# Patient Record
Sex: Female | Born: 1940 | Race: White | Hispanic: No | Smoking: Former smoker
Health system: Southern US, Community
[De-identification: ages and names within clinical notes are randomized; demographics above are authoritative.]

## PROBLEM LIST (undated history)

## (undated) DIAGNOSIS — R42 Dizziness and giddiness: Secondary | ICD-10-CM

## (undated) DIAGNOSIS — C44311 Basal cell carcinoma of skin of nose: Secondary | ICD-10-CM

## (undated) DIAGNOSIS — G43909 Migraine, unspecified, not intractable, without status migrainosus: Secondary | ICD-10-CM

## (undated) DIAGNOSIS — G629 Polyneuropathy, unspecified: Secondary | ICD-10-CM

## (undated) DIAGNOSIS — M797 Fibromyalgia: Secondary | ICD-10-CM

## (undated) DIAGNOSIS — S0590XA Unspecified injury of unspecified eye and orbit, initial encounter: Secondary | ICD-10-CM

## (undated) DIAGNOSIS — I10 Essential (primary) hypertension: Secondary | ICD-10-CM

## (undated) DIAGNOSIS — G8929 Other chronic pain: Secondary | ICD-10-CM

## (undated) DIAGNOSIS — M542 Cervicalgia: Secondary | ICD-10-CM

## (undated) DIAGNOSIS — R001 Bradycardia, unspecified: Secondary | ICD-10-CM

## (undated) DIAGNOSIS — M549 Dorsalgia, unspecified: Secondary | ICD-10-CM

## (undated) DIAGNOSIS — E785 Hyperlipidemia, unspecified: Secondary | ICD-10-CM

## (undated) DIAGNOSIS — I639 Cerebral infarction, unspecified: Secondary | ICD-10-CM

## (undated) DIAGNOSIS — F329 Major depressive disorder, single episode, unspecified: Secondary | ICD-10-CM

## (undated) DIAGNOSIS — R0789 Other chest pain: Secondary | ICD-10-CM

## (undated) DIAGNOSIS — R079 Chest pain, unspecified: Secondary | ICD-10-CM

## (undated) DIAGNOSIS — F32A Depression, unspecified: Secondary | ICD-10-CM

## (undated) DIAGNOSIS — E041 Nontoxic single thyroid nodule: Secondary | ICD-10-CM

## (undated) DIAGNOSIS — F419 Anxiety disorder, unspecified: Secondary | ICD-10-CM

## (undated) DIAGNOSIS — E049 Nontoxic goiter, unspecified: Secondary | ICD-10-CM

## (undated) HISTORY — DX: Migraine, unspecified, not intractable, without status migrainosus: G43.909

## (undated) HISTORY — DX: Anxiety disorder, unspecified: F41.9

## (undated) HISTORY — DX: Chest pain, unspecified: R07.9

## (undated) HISTORY — DX: Dizziness and giddiness: R42

## (undated) HISTORY — DX: Fibromyalgia: M79.7

## (undated) HISTORY — DX: Bradycardia, unspecified: R00.1

## (undated) HISTORY — DX: Other chronic pain: G89.29

## (undated) HISTORY — PX: CYSTECTOMY: SUR359

## (undated) HISTORY — PX: ABDOMINAL HYSTERECTOMY: SHX81

## (undated) HISTORY — PX: JOINT REPLACEMENT: SHX530

## (undated) HISTORY — PX: ROTATOR CUFF REPAIR: SHX139

## (undated) HISTORY — PX: TOTAL KNEE ARTHROPLASTY: SHX125

## (undated) HISTORY — DX: Other chest pain: R07.89

## (undated) HISTORY — PX: CHOLECYSTECTOMY: SHX55

## (undated) HISTORY — DX: Cerebral infarction, unspecified: I63.9

## (undated) HISTORY — PX: BICEPS TENDON REPAIR: SHX566

## (undated) HISTORY — PX: VESICOVAGINAL FISTULA CLOSURE W/ TAH: SUR271

## (undated) HISTORY — PX: CARDIAC CATHETERIZATION: SHX172

## (undated) HISTORY — PX: APPENDECTOMY: SHX54

## (undated) HISTORY — PX: BREAST LUMPECTOMY: SHX2

## (undated) HISTORY — PX: LASIK: SHX215

## (undated) HISTORY — DX: Polyneuropathy, unspecified: G62.9

## (undated) HISTORY — PX: REFRACTIVE SURGERY: SHX103

---

## 1998-08-20 DIAGNOSIS — I639 Cerebral infarction, unspecified: Secondary | ICD-10-CM

## 1998-08-20 HISTORY — DX: Cerebral infarction, unspecified: I63.9

## 2005-11-22 ENCOUNTER — Emergency Department (HOSPITAL_COMMUNITY): Admission: EM | Admit: 2005-11-22 | Discharge: 2005-11-22 | Payer: Self-pay | Admitting: Family Medicine

## 2005-11-23 ENCOUNTER — Emergency Department (HOSPITAL_COMMUNITY): Admission: EM | Admit: 2005-11-23 | Discharge: 2005-11-23 | Payer: Self-pay | Admitting: Family Medicine

## 2005-11-23 ENCOUNTER — Ambulatory Visit (HOSPITAL_COMMUNITY): Admission: RE | Admit: 2005-11-23 | Discharge: 2005-11-23 | Payer: Self-pay | Admitting: Family Medicine

## 2006-09-18 ENCOUNTER — Ambulatory Visit (HOSPITAL_COMMUNITY): Admission: RE | Admit: 2006-09-18 | Discharge: 2006-09-18 | Payer: Self-pay | Admitting: General Practice

## 2007-09-10 ENCOUNTER — Emergency Department (HOSPITAL_COMMUNITY): Admission: EM | Admit: 2007-09-10 | Discharge: 2007-09-10 | Payer: Self-pay | Admitting: Family Medicine

## 2007-10-07 ENCOUNTER — Ambulatory Visit (HOSPITAL_COMMUNITY): Admission: RE | Admit: 2007-10-07 | Discharge: 2007-10-07 | Payer: Self-pay | Admitting: Orthopedic Surgery

## 2007-12-31 ENCOUNTER — Ambulatory Visit (HOSPITAL_COMMUNITY): Payer: Self-pay | Admitting: Psychiatry

## 2008-01-23 ENCOUNTER — Ambulatory Visit (HOSPITAL_COMMUNITY): Payer: Self-pay | Admitting: Psychiatry

## 2008-02-12 ENCOUNTER — Ambulatory Visit (HOSPITAL_COMMUNITY): Payer: Self-pay | Admitting: Licensed Clinical Social Worker

## 2008-02-18 ENCOUNTER — Ambulatory Visit (HOSPITAL_COMMUNITY): Payer: Self-pay | Admitting: Psychiatry

## 2008-03-17 ENCOUNTER — Ambulatory Visit (HOSPITAL_COMMUNITY): Payer: Self-pay | Admitting: Licensed Clinical Social Worker

## 2008-03-17 ENCOUNTER — Ambulatory Visit (HOSPITAL_COMMUNITY): Payer: Self-pay | Admitting: Psychiatry

## 2008-04-08 ENCOUNTER — Ambulatory Visit (HOSPITAL_COMMUNITY): Payer: Self-pay | Admitting: Licensed Clinical Social Worker

## 2008-04-28 ENCOUNTER — Ambulatory Visit (HOSPITAL_COMMUNITY): Payer: Self-pay | Admitting: Psychiatry

## 2008-06-28 ENCOUNTER — Ambulatory Visit (HOSPITAL_COMMUNITY): Payer: Self-pay | Admitting: Psychiatry

## 2009-02-09 ENCOUNTER — Encounter: Admission: RE | Admit: 2009-02-09 | Discharge: 2009-05-10 | Payer: Self-pay | Admitting: Orthopedic Surgery

## 2009-03-03 ENCOUNTER — Encounter: Admission: RE | Admit: 2009-03-03 | Discharge: 2009-03-03 | Payer: Self-pay | Admitting: Orthopedic Surgery

## 2009-05-30 ENCOUNTER — Ambulatory Visit: Payer: Self-pay | Admitting: Internal Medicine

## 2009-07-03 ENCOUNTER — Emergency Department (HOSPITAL_COMMUNITY): Admission: EM | Admit: 2009-07-03 | Discharge: 2009-07-03 | Payer: Self-pay | Admitting: Emergency Medicine

## 2009-09-17 ENCOUNTER — Encounter: Admission: RE | Admit: 2009-09-17 | Discharge: 2009-09-17 | Payer: Self-pay | Admitting: Neurology

## 2009-12-16 ENCOUNTER — Encounter: Admission: RE | Admit: 2009-12-16 | Discharge: 2009-12-16 | Payer: Self-pay | Admitting: Neurology

## 2010-09-27 ENCOUNTER — Emergency Department (HOSPITAL_COMMUNITY): Payer: Medicare Other

## 2010-09-27 ENCOUNTER — Emergency Department (HOSPITAL_COMMUNITY)
Admission: EM | Admit: 2010-09-27 | Discharge: 2010-09-27 | Disposition: A | Discharge status: Home / Self Care | Payer: Medicare Other | Attending: Emergency Medicine | Admitting: Emergency Medicine

## 2010-09-27 DIAGNOSIS — W06XXXA Fall from bed, initial encounter: Secondary | ICD-10-CM | POA: Insufficient documentation

## 2010-09-27 DIAGNOSIS — M25579 Pain in unspecified ankle and joints of unspecified foot: Secondary | ICD-10-CM | POA: Insufficient documentation

## 2010-09-27 DIAGNOSIS — Z794 Long term (current) use of insulin: Secondary | ICD-10-CM | POA: Insufficient documentation

## 2010-09-27 DIAGNOSIS — M25559 Pain in unspecified hip: Secondary | ICD-10-CM | POA: Insufficient documentation

## 2010-09-27 DIAGNOSIS — R110 Nausea: Secondary | ICD-10-CM | POA: Insufficient documentation

## 2010-09-27 DIAGNOSIS — I10 Essential (primary) hypertension: Secondary | ICD-10-CM | POA: Insufficient documentation

## 2010-09-27 DIAGNOSIS — M25569 Pain in unspecified knee: Secondary | ICD-10-CM | POA: Insufficient documentation

## 2010-09-27 DIAGNOSIS — Z8679 Personal history of other diseases of the circulatory system: Secondary | ICD-10-CM | POA: Insufficient documentation

## 2010-09-27 DIAGNOSIS — R079 Chest pain, unspecified: Secondary | ICD-10-CM | POA: Insufficient documentation

## 2010-09-27 DIAGNOSIS — M5030 Other cervical disc degeneration, unspecified cervical region: Secondary | ICD-10-CM | POA: Insufficient documentation

## 2010-09-27 DIAGNOSIS — G319 Degenerative disease of nervous system, unspecified: Secondary | ICD-10-CM | POA: Insufficient documentation

## 2010-09-27 DIAGNOSIS — S9000XA Contusion of unspecified ankle, initial encounter: Secondary | ICD-10-CM | POA: Insufficient documentation

## 2010-09-27 DIAGNOSIS — S8000XA Contusion of unspecified knee, initial encounter: Secondary | ICD-10-CM | POA: Insufficient documentation

## 2010-09-27 DIAGNOSIS — S20219A Contusion of unspecified front wall of thorax, initial encounter: Secondary | ICD-10-CM | POA: Insufficient documentation

## 2010-09-27 DIAGNOSIS — M25519 Pain in unspecified shoulder: Secondary | ICD-10-CM | POA: Insufficient documentation

## 2010-09-27 DIAGNOSIS — E119 Type 2 diabetes mellitus without complications: Secondary | ICD-10-CM | POA: Insufficient documentation

## 2010-10-03 ENCOUNTER — Emergency Department (HOSPITAL_COMMUNITY): Payer: Medicare Other

## 2010-10-03 ENCOUNTER — Observation Stay (HOSPITAL_COMMUNITY)
Admission: EM | Admit: 2010-10-03 | Discharge: 2010-10-06 | Disposition: A | Discharge status: Home Health | Payer: Medicare Other | Attending: Internal Medicine | Admitting: Internal Medicine

## 2010-10-03 DIAGNOSIS — F29 Unspecified psychosis not due to a substance or known physiological condition: Secondary | ICD-10-CM | POA: Insufficient documentation

## 2010-10-03 DIAGNOSIS — R0789 Other chest pain: Principal | ICD-10-CM | POA: Insufficient documentation

## 2010-10-03 DIAGNOSIS — R109 Unspecified abdominal pain: Secondary | ICD-10-CM | POA: Insufficient documentation

## 2010-10-03 DIAGNOSIS — T40605A Adverse effect of unspecified narcotics, initial encounter: Secondary | ICD-10-CM | POA: Insufficient documentation

## 2010-10-03 DIAGNOSIS — Z9181 History of falling: Secondary | ICD-10-CM | POA: Insufficient documentation

## 2010-10-03 DIAGNOSIS — E785 Hyperlipidemia, unspecified: Secondary | ICD-10-CM | POA: Insufficient documentation

## 2010-10-03 DIAGNOSIS — IMO0001 Reserved for inherently not codable concepts without codable children: Secondary | ICD-10-CM | POA: Insufficient documentation

## 2010-10-03 DIAGNOSIS — Y92009 Unspecified place in unspecified non-institutional (private) residence as the place of occurrence of the external cause: Secondary | ICD-10-CM | POA: Insufficient documentation

## 2010-10-03 DIAGNOSIS — E119 Type 2 diabetes mellitus without complications: Secondary | ICD-10-CM | POA: Insufficient documentation

## 2010-10-03 DIAGNOSIS — Z8673 Personal history of transient ischemic attack (TIA), and cerebral infarction without residual deficits: Secondary | ICD-10-CM | POA: Insufficient documentation

## 2010-10-03 DIAGNOSIS — Z4789 Encounter for other orthopedic aftercare: Secondary | ICD-10-CM | POA: Insufficient documentation

## 2010-10-03 DIAGNOSIS — Z794 Long term (current) use of insulin: Secondary | ICD-10-CM | POA: Insufficient documentation

## 2010-10-03 DIAGNOSIS — R4182 Altered mental status, unspecified: Secondary | ICD-10-CM | POA: Insufficient documentation

## 2010-10-03 DIAGNOSIS — R112 Nausea with vomiting, unspecified: Secondary | ICD-10-CM | POA: Insufficient documentation

## 2010-10-03 DIAGNOSIS — I10 Essential (primary) hypertension: Secondary | ICD-10-CM | POA: Insufficient documentation

## 2010-10-03 LAB — DIFFERENTIAL
Basophils Absolute: 0 10*3/uL (ref 0.0–0.1)
Basophils Absolute: 0 10*3/uL (ref 0.0–0.1)
Basophils Relative: 0 % (ref 0–1)
Basophils Relative: 0 % (ref 0–1)
Lymphocytes Relative: 11 % — ABNORMAL LOW (ref 12–46)
Monocytes Absolute: 0.4 10*3/uL (ref 0.1–1.0)
Neutro Abs: 9.6 10*3/uL — ABNORMAL HIGH (ref 1.7–7.7)
Neutro Abs: 7.1 10*3/uL (ref 1.7–7.7)
Neutrophils Relative %: 90 % — ABNORMAL HIGH (ref 43–77)
Neutrophils Relative %: 82 % — ABNORMAL HIGH (ref 43–77)

## 2010-10-03 LAB — TSH: TSH: 0.785 u[IU]/mL (ref 0.350–4.500)

## 2010-10-03 LAB — CBC
HCT: 40.8 % (ref 36.0–46.0)
Hemoglobin: 13.8 g/dL (ref 12.0–15.0)
MCH: 31.8 pg (ref 26.0–34.0)
Platelets: 256 10*3/uL (ref 150–400)
RBC: 4.66 MIL/uL (ref 3.87–5.11)
WBC: 10.6 10*3/uL — ABNORMAL HIGH (ref 4.0–10.5)
WBC: 8.6 10*3/uL (ref 4.0–10.5)

## 2010-10-03 LAB — COMPREHENSIVE METABOLIC PANEL
ALT: 11 U/L (ref 0–35)
Alkaline Phosphatase: 86 U/L (ref 39–117)
BUN: 12 mg/dL (ref 6–23)
CO2: 30 mEq/L (ref 19–32)
Calcium: 8.5 mg/dL (ref 8.4–10.5)
GFR calc non Af Amer: >60 mL/min (ref >60)
Glucose, Bld: 178 mg/dL — ABNORMAL HIGH (ref 70–99)
Potassium: 4.4 mEq/L (ref 3.5–5.1)
Sodium: 144 mEq/L (ref 135–145)
Total Protein: 5.7 g/dL — ABNORMAL LOW (ref 6.0–8.3)

## 2010-10-03 LAB — POCT I-STAT, CHEM 8
BUN: 19 mg/dL (ref 6–23)
Calcium, Ion: 1.13 mmol/L (ref 1.12–1.32)
Creatinine, Ser: 0.7 mg/dL (ref 0.4–1.2)
Sodium: 142 mEq/L (ref 135–145)

## 2010-10-03 LAB — RAPID URINE DRUG SCREEN, HOSP PERFORMED
Barbiturates: NOT DETECTED
Benzodiazepines: POSITIVE — AB
Cocaine: NOT DETECTED
Opiates: NOT DETECTED

## 2010-10-03 LAB — POCT CARDIAC MARKERS
CKMB, poc: <1 ng/mL — ABNORMAL LOW (ref 1.0–8.0)
Myoglobin, poc: 57.7 ng/mL (ref 12–200)
Myoglobin, poc: 64.5 ng/mL (ref 12–200)
Troponin i, poc: <0.05 ng/mL (ref 0.00–0.09)

## 2010-10-03 LAB — CK TOTAL AND CKMB (NOT AT ARMC)
CK, MB: 0.9 ng/mL (ref 0.3–4.0)
Relative Index: INVALID (ref 0.0–2.5)
Total CK: 52 U/L (ref 7–177)

## 2010-10-03 LAB — URINALYSIS, ROUTINE W REFLEX MICROSCOPIC
Bilirubin Urine: NEGATIVE
Ketones, ur: NEGATIVE mg/dL
Nitrite: NEGATIVE
Protein, ur: NEGATIVE mg/dL
Specific Gravity, Urine: 1.01 (ref 1.005–1.030)
Urobilinogen, UA: 0.2 mg/dL (ref 0.0–1.0)

## 2010-10-03 LAB — GLUCOSE, CAPILLARY: Glucose-Capillary: 223 mg/dL — ABNORMAL HIGH (ref 70–99)

## 2010-10-03 LAB — PROTIME-INR: INR: 0.94 (ref 0.00–1.49)

## 2010-10-03 LAB — CARDIAC PANEL(CRET KIN+CKTOT+MB+TROPI)
Relative Index: INVALID (ref 0.0–2.5)
Troponin I: <0.01 ng/mL (ref 0.00–0.06)

## 2010-10-03 LAB — LIPID PANEL
HDL: 48 mg/dL (ref >39)
Triglycerides: 52 mg/dL (ref <150)
VLDL: 10 mg/dL (ref 0–40)

## 2010-10-03 LAB — HEMOGLOBIN A1C: Hgb A1c MFr Bld: 7.9 % — ABNORMAL HIGH (ref <5.7)

## 2010-10-04 ENCOUNTER — Observation Stay (HOSPITAL_COMMUNITY): Payer: Medicare Other

## 2010-10-04 LAB — GLUCOSE, CAPILLARY
Glucose-Capillary: 134 mg/dL — ABNORMAL HIGH (ref 70–99)
Glucose-Capillary: 135 mg/dL — ABNORMAL HIGH (ref 70–99)
Glucose-Capillary: 220 mg/dL — ABNORMAL HIGH (ref 70–99)
Glucose-Capillary: 146 mg/dL — ABNORMAL HIGH (ref 70–99)
Glucose-Capillary: 176 mg/dL — ABNORMAL HIGH (ref 70–99)

## 2010-10-04 LAB — CARDIAC PANEL(CRET KIN+CKTOT+MB+TROPI)
CK, MB: 1 ng/mL (ref 0.3–4.0)
Relative Index: INVALID (ref 0.0–2.5)
Total CK: 51 U/L (ref 7–177)

## 2010-10-05 LAB — CBC
HCT: 42 % (ref 36.0–46.0)
MCHC: 33.1 g/dL (ref 30.0–36.0)
MCV: 95 fL (ref 78.0–100.0)
RDW: 12.9 % (ref 11.5–15.5)

## 2010-10-05 LAB — GLUCOSE, CAPILLARY: Glucose-Capillary: 109 mg/dL — ABNORMAL HIGH (ref 70–99)

## 2010-10-05 LAB — BASIC METABOLIC PANEL
BUN: 8 mg/dL (ref 6–23)
CO2: 31 mEq/L (ref 19–32)
Calcium: 8.5 mg/dL (ref 8.4–10.5)
GFR calc non Af Amer: >60 mL/min (ref >60)
Glucose, Bld: 168 mg/dL — ABNORMAL HIGH (ref 70–99)
Potassium: 4 mEq/L (ref 3.5–5.1)

## 2010-10-05 MED ORDER — IOHEXOL 300 MG/ML  SOLN
100.0000 mL | Freq: Once | INTRAMUSCULAR | Status: AC | PRN
  Administered 2010-10-05: 100 mL via INTRAVENOUS

## 2010-10-09 LAB — GLUCOSE, CAPILLARY: Glucose-Capillary: 298 mg/dL — ABNORMAL HIGH (ref 70–99)

## 2010-10-14 NOTE — Discharge Summary (Signed)
NAME:  Margaret Fischer, Margaret Fischer                 ACCOUNT NO.:  [id number]  MEDICAL RECORD NO.:  [id number]           PATIENT TYPE:  I  LOCATION:  3005                         FACILITY:  [facility]  PHYSICIAN:  Isidor Holts, M.D.  DATE OF BIRTH:  1940/09/16  DATE OF ADMISSION:  10/03/2010 DATE OF DISCHARGE:  10/06/2010                              DISCHARGE SUMMARY   PRIMARY PHYSICIAN:  Gerlene Burdock D. Ethelene Hal, M.D.  PRIMARY NEUROLOGIST:  Genene Churn. Love, M.D.  DISCHARGE DIAGNOSES: 1. Transient altered mental status/disorientation, likely secondary to     medication side effect. 2. History of recent fall/left lower rib cage pain, likely secondary     to rib contusion. 3. Insulin-requiring type 2 diabetes mellitus. 4. Dyslipidemia. 5. Hypertension. 6. History of transient ischemic attack. 7. Fibromyalgia/chronic pain syndrome. 8. Morbid obesity.  DISCHARGE MEDICATIONS: 1. Fluoxetine 60 mg p.o. daily. 2. Amlodipine 5 mg p.o. daily. 3. Aspirin 325 mg p.o. b.i.d. 4. Clonazepam 2 mg p.o. p.r.n. t.i.d. for anxiety. 5. Vitamin B12 OTC 1 tablet p.o. daily. 6. Furosemide 40 mg p.o. daily. 7. Gabapentin 600 mg p.o. t.i.d. 8. Glucosamine 2 g p.o. daily. 9. Lantus insulin 14 subcutaneously at bedtime. 10.Metformin 1000 mg p.o. b.i.d. 11.Percocet (5/325) 1 p.o. p.r.n. q.4 h. for pain. 12.Plavix 75 mg p.o. daily. 13.Potassium chloride 20 mEq p.o. daily. 14.Pravachol 40 mg p.o. daily. 15.Vitamin D OTC 2 tablets p.o. daily. 16.Vitamin D3 3000 units p.o. daily.  PROCEDURES: 1. Chest x-ray October 03, 2010.  This showed cardiomegaly with mild     bibasilar atelectasis/infiltrative changes. 2. Head CT scan October 03, 2010.  This showed atrophy, no acute     intracranial pathology. 3. Brain MRI October 04, 2010.  This showed left intracranial     abnormality there was stable scattered white matter disease.  The     finding is nonspecific. 4. Chest CT angiogram October 05, 2010.  This was negative for     pulmonary embolism or acute finding.  No fracture identified.  Mild     cardiomegaly, acute left adrenal adenoma. 5. A 2-D echocardiogram October 05, 2010.  This showed normal left     ventricular cavity size, moderate concentric hypertrophy, ejection     fraction 55% to 60%, regional wall motion abnormalities.  Left     atrium was mildly dilated, small to moderate pericardial effusion     was identified circumferential to the heart.  CONSULTATIONS:  None.  ADMISSION HISTORY:  As per H and P notes of October 03, 2010, dictated by Dr. Ladell Pier.  In brief, this is a 70 year old female, with known history of hypertension, insulin-requiring type 2 diabetes mellitus, dyslipidemia, fibromyalgia syndrome, TIA, chronic pain syndrome, morbid obesity, dyslipidemia, presenting with altered mental status described as disorientation and also chest pain, i.e., left lower rib cage area.  She was subsequently admitted for further evaluation, investigation, and management.  CLINICAL COURSE: 1. Altered mental status.  The patient was described as being     disoriented.  She is currently on a combination of opiate     analgesics as well as benzodiazepines.  These were temporarily  held,     as it was felt that the combination may be contributory to her     altered mental status.  Mental status over the next day or two,     reverted to baseline.  However, because of continuing left-sided     chest pain, we gingerly reintroduced the patient on opioid medications     without any side effect.  2. Left lower rib cage pain.  This is likely secondary to the rib     contusion.  The patient reportedly had a recent fall and since then,     has had this pain, which is musculoskeletal as evidenced by     localized tenderness in the area on palpation.  Imaging studies, however,     including chest x-ray as well as chest CT scan revealed no evidence     of fracture.  3. Type 2 diabetes mellitus.  This  was managed with sliding scale     insulin coverage and scheduled Lantus, as well as carbohydrate-modified diet,     with reasonable control.  4. Dyslipidemia.  The patient's lipid profile is as follows, total     cholesterol 108, triglyceride 58, HDL 42, LDL 50, i.e. excellent     lipid profile.  She has been reassured accordingly.  5. Hypertension.  The patient remained normotensive during the course     of hospitalization.  6. Fibromyalgia syndrome.  This did not prove problematic.  DISPOSITION:  The patient's 12-lead EKG showed no acute ischemic changes.  Cardiac enzymes remained unelevated as described above.  Chest pain is noncardiac.  The patient as of October 06, 2010, had significantly clinically improved, so she was considered clinically stable for discharge, and was discharged accordingly.  She was evaluated by PT, OT who recommended continued home health PT as well as vestibular rehab. This has been arranged.  DIET:  Heart-healthy/carbohydrate modified.  ACTIVITY:  As tolerated.  Recommended to increase activity slowly, otherwise per H and P.  FOLLOWUP INSTRUCTIONS:  The patient is to follow up routinely with her primary cardiologist, Dr. Elijah Birk, with her primary MD, Dr. Ethelene Hal, and also with her neurologist, Dr. Avie Echevaria.     Isidor Holts, M.D.     CO/MEDQ  D:  10/06/2010  T:  10/07/2010  Job:  [id number]  cc:   Johnnye Lana. Ethelene Hal, M.D. Genene Churn. Love, M.D.  Electronically Signed by Isidor Holts M.D. on 10/14/2010 01:25:27 PM

## 2010-10-27 NOTE — H&P (Signed)
NAME:  Margaret Fischer, Margaret Fischer                 ACCOUNT NO.:  [id number]  MEDICAL RECORD NO.:  [id number]           PATIENT TYPE:  E  LOCATION:  [facility]                         FACILITY:  [facility]  PHYSICIAN:  Ladell Pier, M.D.   DATE OF BIRTH:  05-Dec-1940  DATE OF ADMISSION:  10/03/2010 DATE OF DISCHARGE:                             HISTORY & PHYSICAL   CHIEF COMPLAINT:  Altered mental status and chest pain.  HISTORY OF PRESENT ILLNESS:  The patient is a 70 year old white female with past medical history significant for diabetes, hypertension, dyslipidemia, fibromyalgia, TIA.  Per the patient's son who gave lot of the history in addition to the patient, the patient woke up this morning at about 5:30, took half or quarter Percocet and then he noticed that the patient was very confused, not really slurred speech, she was just a little bit, did not know where she was, the year, the date.  She also was complaining of chest pain.  She stated that she had some pressure on her chest but she recently fell a few days ago, she was taken to the emergency room, she had x-rays done and she was told that she had a bruised rib.  It hurts whenever she holds on to her rib area.  She also has fibromyalgia and chronic pain for which she sees Dr. Ethelene Hal.  Her son was thinking that it could be secondary to the narcotics why she was confused.  She did not have any shortness of breath.  The pain kind of radiated to her back and she complained of some palpitations, otherwise she felt fine.  She has had chest pain in the past.  She had a cardiac cath done per her cardiologist, Dr. Elijah Birk and it was negative about a year and half ago and she was given nitroglycerin sublingual to use p.r.n. if she does have chest pain.  The patient stated that when this chest pain came on, she used nitroglycerin sublingual without any improvement.  FAMILY HISTORY:  Both parents are deceased.  Mother had lymphoma and father died in a  plane crash.  SOCIAL HISTORY:  She had a remote tobacco history, quit back 26 years, now she does not drink alcohol.  She is divorced.  She has 2 children. She was on disability and now she is on [employer].  MEDICATIONS: 1. Vitamin C daily. 2. Prozac 60 mg daily. 3. Percocet 5/325 every 4 hours as needed. 4. Plavix 75 mg daily. 5. Potassium chloride 20 mEq daily. 6. Glucosamine 2000 mg daily. 7. Vitamin D3 3000 units daily. 8. Vitamin B 2 tablets daily. 9. Vitamin B12 daily. 10.Aspirin 325 mg twice daily. 11.Gabapentin 600 mg 3 times daily. 12.Clonazepam 2 mg 1 tablet 3 times daily as needed. 13.Amlodipine 5 mg daily. 14.Lasix 40 mg daily. 15.Lantus 40 units at bedtime. 16.Metformin 500 mg twice daily.  ALLERGIES:  PROMETHAZINE, MORPHINE, HYDROMORPHONE, EPINEPHRINE, SUCCINYLCHOLINE.  REVIEW OF SYSTEMS:  Negative otherwise stated in the HPI.  PHYSICAL EXAMINATION:  VITAL SIGNS:  Temperature 97.5, pulse 76, respirations 16, blood pressure 131/62, pulse ox 95% on room air. GENERAL:  The patient  is lying on stretcher, well-nourished white female. HEENT:  Normocephalic, atraumatic.  Pupils are reactive to light. Throat is without erythema. CARDIOVASCULAR:  Regular rate and rhythm. LUNGS:  Clear bilaterally. ABDOMEN:  Positive bowel sounds. EXTREMITIES:  Without edema. NEUROLOGIC:  Nonfocal.  Cranial nerves II-XII intact.  LABORATORY DATA:  Myoglobin 64.5, troponin less than 0.01, ammonia level 29, alcohol less than 5.  UDS positive for benzos.  Urinalysis negative. CT scan of the head, atrophy, no acute abnormality.  Chest x-ray, cardiomegaly with mild basilar atelectasis.  EKG results pending.  ASSESSMENT/PLAN: 1. Altered mental status. 2. Chest pain. 3. Hypertension, diabetes, dyslipidemia.  We will admit the patient to     the hospital.  I suspect her altered mental status could be related     to her narcotics and benzos based on discussion with her son but      will rule out other etiologies of altered mental status. 4. With her chest pain, she just had a rib fracture.  She stated when     she took a deep breath, she has to hold it and then it does not     hurt, most likely is pain from the rib fracture. 5. With her diabetes, her blood sugar now is 173, so we will decrease     her Lantus and then can be increased later when her blood sugars     improve with diet.  Hypertension and blood pressures is stable.  We     will cycle cardiac markers and get MRI of her head, get 2-D echo.     We will get PT to ambulate her.  We will check hemoglobin A1c.     Time spent with the patient doing this admission is approximately     45 minutes.     Ladell Pier, M.D.     [place]/MEDQ  D:  10/03/2010  T:  10/03/2010  Job:  [id number]  cc:   Rosamaria Lints D. Ethelene Hal, M.D. Genene Churn. Love, M.D.  Electronically Signed by Ladell Pier M.D. on 10/26/2010 07:57:23 AM

## 2010-12-12 ENCOUNTER — Emergency Department (HOSPITAL_COMMUNITY): Payer: Medicare Other

## 2010-12-12 ENCOUNTER — Observation Stay (HOSPITAL_COMMUNITY)
Admission: EM | Admit: 2010-12-12 | Discharge: 2010-12-15 | Disposition: A | Discharge status: Home / Self Care | Payer: Medicare Other | Attending: Family Medicine | Admitting: Family Medicine

## 2010-12-12 DIAGNOSIS — R0789 Other chest pain: Secondary | ICD-10-CM

## 2010-12-12 DIAGNOSIS — G894 Chronic pain syndrome: Secondary | ICD-10-CM | POA: Insufficient documentation

## 2010-12-12 DIAGNOSIS — R55 Syncope and collapse: Secondary | ICD-10-CM | POA: Insufficient documentation

## 2010-12-12 DIAGNOSIS — I498 Other specified cardiac arrhythmias: Secondary | ICD-10-CM

## 2010-12-12 DIAGNOSIS — Z79899 Other long term (current) drug therapy: Secondary | ICD-10-CM | POA: Insufficient documentation

## 2010-12-12 DIAGNOSIS — R42 Dizziness and giddiness: Secondary | ICD-10-CM | POA: Insufficient documentation

## 2010-12-12 DIAGNOSIS — I10 Essential (primary) hypertension: Secondary | ICD-10-CM | POA: Insufficient documentation

## 2010-12-12 DIAGNOSIS — E785 Hyperlipidemia, unspecified: Secondary | ICD-10-CM | POA: Insufficient documentation

## 2010-12-12 DIAGNOSIS — Z8673 Personal history of transient ischemic attack (TIA), and cerebral infarction without residual deficits: Secondary | ICD-10-CM | POA: Insufficient documentation

## 2010-12-12 DIAGNOSIS — IMO0001 Reserved for inherently not codable concepts without codable children: Secondary | ICD-10-CM

## 2010-12-12 DIAGNOSIS — E119 Type 2 diabetes mellitus without complications: Secondary | ICD-10-CM | POA: Insufficient documentation

## 2010-12-12 DIAGNOSIS — Z7902 Long term (current) use of antithrombotics/antiplatelets: Secondary | ICD-10-CM | POA: Insufficient documentation

## 2010-12-12 DIAGNOSIS — Z794 Long term (current) use of insulin: Secondary | ICD-10-CM | POA: Insufficient documentation

## 2010-12-12 DIAGNOSIS — R079 Chest pain, unspecified: Principal | ICD-10-CM | POA: Insufficient documentation

## 2010-12-12 DIAGNOSIS — Z96659 Presence of unspecified artificial knee joint: Secondary | ICD-10-CM | POA: Insufficient documentation

## 2010-12-12 HISTORY — DX: Essential (primary) hypertension: I10

## 2010-12-12 LAB — COMPREHENSIVE METABOLIC PANEL
Albumin: 3.4 g/dL — ABNORMAL LOW (ref 3.5–5.2)
BUN: 9 mg/dL (ref 6–23)
Calcium: 8.8 mg/dL (ref 8.4–10.5)
Creatinine, Ser: 0.53 mg/dL (ref 0.4–1.2)
Glucose, Bld: 130 mg/dL — ABNORMAL HIGH (ref 70–99)
Potassium: 4.3 mEq/L (ref 3.5–5.1)
Total Protein: 5.7 g/dL — ABNORMAL LOW (ref 6.0–8.3)

## 2010-12-12 LAB — CBC
Hemoglobin: 14.1 g/dL (ref 12.0–15.0)
MCH: 31.8 pg (ref 26.0–34.0)
Platelets: 240 10*3/uL (ref 150–400)
RBC: 4.43 MIL/uL (ref 3.87–5.11)
WBC: 6.6 10*3/uL (ref 4.0–10.5)

## 2010-12-12 LAB — GLUCOSE, CAPILLARY
Glucose-Capillary: 119 mg/dL — ABNORMAL HIGH (ref 70–99)
Glucose-Capillary: 74 mg/dL (ref 70–99)

## 2010-12-12 LAB — POCT CARDIAC MARKERS
CKMB, poc: <1 ng/mL — ABNORMAL LOW (ref 1.0–8.0)
Myoglobin, poc: 69.9 ng/mL (ref 12–200)
Troponin i, poc: <0.05 ng/mL (ref 0.00–0.09)

## 2010-12-12 LAB — CK TOTAL AND CKMB (NOT AT ARMC)
Relative Index: INVALID (ref 0.0–2.5)
Total CK: 55 U/L (ref 7–177)

## 2010-12-12 LAB — DIFFERENTIAL
Basophils Relative: 0 % (ref 0–1)
Monocytes Relative: 7 % (ref 3–12)
Neutro Abs: 4.3 10*3/uL (ref 1.7–7.7)
Neutrophils Relative %: 65 % (ref 43–77)

## 2010-12-12 LAB — TROPONIN I: Troponin I: 0.01 ng/mL (ref 0.00–0.06)

## 2010-12-13 DIAGNOSIS — I319 Disease of pericardium, unspecified: Secondary | ICD-10-CM

## 2010-12-13 DIAGNOSIS — I498 Other specified cardiac arrhythmias: Secondary | ICD-10-CM

## 2010-12-13 DIAGNOSIS — R079 Chest pain, unspecified: Secondary | ICD-10-CM

## 2010-12-13 LAB — BASIC METABOLIC PANEL
BUN: 11 mg/dL (ref 6–23)
CO2: 32 mEq/L (ref 19–32)
Calcium: 8.7 mg/dL (ref 8.4–10.5)
Chloride: 103 mEq/L (ref 96–112)
GFR calc Af Amer: >60 mL/min (ref >60)
GFR calc non Af Amer: >60 mL/min (ref >60)
Glucose, Bld: 285 mg/dL — ABNORMAL HIGH (ref 70–99)
Glucose, Bld: 155 mg/dL — ABNORMAL HIGH (ref 70–99)
Potassium: 4.3 mEq/L (ref 3.5–5.1)
Sodium: 138 mEq/L (ref 135–145)
Sodium: 141 mEq/L (ref 135–145)

## 2010-12-13 LAB — CARDIAC PANEL(CRET KIN+CKTOT+MB+TROPI)
CK, MB: 1.3 ng/mL (ref 0.3–4.0)
CK, MB: 1.3 ng/mL (ref 0.3–4.0)
Total CK: 52 U/L (ref 7–177)
Total CK: 52 U/L (ref 7–177)
Troponin I: 0.03 ng/mL (ref 0.00–0.06)

## 2010-12-13 LAB — CBC
HCT: 40.4 % (ref 36.0–46.0)
HCT: 40.3 % (ref 36.0–46.0)
Hemoglobin: 13.4 g/dL (ref 12.0–15.0)
MCHC: 33.2 g/dL (ref 30.0–36.0)
Platelets: 244 10*3/uL (ref 150–400)
RBC: 4.23 MIL/uL (ref 3.87–5.11)
RDW: 12.6 % (ref 11.5–15.5)
WBC: 6.2 10*3/uL (ref 4.0–10.5)

## 2010-12-13 LAB — GLUCOSE, CAPILLARY: Glucose-Capillary: 185 mg/dL — ABNORMAL HIGH (ref 70–99)

## 2010-12-13 LAB — TSH: TSH: 2.372 u[IU]/mL (ref 0.350–4.500)

## 2010-12-14 ENCOUNTER — Observation Stay (HOSPITAL_COMMUNITY): Payer: Medicare Other

## 2010-12-14 ENCOUNTER — Encounter (HOSPITAL_COMMUNITY): Payer: Self-pay | Admitting: Radiology

## 2010-12-14 DIAGNOSIS — R079 Chest pain, unspecified: Secondary | ICD-10-CM

## 2010-12-14 LAB — BASIC METABOLIC PANEL
BUN: 9 mg/dL (ref 6–23)
Chloride: 101 mEq/L (ref 96–112)
Creatinine, Ser: 0.58 mg/dL (ref 0.4–1.2)
GFR calc non Af Amer: >60 mL/min (ref >60)
Glucose, Bld: 213 mg/dL — ABNORMAL HIGH (ref 70–99)
Potassium: 4 mEq/L (ref 3.5–5.1)

## 2010-12-14 LAB — CBC
HCT: 43.1 % (ref 36.0–46.0)
MCH: 31.8 pg (ref 26.0–34.0)
MCV: 95.8 fL (ref 78.0–100.0)
RDW: 12.5 % (ref 11.5–15.5)
WBC: 5.5 10*3/uL (ref 4.0–10.5)

## 2010-12-14 LAB — GLUCOSE, CAPILLARY

## 2010-12-14 MED ORDER — TECHNETIUM TC 99M TETROFOSMIN IV KIT
10.0000 | PACK | Freq: Once | INTRAVENOUS | Status: AC | PRN
  Administered 2010-12-14: 10 via INTRAVENOUS

## 2010-12-14 MED ORDER — TECHNETIUM TC 99M TETROFOSMIN IV KIT
30.0000 | PACK | Freq: Once | INTRAVENOUS | Status: AC | PRN
  Administered 2010-12-14: 30 via INTRAVENOUS

## 2010-12-15 ENCOUNTER — Observation Stay (HOSPITAL_COMMUNITY): Payer: Medicare Other

## 2010-12-15 LAB — GLUCOSE, CAPILLARY
Glucose-Capillary: 258 mg/dL — ABNORMAL HIGH (ref 70–99)
Glucose-Capillary: 215 mg/dL — ABNORMAL HIGH (ref 70–99)

## 2010-12-25 ENCOUNTER — Encounter: Payer: Self-pay | Admitting: Nurse Practitioner

## 2010-12-26 NOTE — Consult Note (Signed)
NAME:  Margaret Fischer, Margaret Fischer NO.:  [id number]  MEDICAL RECORD NO.:  [id number]           PATIENT TYPE:  O  LOCATION:  2033                         FACILITY:  [facility]  PHYSICIAN:  Vesta Mixer, M.D. DATE OF BIRTH:  1940-09-30  DATE OF CONSULTATION: DATE OF DISCHARGE:                                CONSULTATION   Margaret Fischer is a 70 year old female with a history of hypertension, hyperlipidemia, type 2 diabetes mellitus, and a CVA.  She has a chronic pain syndrome.  She has a long history of chest pain and is admitted to the hospital at this time with chest pain as well as some bradycardia.  The patient has a long history of chest pains in the past.  She reports having 2 normal heart catheterizations in the past.  One was done at [facility].  She does not remember where the other cardiac catheterization was performed.  There is no record of her getting a heart catheterization here at [facility].  She has also had a normal stress test.  The patient presents with episodes of chest pain and also was found to have some bradycardia at her medical doctor's.  Because of this bradycardia, she was admitted.  The patient has had episodes of pain for several months.  These pains have not worsened acutely.  She seems to have somewhat of an altered mental status.  It was difficult to tell exactly the character of her pain.  Her current medications include; 1. Percocet once a day. 2. Lantus insulin 30 units at bedtime. 3. Gabapentin 300 mg twice a day. 4. Potassium chloride 20 mEq a day. 5. Furosemide 40 mg a day. 6. Vitamin D once a day. 7. Vitamin D3 once a day. 8. Vitamin B12 once a day. 9. Clonazepam 1 tablet three times a day as needed. 10.Aspirin 325 mg a day as needed.  She is allergic to PHENERGAN, MORPHINE, HYDROMORPHONE, EPINEPHRINE, and SUCCINYLCHOLINE.  PAST MEDICAL HISTORY: 1. Diabetes mellitus. 2. Hyperlipidemia. 3. Hypertension. 4.  History of stroke. 5. Chronic pain syndrome.  PAST SURGICAL HISTORY:  Status post left knee replacement.  FAMILY HISTORY:  Noncontributory.  SOCIAL HISTORY:  The patient has a history of smoking in the remote past, but quit smoking about 27 years ago.  PHYSICAL EXAMINATION:  GENERAL:  On exam, she is an elderly female.  Her affect is somewhat flat.  Her speech patterns are slow.  I suspect that she has had some sedatives such as clonidine or perhaps a Percocet medication to explain her slow speech pattern. VITAL SIGNS:  Her heart rate is 47, her blood pressure is 147/62, her O2 sat is 95% on room air. HEENT:  Reveals 2+ carotids.  She has no bruits, no JVD, no thyromegaly. NECK:  Supple. LUNGS:  Clear. HEART:  Regular rate.  S1, S2. ABDOMEN:  Reveals good bowel sounds and is nontender. EXTREMITIES:  She has no clubbing, cyanosis, or edema. NEURO:  Nonfocal.  Her gait was normal.  Her EKG reveals sinus bradycardia.  She has no ST or T-wave changes.  Her CPK and troponin levels  were negative x3.  Her TSH is 2.32.  Her electrolytes and creatinine are normal.  Her glucose levels are moderately elevated.  IMPRESSION AND PLAN:  Chest pain.  This is most likely a noncardiac chest pain.  Her enzymes are normal.  She has had 2 normal heart catheterizations, the last one was 2 years ago.  She does have numerous risk factors for coronary artery disease.  I think would be reasonable to proceed with a Lexiscan Myoview study for further evaluation.  If this is negative, she can go home tomorrow.  I do not think that we will need to see her on a long-term basis since all of her cardiac workup is negative.     Vesta Mixer, M.D.     PJN/MEDQ  D:  12/13/2010  T:  12/14/2010  Job:  [id number]  cc:   Dr. Toni Arthurs  Electronically Signed by Kristeen Miss M.D. on 12/26/2010 06:19:32 PM

## 2010-12-29 ENCOUNTER — Encounter: Payer: Medicare Other | Admitting: Nurse Practitioner

## 2010-12-29 ENCOUNTER — Encounter: Payer: Self-pay | Admitting: *Deleted

## 2011-01-03 NOTE — Discharge Summary (Signed)
NAME:  Fischer, Margaret                 ACCOUNT NO.:  [id number]  MEDICAL RECORD NO.:  [id number]           PATIENT TYPE:  O  LOCATION:  2033                         FACILITY:  [facility]  PHYSICIAN:  Leighton Roach Kemonte Ullman, M.D.DATE OF BIRTH:  [birth year]  DATE OF ADMISSION:  12/12/2010 DATE OF DISCHARGE:  12/15/2010                              DISCHARGE SUMMARY   PRIMARY CARE PROVIDER:  Dr. Toni Arthurs.  DISCHARGE DIAGNOSES: 1. Chest pain, noncardiac etiology. 2. Bradycardia. 3. Dizziness. 4. Hypertension. 5. Type 2 diabetes. 6. Chronic pain/fibromyalgia.  DISCHARGE MEDICATIONS: 1. Gabapentin 300 mg 1 tablet by mouth twice daily as needed for pain     or muscle spasms. 2. Insulin Lantus injection 20 units subcu daily at bedtime. 3. Nitroglycerin 0.4 mg tablets 1 tablet under tongue every 5 minutes     as needed up to 3 doses as needed. 4. Outpatient PT and OT referral with low vision for assistance with     balance and vestibular symptoms. 5. Aspirin 325 mg 1 tablet by mouth daily. 6. Klonopin 2 mg 1 tablet by mouth 3 times a day as needed.  Special     instruction, the patient usually takes at night to help with sleep. 7. Lasix 40 mg 1 tablet by mouth daily as needed. 8. Percocet 5/325 one tablet by mouth every 4 hours as needed. 9. Potassium chloride 20 mEq 1 tablet by mouth daily. 10.Vitamin D. 11.Vitamin D3.  Stop taking the following medications:  Lantus 30 units subcu daily at bedtime.  CONSULTS:  [facility]  PROCEDURES: 1. December 12, 2010, chest x-ray:  No evidence of active cardiopulmonary     disease. 2. December 14, 2010, Lexiscan Myoview study:  No evidence of ischemia,     normal left ventricular systolic function. 3. CT head without contrast:  No significant abnormalities.  PERTINENT LABORATORY DATA AT DISCHARGE:  BMET, sodium 146, potassium 4, chloride 101, CO2 34, BUN 9, creatinine 0.58 glucose 213.  CBC, white count 5.5, hemoglobin 14.3, hematocrit 43.1,  platelets 249.  TSH 2.372. Cardiac enzymes negative x3.  BRIEF HOSPITAL COURSE:  This is a 70 year old female with past medical history significant for hypertension, hyperlipidemia, insulin-dependent type 2 diabetes, and history of 2 CVAs.  She presented with a 2-3-day history of retrosternal chest pain and was found to have a heart rate in the 40s. 1. Chest pain.  The patient had multiple risk factors for acute     coronary syndrome.  There was no ischemia noted on EKG and her     cardiac enzymes were negative x3.  A 2-D echo was performed which     showed an estimated ejection fraction of 60%.  Lexiscan Myoview     results are as above.  Cardiology was consulted to help manage the     patient's bradycardia.  The patient was placed on telemetry     throughout the hospital course and her rate remained in the 40s to     50s.  Cardiology think that the chest pain is noncardiac and signed     off.  The chest pain was reproducible  on palpation I believe this     is secondary to her chronic pain/fibromyalgia diagnosis.  She was     tender on palpation of her chest and her shoulders and her abdomen.     We continued to the patient's home medications of gabapentin and     Percocet.  Her chest pain waxed and waned throughout the hospital     course. 2. Bradycardia.  The patient's heart rate was in the 40s to 50s     throughout hospital course.  She was not on any rate limiting     drugs.  Cardiology revealed recommendations regarding the     bradycardia.  The patient is to follow up with her PCP if her     symptoms of dizziness become any worse and the patient may need to     have an outpatient Cardiology referral. 3. Dizziness.  This is likely secondary to her diagnosis of vertigo.     PT/OT were consulted and they recommend outpatient PT and OT with     low vision for balance and vestibular symptoms.  Do not start the     patient on any medication in particular at this time, she was very      particular about taking too many medications.  She is to follow up     with her PCP for further management of vertigo and she is to follow     up with the outpatient PT and OT. 4. Hypertension.  The patient is noncompliant with her     antihypertensive medications.  She says that all antihypertensives     make to her be feel drowsy.  We did try to start her lisinopril for     her diabetes and her hypertension, but she refused to take this. 5. Diabetes mellitus type 2.  The patient's home dose is Lantus 30     units subcu daily, however, in hospital she only required 20 units     subcu daily.  We will send the patient out on the Lantus 20, but if     she follows up with her PCP and she may need to start taking 30     again pending her CBGs and A1c. 6. Peripheral neuropathy.  The patient was continued on her home     medications of gabapentin. 7. Fibromyalgia and chronic pain.  The patient was continued on her     home medication of gabapentin and Percocet.  Both medications made     the patient feel very drowsy, which she will call "drunkenness."     We try to avoid giving any gabapentin for over 24 hours this made     patient very upset and she was still dizzy in regardless of holding     her gabapentin, therefore, we send the patient home on her home     dose of gabapentin and Percocet, and we think that the dizziness     and drowsiness is likely secondary to her vertigo.  DISCHARGE INSTRUCTIONS:  ACTIVITY:  Per outpatient physical therapy and occupational therapy.  DIET:  Low-sodium heart-healthy and carb-modified.  WOUND CARE:  Not applicable.  FOLLOWUP APPOINTMENTS:  Dr. Toni Arthurs, the patient to schedule hospital follow up in 1-2 weeks.  HOME HEALTH:  The patient has a prescription for outpatient OT and PT.  SPECIAL INSTRUCTIONS: 1. Avoid any straining. 2. Stop any activity that causes chest pain, shortness of breath,     dizziness, sweating or excessive  weakness.  DISCHARGE CONDITION:  The patient was discharged home in stable medical condition.    ______________________________ Barnabas Lister, MD   ______________________________ Leighton Roach Armanie Martine, M.D.    ID/MEDQ  D:  12/20/2010  T:  12/21/2010  Job:  [id number]  Electronically Signed by Barnabas Lister MD on 12/24/2010 07:25:21 PM Electronically Signed by Acquanetta Belling M.D. on 01/03/2011 05:32:32 PM

## 2011-01-04 NOTE — H&P (Signed)
NAME:  Fischer, Margaret                 ACCOUNT NO.:  [id number]  MEDICAL RECORD NO.:  [id number]           PATIENT TYPE:  O  LOCATION:  2033                         FACILITY:  [facility]  PHYSICIAN:  Margaret Fischer. Margaret Fischer, M.D.DATE OF BIRTH:  [birth year]  DATE OF ADMISSION:  12/12/2010 DATE OF DISCHARGE:                             HISTORY & PHYSICAL   PRIMARY CARE PHYSICIAN:  Unassigned  CHIEF COMPLAINT:  Chest pain, bradycardia.  HISTORY OF PRESENT ILLNESS:  Margaret Fischer is a 70-year female with past medical history significant for hypertension, hyperlipidemia, insulin- dependent type 2 diabetes, 2 CVAs in the late 1990s, and also had a history of 2 cardiac catheterizations, who presents today with a 2-3-day history of retrosternal chest pain or pressure.  The patient states this pain started several days ago "out of blue."  Describes concomitant shortness of breath related to chest pain.  Of note, the patient also states she has had increasing fatigue for the past week or so, but this is much worse in the past 2-3 days which is roughly the same time period she has been having the pain.  Has history of palpitations and these have been also present for the past several days as well.  Denies any diaphoresis, nausea, vomiting, cough, or lower extremity edema.  Went to her PCP today, she was evaluated for pain, sent to emergency department for further workup.  In the emergency department, she was having bradycardia in the 40s.  Family Practice Teaching Service Service was called to admit to rule out ACS as well as to further evaluate her bradycardia.  Of note, the patient does admit she is not taking prescribed medications except for her Lantus, gabapentin, Percocet, and aspirin.  She is specifically on daily Plavix as well as other hypertensive and antidepressive medications.  Furthermore regarding her chest pain, she describes the pain as 8/10 currently, 10/10 when it is worse, radiates to her  right arm, lasts for about 30 minutes to an hour before resolving on its own. The patient does have nitroglycerin at home but she has not taken any. In the emergency department, pain was relieved by nitroglycerin.  PAST MEDICAL HISTORY: 1. Hypertension. 2. Hyperlipidemia. 3. History of recent fall and left lower rib cage pain, likely     secondary to rib contusion. 4. 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 5. Insulin requiring type 2 diabetes mellitus. 6. 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 7. Fibromyalgia, chronic pain syndrome.  PAST SURGICAL HISTORY:  Left knee replacement.  HOME MEDICATIONS:  The only medication she takes are listed above: 1. Lantus 15 units subcu at bedtime. 2. Gabapentin 600 mg p.o. t.i.d. 3. Percocet 5/325 mg p.o. q.4 hours p.r.n. pain. 4. Aspirin 81 mg p.o. daily.  As noted above, the patient is specifically on several other medications, but denies taking these for the past several months.  FAMILY HISTORY:  Noncontributory.  Both parents are deceased.  Mother had lymphoma and father died of plane crash.  SOCIAL HISTORY:  The patient lives by herself usually, but she has been living with her sister due  to her sister's recent concussion.  She has a 30-pack-year history of smoking and quit 20 years ago.  She denies any ethanol or illicit drug use.  REVIEW OF SYSTEMS:  Endorses temperature intolerance, fatigue, and cold. Otherwise, 12-point review of systems is completely negative except for HPI.  PHYSICAL EXAMINATION:  VITAL SIGNS:  Temperature 98, pulse 45, respiratory rate 18, blood pressure 176/65, pulse ox 100% on room air. GENERAL:  This patient is sitting up on the hospital bed.  Awake, conversant, alert, and in no apparent distress. HEENT:  Normocephalic, atraumatic.  Pupils equal, round, and reactive to light.  Extraocular movements intact.  Tympanic membranes clear laterally. NECK:  Supple without lymphadenopathy.   No JVD. HEART:  Bradycardic and irregular rhythm.  Grade 1/6 systolic ejection murmur heard in bilateral upper sternal borders.  Distal pulses palpable bilaterally. RESPIRATORY:  Clear to auscultation bilaterally.  Normal work of breathing.  No crackles or wheezing. ABDOMEN:  Obese, soft, nondistended, and nontender with positive bowel sounds noted throughout. Skin:  No rash or lesions noted. MUSCULOSKELETAL:  Pain due to palpation in bilateral paraspinous muscles, mostly in lumbar region. EXTREMITIES:  No lower extremity edema.  No redness, pain, or swelling. NEUROLOGIC:  Alert and oriented x3.  Cranial nerves II-XII intact.  No focal deficits in upper and lower bilateral extremities.  Deferred gait exam.  LABS:  CMET showed sodium 143, potassium 4.3, chloride 104, bicarb 32, BUN 9, creatinine 2.53, glucose 130.  Total bili 4, alk phos 99, AST 22, ALT 12, total protein 5.7, albumin 2.4, calcium 8.6.  CBC showed white blood cells of 6.6, hemoglobin 14.1, hematocrit 41.8, platelets 240. Cardiac enzymes showed troponin 0.01, CK-MB less than 1, myoglobin of 69.  IMAGING:  Chest x-ray showed no acute cardiopulmonary disease.  ASSESSMENT AND PLAN:  A 70 year old female with multiple risk factors who presents today with chest pain and bradycardia.  1. Chest pain.  Multiple risk factors for acute coronary syndrome     although pain has been present for several days.  She has no     cardiac markers or enzymes.  No ischemia noted on EKG.  Plan to     admit the patient to telemetry bed.  Plan to cycle cardiac enzymes,     recheck EKG in the morning.  We will obtain 2D echo to evaluate if     there are any further structural coronary changes.  Last echo was     in February 2012, with a normal EF.  Unlikely for any changes to     occur in the past 3 months, but plan to evaluate for hypokinesis.     The patient does have known heart murmur and wide pulse pressure on     exam and by cardiac  monitor.  Concern will be for aortic     regurgitation.  Again, check 2D echo, check TSH. 2. Bradycardia.  The patient with the heart rate in the 50s-     70s on last admission.  Not on any rate limiting drugs.  Again,     hypokinesis previous/new-onset myocardial infarction could     contribute to bradycardia.  Plan to consult Cardiology for further     recommendations in the morning.  A 2D echo in the morning.  Admit     to telemetry as above. 3. Hypertension.  The patient is noncompliant with medications.  Plan     to start the patient on ACE inhibitor as this patient is a  long-     time diabetic and also has a history of stroke. 4. Diabetes mellitus type 2.  Question the patient's compliance with     her home Lantus.  She states she takes 30 units daily.  We will     halve this dose.  She was discharged on just 14 units on her last     admission. 5. Peripheral neuropathy.  The patient was continued on her home dose     of gabapentin. 6. Fibromyalgia and chronic pain.  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  Limit     narcotics, did this while in-house.  The patient is on long-term     narcotics provided by pain clinic.  We will continue low-dose     benzodiazepines to prevent withdrawal. 7. Fluids, electrolytes, and nutrition/gastrointestinal.  Heart-     healthy diet. 8. Prophylaxis.  Heparin. 9. Disposition.  Pending further workup.     Margaret Don, MD   ______________________________ Margaret Fischer Margaret Fischer, M.D.    JW/MEDQ  D:  12/12/2010  T:  12/13/2010  Job:  [id number]  Electronically Signed by Margaret Fischer  on 12/27/2010 08:04:05 PM Electronically Signed by Doralee Albino M.D. on 01/04/2011 11:00:57 AM

## 2011-01-08 ENCOUNTER — Ambulatory Visit (INDEPENDENT_AMBULATORY_CARE_PROVIDER_SITE_OTHER): Payer: Medicare Other | Admitting: Nurse Practitioner

## 2011-01-08 ENCOUNTER — Encounter: Payer: Self-pay | Admitting: Nurse Practitioner

## 2011-01-08 VITALS — BP 192/88 | HR 56 | Ht 67.0 in | Wt 188.2 lb

## 2011-01-08 DIAGNOSIS — F32A Depression, unspecified: Secondary | ICD-10-CM

## 2011-01-08 DIAGNOSIS — F329 Major depressive disorder, single episode, unspecified: Secondary | ICD-10-CM | POA: Insufficient documentation

## 2011-01-08 DIAGNOSIS — I10 Essential (primary) hypertension: Secondary | ICD-10-CM

## 2011-01-08 DIAGNOSIS — R079 Chest pain, unspecified: Secondary | ICD-10-CM | POA: Insufficient documentation

## 2011-01-08 LAB — BASIC METABOLIC PANEL
BUN: 13 mg/dL (ref 6–23)
CO2: 29 mEq/L (ref 19–32)
Calcium: 8.7 mg/dL (ref 8.4–10.5)
Chloride: 100 mEq/L (ref 96–112)
Creatinine, Ser: 0.4 mg/dL (ref 0.4–1.2)
GFR: 158.94 mL/min (ref >60.00)
Glucose, Bld: 131 mg/dL — ABNORMAL HIGH (ref 70–99)
Potassium: 4.3 mEq/L (ref 3.5–5.1)
Sodium: 140 mEq/L (ref 135–145)

## 2011-01-08 MED ORDER — LISINOPRIL 10 MG PO TABS: 10.0000 mg | ORAL_TABLET | Freq: Every day | ORAL | Status: DC

## 2011-01-08 NOTE — Assessment & Plan Note (Signed)
I think we need to address her blood pressure. Her Myoview was ok. She does not have known CAD but has multiple risk factors. She does not seem interested in taking care of herself at this time.

## 2011-01-08 NOTE — Progress Notes (Signed)
Margaret Fischer Date of Birth: 04-Jun-1941   History of Present Illness: Margaret Fischer is seen back today for a post hospital visit. She is seen for Dr. Elease Hashimoto. She is a 70 year old female with multiple issues and chronic chest pain. She has recently been hospitalized with chest pain. Her enzymes were negative. She had a negative Myoview and echo. She is noncompliant with her medicines. Her blood pressure is uncontrolled. She refused Lisinopril during her hospitalization. She has already had prior stroke. She notes that she always feels bad. She is very tearful and seems very depressed. She thinks she may have Lymes disease. She has had no known tick bite but is concerned about a knot on her labia.   Current Outpatient Prescriptions on File Prior to Visit  Medication Sig Dispense Refill  . aspirin 325 MG tablet Take 325 mg by mouth 2 (two) times daily.       . Cholecalciferol (VITAMIN D-3 PO) Take by mouth daily.        . clonazePAM (KLONOPIN) 2 MG tablet Take 2 mg by mouth 3 (three) times daily as needed.        . furosemide (LASIX) 40 MG tablet Take 40 mg by mouth every other day.       . gabapentin (NEURONTIN) 300 MG capsule Take 300 mg by mouth 2 (two) times daily.       Marland Kitchen glucosamine-chondroitin 500-400 MG tablet Take 1 tablet by mouth 2 (two) times daily.        . insulin glargine (LANTUS) 100 UNIT/ML injection Inject 30 Units into the skin at bedtime.       Marland Kitchen l-methylfolate-b2-b6-b12 (CEREFOLIN) 01-18-49-5 MG TABS Take 1 tablet by mouth 2 (two) times daily.        . nitroGLYCERIN (NITROSTAT) 0.4 MG SL tablet Place 0.4 mg under the tongue every 5 (five) minutes as needed.        Marland Kitchen oxyCODONE-acetaminophen (PERCOCET) 5-325 MG per tablet Take 1 tablet by mouth every 4 (four) hours as needed.        . potassium chloride SA (K-DUR,KLOR-CON) 20 MEQ tablet Take 20 mEq by mouth daily.        . pravastatin (PRAVACHOL) 40 MG tablet Take 40 mg by mouth daily.        Marland Kitchen lisinopril (PRINIVIL,ZESTRIL) 10 MG  tablet Take 1 tablet (10 mg total) by mouth daily.  30 tablet  11  . DISCONTD: Cholecalciferol (VITAMIN D PO) Take by mouth daily.          Allergies  Allergen Reactions  . Epinephrine   . Hydromorphone   . Morphine And Related   . Phenergan (Promethazine Hcl)   . Succinylcholine     Past Medical History  Diagnosis Date  . Hypertension   . Diabetes mellitus     Insulin dependent  . Chest pain, non-cardiac     History of 2 normal cardiac catheterizations  . Bradycardia   . Dizziness     Chronic  . Fibromyalgia   . Neuropathy   . Stroke   . Chronic chest pain     Past Surgical History  Procedure Date  . Total knee arthroplasty   . Cardiac catheterization     History of 2 caths, reportedly normal    History  Smoking status  . Former Smoker  . Quit date: 12/25/1983  Smokeless tobacco  . Never Used    History  Alcohol Use No    Family History  Problem Relation Age of Onset  . Lymphoma Mother     Review of Systems: The review of systems is positive for chronic chest pain and fatigue. Blood pressure is high at home. She has been noncompliant due to cost issues.  All other systems were reviewed and are negative.  Physical Exam: BP 192/88  Pulse 56  Ht 5\' 7"  (1.702 m)  Wt 188 lb 3.2 oz (85.367 kg)  BMI 29.48 kg/m2 Patient is pleasant and in no acute distress. She is tearful and depressed. Skin is warm and dry. Color is normal.  HEENT is unremarkable. Normocephalic/atraumatic. PERRL. Sclera are nonicteric. Neck is supple. No masses. No JVD. Lungs are clear. Cardiac exam shows a regular rate and rhythm. Abdomen is soft. Extremities are without edema. Gait and ROM are intact. No gross neurologic deficits noted.  LABORATORY DATA:  BMET is pending   Assessment / Plan:

## 2011-01-08 NOTE — Assessment & Plan Note (Signed)
I have suggested counseling. She does not seem interested at this time.

## 2011-01-08 NOTE — Patient Instructions (Signed)
I have started you on some blood pressure medicine. It is Lisinopril 10 mg and you take it once a day. We will see you back in about 2 weeks. Watch your salt intake.

## 2011-01-08 NOTE — Assessment & Plan Note (Signed)
Blood pressure is grossly elevated. She says she will take the Lisinopril. I will check a BMET today and start her on 10 mg daily. Prescription is sent to the [facility]. I will see her back in 2 weeks.

## 2011-01-09 ENCOUNTER — Telehealth: Payer: Self-pay | Admitting: *Deleted

## 2011-01-09 NOTE — Telephone Encounter (Signed)
Patient called with lab results, msg left. Alfonso Ramus RN

## 2011-01-09 NOTE — Telephone Encounter (Signed)
Message copied by Mahalia Longest on Tue Jan 09, 2011  1:46 PM ------      Message from: Norma Fredrickson      Created: Mon Jan 08, 2011  3:53 PM       Ok to report. Labs are satisfactory.

## 2011-01-19 ENCOUNTER — Ambulatory Visit (INDEPENDENT_AMBULATORY_CARE_PROVIDER_SITE_OTHER): Payer: Medicare Other | Admitting: Nurse Practitioner

## 2011-01-19 ENCOUNTER — Encounter: Payer: Self-pay | Admitting: Nurse Practitioner

## 2011-01-19 VITALS — BP 190/80 | HR 52 | Ht 66.0 in | Wt 192.0 lb

## 2011-01-19 DIAGNOSIS — R079 Chest pain, unspecified: Secondary | ICD-10-CM

## 2011-01-19 DIAGNOSIS — I10 Essential (primary) hypertension: Secondary | ICD-10-CM

## 2011-01-19 LAB — BASIC METABOLIC PANEL
BUN: 14 mg/dL (ref 6–23)
CO2: 32 mEq/L (ref 19–32)
Calcium: 8.7 mg/dL (ref 8.4–10.5)
Chloride: 104 mEq/L (ref 96–112)
Creatinine, Ser: 0.5 mg/dL (ref 0.4–1.2)
GFR: 136.23 mL/min (ref >60.00)
Glucose, Bld: 110 mg/dL — ABNORMAL HIGH (ref 70–99)
Potassium: 4 mEq/L (ref 3.5–5.1)
Sodium: 142 mEq/L (ref 135–145)

## 2011-01-19 MED ORDER — LISINOPRIL 10 MG PO TABS: 20.0000 mg | ORAL_TABLET | Freq: Every day | ORAL | Status: DC

## 2011-01-19 MED ORDER — AMLODIPINE BESYLATE 5 MG PO TABS: 5.0000 mg | ORAL_TABLET | Freq: Every day | ORAL | Status: DC

## 2011-01-19 NOTE — Progress Notes (Signed)
Andy Gauss Pask Date of Birth: Jul 15, 1941   History of Present Illness: Ms. Distler is seen back today for a 2 week visit. She is seen for Dr. Elease Hashimoto. She is back on some Lisinopril. Her blood pressure remains very high. She remains depressed. She remains dizzy. She has not been back to her PCP yet. She was recently hospitalized with chest pain and had a negative myoview.   Current Outpatient Prescriptions on File Prior to Visit  Medication Sig Dispense Refill  . aspirin 325 MG tablet Take 325 mg by mouth 2 (two) times daily.       Marland Kitchen b complex vitamins tablet Take 1 tablet by mouth daily.        . Cholecalciferol (VITAMIN D-3 PO) Take by mouth daily.        . clonazePAM (KLONOPIN) 2 MG tablet Take 2 mg by mouth 3 (three) times daily as needed.        . furosemide (LASIX) 40 MG tablet Take 40 mg by mouth every other day.       . gabapentin (NEURONTIN) 300 MG capsule Take 300 mg by mouth 2 (two) times daily.       Marland Kitchen glucosamine-chondroitin 500-400 MG tablet Take 1 tablet by mouth 2 (two) times daily.        . insulin glargine (LANTUS) 100 UNIT/ML injection Inject 30 Units into the skin at bedtime.       Marland Kitchen l-methylfolate-b2-b6-b12 (CEREFOLIN) 01-18-49-5 MG TABS Take 1 tablet by mouth 2 (two) times daily.        . nitroGLYCERIN (NITROSTAT) 0.4 MG SL tablet Place 0.4 mg under the tongue every 5 (five) minutes as needed.        Marland Kitchen oxyCODONE-acetaminophen (PERCOCET) 5-325 MG per tablet Take 1 tablet by mouth every 4 (four) hours as needed.        . potassium chloride SA (K-DUR,KLOR-CON) 20 MEQ tablet Take 20 mEq by mouth daily.        . pravastatin (PRAVACHOL) 40 MG tablet Take 40 mg by mouth daily.        Marland Kitchen DISCONTD: lisinopril (PRINIVIL,ZESTRIL) 10 MG tablet Take 1 tablet (10 mg total) by mouth daily.  30 tablet  11    Allergies  Allergen Reactions  . Epinephrine   . Hydromorphone   . Morphine And Related   . Phenergan (Promethazine Hcl)   . Succinylcholine     Past Medical History    Diagnosis Date  . Hypertension   . Diabetes mellitus     Insulin dependent  . Chest pain, non-cardiac     History of 2 normal cardiac catheterizations  . Bradycardia   . Dizziness     Chronic  . Fibromyalgia   . Neuropathy   . Stroke   . Chronic chest pain     Past Surgical History  Procedure Date  . Total knee arthroplasty   . Cardiac catheterization     History of 2 caths, reportedly normal    History  Smoking status  . Former Smoker  . Quit date: 12/25/1983  Smokeless tobacco  . Never Used    History  Alcohol Use No    Family History  Problem Relation Age of Onset  . Lymphoma Mother     Review of Systems: The review of systems is positive for depression, crying and chronic dizziness.  All other systems were reviewed and are negative.  Physical Exam: BP 190/80  Pulse 52  Ht 5\' 6"  (1.676 m)  Wt 192 lb (87.091 kg)  BMI 30.99 kg/m2 Patient is in no acute distress. She is depressed and tearful again today. Skin is warm and dry. Color is normal.  HEENT is unremarkable. Normocephalic/atraumatic. PERRL. Sclera are nonicteric. Neck is supple. No masses. No JVD. Lungs are clear. Cardiac exam shows a regular rate and rhythm. Abdomen is soft. Extremities are without edema. Gait and ROM are intact. No gross neurologic deficits noted.  LABORATORY DATA: PENDING   Assessment / Plan:

## 2011-01-19 NOTE — Patient Instructions (Signed)
I want you to increase your Lisinopril to 2 tablets (20mg ) every day Get back on the Norvasc 5 mg daily We will check your lab today to look at your potassium level. We may be able to stop your potassium pill. The nurse will call you with the results probably on Monday.  I will have you see Dr. Elease Hashimoto in 1 week

## 2011-01-19 NOTE — Assessment & Plan Note (Signed)
Blood pressure needs attention. She does not have known CAD but has multiple risk factors. As stated previously, she does not seem interested in taking care of herself at this time.

## 2011-01-19 NOTE — Assessment & Plan Note (Signed)
Blood pressure remains very high. I have restarted her Norvasc at 5 mg and increased the Lisinopril to two tablets a day for a total of 20 mg. BMET is checked today. I will have her see Dr. Elease Hashimoto for follow up in 1 week.

## 2011-01-22 ENCOUNTER — Other Ambulatory Visit: Payer: Self-pay | Admitting: *Deleted

## 2011-01-22 MED ORDER — LISINOPRIL 10 MG PO TABS: 20.0000 mg | ORAL_TABLET | Freq: Every day | ORAL | Status: DC

## 2011-01-22 NOTE — Telephone Encounter (Signed)
escribe medication per fax request  

## 2011-01-23 NOTE — Progress Notes (Signed)
msg left, normal labs.Alfonso Ramus RN

## 2011-01-25 ENCOUNTER — Encounter: Payer: Self-pay | Admitting: Cardiovascular Disease

## 2011-01-25 ENCOUNTER — Ambulatory Visit (INDEPENDENT_AMBULATORY_CARE_PROVIDER_SITE_OTHER): Payer: Medicare Other | Admitting: Cardiovascular Disease

## 2011-01-25 DIAGNOSIS — I10 Essential (primary) hypertension: Secondary | ICD-10-CM

## 2011-01-25 DIAGNOSIS — R079 Chest pain, unspecified: Secondary | ICD-10-CM

## 2011-01-25 NOTE — Assessment & Plan Note (Signed)
Her blood pressure remains mildly elevated. She does not get any exercise. I've encouraged her to get regular exercise and to watch her salt intake. I'll turn her back over to her medical doctor For further management of her high blood pressure.  I will see her on an as needed basis.

## 2011-01-25 NOTE — Progress Notes (Signed)
Margaret Fischer Date of Birth  1940-09-21 [facility] [address]     [address] [place], [place]  [address] [phone]  Fax  [phone]  History of Present Illness:  Margaret Fischer is a middle-age female who is seen in the hospital a month or so ago. She has a long history of chest pains. She's had 2 heart catheterizations which were reportedly negative. Neither heart catheterization was performed at [facility].  She was admitted to [facility] for episodes of chest pain. She ruled out for myocardial infarction. She had a stress Myoview study which was normal. She had no evidence of ischemia and her left ventricular systolic function was normal.  She has been followed by her general medical doctor for hypertension.  She remains very depressed. It was very difficult to get a history out of her.  Current Outpatient Prescriptions on File Prior to Visit  Medication Sig Dispense Refill  . amLODipine (NORVASC) 5 MG tablet Take 1 tablet (5 mg total) by mouth daily.  30 tablet  11  . aspirin 325 MG tablet Take 325 mg by mouth 2 (two) times daily.       Marland Kitchen b complex vitamins tablet Take 1 tablet by mouth daily.        . Cholecalciferol (VITAMIN D-3 PO) Take by mouth daily.        . clonazePAM (KLONOPIN) 2 MG tablet Take 2 mg by mouth 3 (three) times daily as needed.        . furosemide (LASIX) 40 MG tablet Take 40 mg by mouth every other day.       . gabapentin (NEURONTIN) 300 MG capsule Take 300 mg by mouth 2 (two) times daily.       Marland Kitchen glucosamine-chondroitin 500-400 MG tablet Take 1 tablet by mouth 2 (two) times daily.        . insulin glargine (LANTUS) 100 UNIT/ML injection Inject 30 Units into the skin at bedtime.       Marland Kitchen l-methylfolate-b2-b6-b12 (CEREFOLIN) 01-18-49-5 MG TABS Take 1 tablet by mouth 2 (two) times daily.        Marland Kitchen lisinopril (PRINIVIL,ZESTRIL) 10 MG tablet Take 2 tablets (20 mg total) by mouth daily.  60 tablet  11  . metFORMIN (GLUCOPHAGE)  500 MG tablet Take 500 mg by mouth 2 (two) times daily with a meal.        . nitroGLYCERIN (NITROSTAT) 0.4 MG SL tablet Place 0.4 mg under the tongue every 5 (five) minutes as needed.        Marland Kitchen oxyCODONE-acetaminophen (PERCOCET) 5-325 MG per tablet Take 1 tablet by mouth every 4 (four) hours as needed.        . potassium chloride SA (K-DUR,KLOR-CON) 20 MEQ tablet Take 20 mEq by mouth daily.        . pravastatin (PRAVACHOL) 40 MG tablet Take 40 mg by mouth daily.          Allergies  Allergen Reactions  . Epinephrine   . Hydromorphone   . Morphine And Related   . Phenergan (Promethazine Hcl)   . Succinylcholine     Past Medical History  Diagnosis Date  . Hypertension   . Diabetes mellitus     Insulin dependent  . Chest pain, non-cardiac     History of 2 normal cardiac catheterizations  . Bradycardia   . Dizziness     Chronic  . Fibromyalgia   . Neuropathy   . Stroke   .  Chronic chest pain     Past Surgical History  Procedure Date  . Total knee arthroplasty   . Cardiac catheterization     History of 2 caths, reportedly normal    History  Smoking status  . Former Smoker  . Quit date: 12/25/1983  Smokeless tobacco  . Never Used    History  Alcohol Use No    Family History  Problem Relation Age of Onset  . Lymphoma Mother     Reviw of Systems:  Reviewed in the HPI.  All other systems are negative.  Physical Exam: BP 162/72  Pulse 50  Ht 6' 0.5" (1.842 m)  Wt 189 lb (85.73 kg)  BMI 25.28 kg/m2 The patient is alert and oriented x 3.  The mood and affect are normal.  The skin is warm and dry.  Color is normal.  The HEENT exam reveals that the sclera are nonicteric.  The mucous membranes are moist.  The carotids are 2+ without bruits.  There is no thyromegaly.  There is no JVD.  The lungs are clear.  The chest wall is non tender.  The heart exam reveals a regular rate with a normal S1 and S2.  There are no murmurs, gallops, or rubs.  The PMI is not displaced.    Abdominal exam reveals good bowel sounds.  There is no guarding or rebound.  There is no hepatosplenomegaly or tenderness.  There are no masses.  Exam of the legs reveal no clubbing, cyanosis, or edema.  The legs are without rashes.  The distal pulses are intact.  Cranial nerves II - XII are intact.  Motor and sensory functions are intact.  The gait is normal.  ECG: Sinus bradycardia. She has no ST or T wave changes. Assessment / Plan:

## 2011-01-25 NOTE — Assessment & Plan Note (Signed)
Margaret Fischer initially had a cardiology consultation for chest pain. I do not suspect that her chest pains are due to a cardiac etiology. She has had to work normal heart catheterizations by her account. She had her recent stress Myoview study that was normal. We'll turn her back over to her general medical provider for further evaluation of this noncardiac chest pain.

## 2011-09-04 ENCOUNTER — Encounter (HOSPITAL_COMMUNITY): Payer: Self-pay | Admitting: Emergency Medicine

## 2011-09-04 ENCOUNTER — Emergency Department (HOSPITAL_COMMUNITY): Payer: Medicare Other

## 2011-09-04 ENCOUNTER — Emergency Department (HOSPITAL_COMMUNITY)
Admission: EM | Admit: 2011-09-04 | Discharge: 2011-09-04 | Disposition: A | Discharge status: Home / Self Care | Payer: Medicare Other | Attending: Emergency Medicine | Admitting: Emergency Medicine

## 2011-09-04 DIAGNOSIS — F411 Generalized anxiety disorder: Secondary | ICD-10-CM | POA: Insufficient documentation

## 2011-09-04 DIAGNOSIS — IMO0001 Reserved for inherently not codable concepts without codable children: Secondary | ICD-10-CM | POA: Insufficient documentation

## 2011-09-04 DIAGNOSIS — Z794 Long term (current) use of insulin: Secondary | ICD-10-CM | POA: Insufficient documentation

## 2011-09-04 DIAGNOSIS — R42 Dizziness and giddiness: Secondary | ICD-10-CM | POA: Insufficient documentation

## 2011-09-04 DIAGNOSIS — R209 Unspecified disturbances of skin sensation: Secondary | ICD-10-CM | POA: Insufficient documentation

## 2011-09-04 DIAGNOSIS — Z7982 Long term (current) use of aspirin: Secondary | ICD-10-CM | POA: Insufficient documentation

## 2011-09-04 DIAGNOSIS — R51 Headache: Secondary | ICD-10-CM | POA: Insufficient documentation

## 2011-09-04 DIAGNOSIS — R200 Anesthesia of skin: Secondary | ICD-10-CM

## 2011-09-04 DIAGNOSIS — Z96659 Presence of unspecified artificial knee joint: Secondary | ICD-10-CM | POA: Insufficient documentation

## 2011-09-04 DIAGNOSIS — I10 Essential (primary) hypertension: Secondary | ICD-10-CM | POA: Insufficient documentation

## 2011-09-04 DIAGNOSIS — F419 Anxiety disorder, unspecified: Secondary | ICD-10-CM

## 2011-09-04 DIAGNOSIS — Z8673 Personal history of transient ischemic attack (TIA), and cerebral infarction without residual deficits: Secondary | ICD-10-CM | POA: Insufficient documentation

## 2011-09-04 DIAGNOSIS — Z79899 Other long term (current) drug therapy: Secondary | ICD-10-CM | POA: Insufficient documentation

## 2011-09-04 DIAGNOSIS — E119 Type 2 diabetes mellitus without complications: Secondary | ICD-10-CM | POA: Insufficient documentation

## 2011-09-04 LAB — PROTIME-INR: Prothrombin Time: 13.2 seconds (ref 11.6–15.2)

## 2011-09-04 LAB — DIFFERENTIAL
Basophils Absolute: 0 10*3/uL (ref 0.0–0.1)
Basophils Relative: 0 % (ref 0–1)
Eosinophils Relative: 2 % (ref 0–5)
Monocytes Absolute: 0.6 10*3/uL (ref 0.1–1.0)
Neutro Abs: 7 10*3/uL (ref 1.7–7.7)

## 2011-09-04 LAB — CK TOTAL AND CKMB (NOT AT ARMC)
CK, MB: 2.2 ng/mL (ref 0.3–4.0)
Relative Index: INVALID (ref 0.0–2.5)

## 2011-09-04 LAB — APTT: aPTT: 30 seconds (ref 24–37)

## 2011-09-04 LAB — CBC
HCT: 47.7 % — ABNORMAL HIGH (ref 36.0–46.0)
MCHC: 34 g/dL (ref 30.0–36.0)
MCV: 93.5 fL (ref 78.0–100.0)
Platelets: 265 10*3/uL (ref 150–400)
RDW: 12 % (ref 11.5–15.5)

## 2011-09-04 LAB — URINALYSIS, ROUTINE W REFLEX MICROSCOPIC
Bilirubin Urine: NEGATIVE
Leukocytes, UA: NEGATIVE
Nitrite: NEGATIVE
Specific Gravity, Urine: 1.009 (ref 1.005–1.030)
Urobilinogen, UA: 0.2 mg/dL (ref 0.0–1.0)

## 2011-09-04 LAB — GLUCOSE, CAPILLARY: Glucose-Capillary: 139 mg/dL — ABNORMAL HIGH (ref 70–99)

## 2011-09-04 LAB — COMPREHENSIVE METABOLIC PANEL
AST: 15 U/L (ref 0–37)
Albumin: 3.7 g/dL (ref 3.5–5.2)
Calcium: 9.2 mg/dL (ref 8.4–10.5)
Creatinine, Ser: 0.39 mg/dL — ABNORMAL LOW (ref 0.50–1.10)
Sodium: 139 mEq/L (ref 135–145)

## 2011-09-04 MED ORDER — OXYCODONE-ACETAMINOPHEN 5-325 MG PO TABS
1.0000 | ORAL_TABLET | Freq: Once | ORAL | Status: AC
  Administered 2011-09-04: 1 via ORAL
  Filled 2011-09-04: qty 1

## 2011-09-04 NOTE — ED Notes (Signed)
[id number]<AV> Expected date:09/04/11<BR> Expected time: 5:38 PM<BR> Means of arrival:Ambulance<BR> Comments:<BR> EMS 10 GC, 70 yof anxiety

## 2011-09-04 NOTE — ED Notes (Signed)
Hx CVA in 1999-- slight residual weakness in right grip, to ED via [facility]- c/o anxiety/numbness on right side that had resolved by time EMS got to her house.

## 2011-09-04 NOTE — ED Notes (Signed)
Patient sts that she take percocet 5 /325 TID and it is due for 1900 for her head ache.  Dr Alto Denver notified

## 2011-09-04 NOTE — ED Notes (Signed)
H/A improving but not resolved Dr Alto Denver in to see will give additional percocet and D/c.  BP improving 178/70

## 2011-09-05 NOTE — ED Provider Notes (Signed)
History     CSN: [id number]  Arrival date & time 09/04/11  7829   First MD Initiated Contact with Patient 09/04/11 1828      Chief Complaint  Patient presents with  . Anxiety  . Headache  . right sided numbness     (Consider location/radiation/quality/duration/timing/severity/associated sxs/prior treatment) HPI Patient is a 71 yo F who presents today complaining of one of her "spells".  Patient has history of 2 prior CVAs and states symptoms were different from this.  Patient describes RUE decrease in sensation that began in her fingertips and extended proximally until it reached her body and crossed the midline.  She was working on finances at the computer when this occurred.  Patient had no extremity weakness.  She stated her tongue felt hick but family denies any dysarthria when she called them to report her symptoms.  Patient reports symptoms began at 1630.  She reports that they have all resolved now except for her memory of events.  However, when I review her memory of events the patient remembers everything.  She complains of a headache now but reports that she frequently gets these and takes percocet for these at home.  Headache is 5/10.  Blood pressure is very elevated on presentation.  She has no limb drift or extremity weakness whatsoever.  There are no other associated or modifying factors. Past Medical History  Diagnosis Date  . Hypertension   . Diabetes mellitus     Insulin dependent  . Chest pain, non-cardiac     History of 2 normal cardiac catheterizations  . Bradycardia   . Dizziness     Chronic  . Fibromyalgia   . Neuropathy   . Stroke   . Chronic chest pain     Past Surgical History  Procedure Date  . Total knee arthroplasty   . Cardiac catheterization     History of 2 caths, reportedly normal    Family History  Problem Relation Age of Onset  . Lymphoma Mother     History  Substance Use Topics  . Smoking status: Former Smoker    Quit date:  12/25/1983  . Smokeless tobacco: Never Used  . Alcohol Use: No    OB History    Grav Para Term Preterm Abortions TAB SAB Ect Mult Living                  Review of Systems  Constitutional: Negative.   Eyes: Negative.   Respiratory: Negative.   Cardiovascular: Negative.   Gastrointestinal: Negative.   Genitourinary: Negative.   Musculoskeletal: Negative.   Skin: Negative.   Neurological: Positive for numbness and headaches.  Hematological: Negative.   Psychiatric/Behavioral: Negative.   All other systems reviewed and are negative.    Allergies  Epinephrine; Hydromorphone; Morphine and related; Phenergan; and Succinylcholine  Home Medications   Current Outpatient Rx  Name Route Sig Dispense Refill  . AMITRIPTYLINE HCL 10 MG PO TABS Oral Take 10 mg by mouth at bedtime.    Marland Kitchen AMLODIPINE BESYLATE 5 MG PO TABS Oral Take 1 tablet (5 mg total) by mouth daily. 30 tablet 11  . ASPIRIN 325 MG PO TABS Oral Take 325 mg by mouth 2 (two) times daily.     Marland Kitchen VITAMIN D-3 PO Oral Take by mouth daily.      Marland Kitchen CLONAZEPAM 2 MG PO TABS Oral Take 2 mg by mouth 3 (three) times daily as needed.      Marland Kitchen FLUOXETINE HCL 40 MG PO  CAPS Oral Take 80 mg by mouth daily.     . FUROSEMIDE 40 MG PO TABS Oral Take 40 mg by mouth every other day.     Marland Kitchen GABAPENTIN 300 MG PO CAPS Oral Take 300 mg by mouth 2 (two) times daily.     Marland Kitchen GLUCOSAMINE-CHONDROITIN 500-400 MG PO TABS Oral Take 1 tablet by mouth 2 (two) times daily.      . INSULIN GLARGINE 100 UNIT/ML [place] SOLN Subcutaneous Inject 30 Units into the skin at bedtime.     . L-METHYLFOLATE-B12-B6-B2 01-18-49-5 MG PO TABS Oral Take 1 tablet by mouth 2 (two) times daily.      Marland Kitchen LISINOPRIL 10 MG PO TABS Oral Take 2 tablets (20 mg total) by mouth daily. 60 tablet 11  . METFORMIN HCL 500 MG PO TABS Oral Take 500 mg by mouth 2 (two) times daily with a meal.      . NITROGLYCERIN 0.4 MG SL SUBL Sublingual Place 0.4 mg under the tongue every 5 (five) minutes as needed.        . OXYCODONE-ACETAMINOPHEN 5-325 MG PO TABS Oral Take 1 tablet by mouth every 4 (four) hours as needed.      Marland Kitchen POTASSIUM CHLORIDE CRYS ER 20 MEQ PO TBCR Oral Take 20 mEq by mouth daily.      Marland Kitchen PRAVASTATIN SODIUM 40 MG PO TABS Oral Take 80 mg by mouth at bedtime.       BP 178/64  Pulse 54  Temp(Src) 98.4 F (36.9 C) (Oral)  Resp 22  SpO2 99%  Physical Exam  Nursing note and vitals reviewed. Constitutional: She is oriented to person, place, and time. She appears well-developed and well-nourished. No distress.  HENT:  Head: Normocephalic and atraumatic.  Eyes: Conjunctivae and EOM are normal. Pupils are equal, round, and reactive to light.  Neck: Normal range of motion.  Cardiovascular: Normal rate, regular rhythm, normal heart sounds and intact distal pulses.  Exam reveals no gallop and no friction rub.   No murmur heard. Pulmonary/Chest: Effort normal and breath sounds normal. No respiratory distress. She has no wheezes. She has no rales.  Abdominal: Soft. Bowel sounds are normal. She exhibits no distension. There is no tenderness. There is no rebound and no guarding.  Musculoskeletal: Normal range of motion. She exhibits no edema.  Neurological: She is alert and oriented to person, place, and time. No cranial nerve deficit. She exhibits normal muscle tone. Coordination normal.       No abnormality on finger-to-nose testing bilaterally.  NIHSS 0  Skin: Skin is warm and dry. No rash noted. She is not diaphoretic.  Psychiatric:       Patient has very depressed affect and reports frequent anxiety.  She reports that she often has symptoms like she had today that are different than prior CVA symptoms when she has anxiety.      ED Course  Procedures (including critical care time)  Labs Reviewed  CBC - Abnormal; Notable for the following:    Hemoglobin 16.2 (*)    HCT 47.7 (*)    All other components within normal limits  COMPREHENSIVE METABOLIC PANEL - Abnormal; Notable for the  following:    Glucose, Bld 131 (*)    Creatinine, Ser 0.39 (*)    All other components within normal limits  GLUCOSE, CAPILLARY - Abnormal; Notable for the following:    Glucose-Capillary 139 (*)    All other components within normal limits  PROTIME-INR  APTT  DIFFERENTIAL  CK  TOTAL AND CKMB  URINALYSIS, ROUTINE W REFLEX MICROSCOPIC  POCT I-STAT TROPONIN I  LAB REPORT - SCANNED  POCT CBG MONITORING  I-STAT TROPONIN I   Ct Head Wo Contrast  09/04/2011  *RADIOLOGY REPORT*  Clinical Data: Right-sided weakness  CT HEAD WITHOUT CONTRAST  Technique:  Contiguous axial images were obtained from the base of the skull through the vertex without contrast.  Comparison: 12/14/2010  Findings: Tiny right basal ganglial lacunar infarct is stable. Near CSF density right thalamic lacunar infarct 14 is new since the prior study but otherwise age indeterminate. No acute hemorrhage, acute infarction, or mass lesion is seen. No midline shift.  No ventriculomegaly.  No sinus mucous retention cyst or polyp noted. No skull fracture.  Orbits and paranasal sinuses are otherwise unremarkable.  IMPRESSION: Age indeterminate right thalamic lacunar infarct.  Otherwise, no acute intracranial finding.  Original Report Authenticated By: Harrel Lemon, M.D.     1. Anxiety   2. Numbness       MDM  Patient presented with history of CVAs and reports of episode of decreased sensation that had resolved.  Glucose was WNL. Patient described symptoms with decreased sensation that began in her right fingertips and marched proximally and then across the midline without affecting her face.  This would be somewhat atypical for ischemic neurologic event.  Patient was quite hypertensive but was also very flustered from the events of the evening.  CT head was done and there was evidence of an age indeterminate R thalamic infarct.  I spoke with Waneta Martins the reading radiologist she felt that this was not acute.  Patient did not  have symptoms that would correlate with the lesion during her episode and she had an NIHSS of 0 here.  Patient BP trended down as she remained in the ED.  Remainder of work-up was negative with normal UA, negative TNI, unremarkable CBC, CMP, and INR.  EKG was ordered and performed but not recorded.  This was reviewed by myself at bedside with no acute ischemic changes.  This could not be retrieved at the time of documentation and further details are not available and could not be retrieved.  Headache was treated with patient's normal regimen of percocet and her BP trended down as her pain and anxiety decreased.  Patient  and family were notified of all findings.  Given that all of them really felt that all of this was ultimately related to anxiety and symptoms had resolved prior to arrival discharge home was felt appropriate at this time.  The patient will call her doctor to follow-up as she reports that she has suffered with these "spells" for some time but has never told her doctor.  She was discharged in good condition.  Patient and family were comfortable with the plan.         Cyndra Numbers, MD 09/05/11 [phone]

## 2011-09-07 ENCOUNTER — Other Ambulatory Visit: Payer: Self-pay | Admitting: Neurology

## 2011-09-07 DIAGNOSIS — G459 Transient cerebral ischemic attack, unspecified: Secondary | ICD-10-CM

## 2011-09-12 ENCOUNTER — Ambulatory Visit
Admission: RE | Admit: 2011-09-12 | Discharge: 2011-09-12 | Disposition: A | Discharge status: Home / Self Care | Payer: Medicare Other | Source: Ambulatory Visit | Attending: Neurology | Admitting: Neurology

## 2011-09-12 DIAGNOSIS — G459 Transient cerebral ischemic attack, unspecified: Secondary | ICD-10-CM

## 2011-09-21 HISTORY — PX: OTHER SURGICAL HISTORY: SHX169

## 2011-10-01 ENCOUNTER — Emergency Department (INDEPENDENT_AMBULATORY_CARE_PROVIDER_SITE_OTHER): Payer: Medicare Other

## 2011-10-01 ENCOUNTER — Encounter (HOSPITAL_COMMUNITY): Payer: Self-pay | Admitting: *Deleted

## 2011-10-01 ENCOUNTER — Emergency Department (INDEPENDENT_AMBULATORY_CARE_PROVIDER_SITE_OTHER)
Admission: EM | Admit: 2011-10-01 | Discharge: 2011-10-01 | Disposition: A | Discharge status: Home / Self Care | Payer: Medicare Other | Source: Home / Self Care | Attending: Family Medicine | Admitting: Family Medicine

## 2011-10-01 DIAGNOSIS — M25569 Pain in unspecified knee: Secondary | ICD-10-CM

## 2011-10-01 DIAGNOSIS — M25561 Pain in right knee: Secondary | ICD-10-CM

## 2011-10-01 HISTORY — DX: Major depressive disorder, single episode, unspecified: F32.9

## 2011-10-01 HISTORY — DX: Hyperlipidemia, unspecified: E78.5

## 2011-10-01 HISTORY — DX: Nontoxic goiter, unspecified: E04.9

## 2011-10-01 HISTORY — DX: Depression, unspecified: F32.A

## 2011-10-01 HISTORY — DX: Dorsalgia, unspecified: M54.9

## 2011-10-01 HISTORY — DX: Basal cell carcinoma of skin of nose: C44.311

## 2011-10-01 HISTORY — DX: Nontoxic single thyroid nodule: E04.1

## 2011-10-01 HISTORY — DX: Other chronic pain: G89.29

## 2011-10-01 HISTORY — DX: Cervicalgia: M54.2

## 2011-10-01 NOTE — ED Notes (Signed)
Pt reports fall last night while trying to help lift her sister up that had fallen; describes RLE "went out to the side when I tried to lift her, and then she fell on top of my right knee at the same time".  States pain was minimal initially, but has gotten progressively worse.  Has taken her normal dose of oxycodone without relief.  Has applied ice.  C/O tenderness mostly to lateral and posterior knee.  Normally ambulates with cane.  Difficulty bearing weight on RLE due to pain.  Swelling noted.

## 2011-10-01 NOTE — ED Provider Notes (Signed)
History     CSN: [id number]  Arrival date & time 10/01/11  1819   First MD Initiated Contact with Patient 10/01/11 2015      Chief Complaint  Patient presents with  . Fall  . Knee Pain    (Consider location/radiation/quality/duration/timing/severity/associated sxs/prior treatment) HPI Comments: Patient reports pain in her right knee.  States she has chronic pain in her right knee and needs a knee replacement but Dr Charlann Boxer, her orthopedist, has requested better control of her blood sugars prior to surgery.  States that yesterday she was attempting to pick up her sister who had fallen, and her sister fell on her, injuring her right knee.  States she feels that her knee bent sideways to one side (which one she is not sure) and since then she has had increased pain.  Has been taking her home Percocet 10s without relief.  States she cannot take NSAIDs because they hurt her stomach.  Denies other injury.  Denies hitting head or LOC.  Denies back pain or headache. Pt usually walks at home with "2 ski poles"  Currently is walking with a cane.    Patient is a 70 y.o. female presenting with fall and knee pain. The history is provided by the patient.  Fall  Knee Pain    Past Medical History  Diagnosis Date  . Hypertension   . Diabetes mellitus     Insulin dependent  . Chest pain, non-cardiac     History of 2 normal cardiac catheterizations  . Bradycardia   . Dizziness     Chronic  . Fibromyalgia   . Neuropathy   . Stroke   . Chronic chest pain   . Hyperlipidemia   . Chronic neck pain   . Chronic back pain   . Basal cell carcinoma of nose   . Depression   . Thyroid cyst   . Goiter     Past Surgical History  Procedure Date  . Total knee arthroplasty   . Cardiac catheterization     History of 2 caths, reportedly normal  . Joint replacement   . Rotator cuff repair   . Appendectomy   . Cystectomy   . Thyroid surgery   . Breast lumpectomy   . Abdominal hysterectomy   . Biceps  tendon repair     Family History  Problem Relation Age of Onset  . Lymphoma Mother     History  Substance Use Topics  . Smoking status: Former Smoker    Quit date: 12/25/1983  . Smokeless tobacco: Never Used  . Alcohol Use: No    OB History    Grav Para Term Preterm Abortions TAB SAB Ect Mult Living                  Review of Systems  All other systems reviewed and are negative.    Allergies  Epinephrine; Hydromorphone; Morphine and related; Phenergan; and Succinylcholine  Home Medications   Current Outpatient Rx  Name Route Sig Dispense Refill  . AMITRIPTYLINE HCL 10 MG PO TABS Oral Take 10 mg by mouth at bedtime.    Marland Kitchen AMLODIPINE BESYLATE 5 MG PO TABS Oral Take 1 tablet (5 mg total) by mouth daily. 30 tablet 11  . ASPIRIN 325 MG PO TABS Oral Take 325 mg by mouth 2 (two) times daily.     Marland Kitchen VITAMIN D-3 PO Oral Take by mouth daily.      Marland Kitchen CLONAZEPAM 2 MG PO TABS Oral Take 2 mg  by mouth 3 (three) times daily as needed.      Marland Kitchen FLUOXETINE HCL 40 MG PO CAPS Oral Take 80 mg by mouth daily.     Marland Kitchen GABAPENTIN 300 MG PO CAPS Oral Take 300 mg by mouth 2 (two) times daily.     Marland Kitchen GLUCOSAMINE-CHONDROITIN 500-400 MG PO TABS Oral Take 1 tablet by mouth 2 (two) times daily.      . INSULIN GLARGINE 100 UNIT/ML [place] SOLN Subcutaneous Inject 30 Units into the skin at bedtime.     . L-METHYLFOLATE-B12-B6-B2 01-18-49-5 MG PO TABS Oral Take 1 tablet by mouth 2 (two) times daily.      Marland Kitchen LISINOPRIL 10 MG PO TABS Oral Take 2 tablets (20 mg total) by mouth daily. 60 tablet 11  . METFORMIN HCL 500 MG PO TABS Oral Take 500 mg by mouth 2 (two) times daily with a meal.      . NITROGLYCERIN 0.4 MG SL SUBL Sublingual Place 0.4 mg under the tongue every 5 (five) minutes as needed.      . OXYCODONE-ACETAMINOPHEN 10-325 MG PO TABS Oral Take 1 tablet by mouth every 4 (four) hours as needed.    Marland Kitchen PRAVASTATIN SODIUM 40 MG PO TABS Oral Take 80 mg by mouth at bedtime.     . FUROSEMIDE 40 MG PO TABS Oral Take 40  mg by mouth every other day.     . OXYCODONE-ACETAMINOPHEN 5-325 MG PO TABS Oral Take 1 tablet by mouth every 4 (four) hours as needed.     Marland Kitchen POTASSIUM CHLORIDE CRYS ER 20 MEQ PO TBCR Oral Take 20 mEq by mouth daily.        There were no vitals taken for this visit.  Physical Exam  Constitutional: She is oriented to person, place, and time. She appears well-developed and well-nourished.  HENT:  Head: Normocephalic and atraumatic.  Neck: Neck supple.  Pulmonary/Chest: Effort normal.  Musculoskeletal:       Right knee: She exhibits swelling. She exhibits no ecchymosis, no laceration, no erythema, normal alignment, no LCL laxity and no bony tenderness. tenderness found. No medial joint line, no lateral joint line, no MCL, no LCL and no patellar tendon tenderness noted.       Diffuse mild tenderness to right knee.  Joint is stable without laxity.  Patient does resist exam.  Distal pulses intact.  Sensation intact.    Neurological: She is alert and oriented to person, place, and time.    ED Course  Procedures (including critical care time)  Labs Reviewed - No data to display Dg Knee Complete 4 Views Right  10/01/2011  *RADIOLOGY REPORT*  Clinical Data: Knee pain secondary to a fall.  RIGHT KNEE - COMPLETE 4+ VIEW  Comparison: None.  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 No joint effusion. No acute osseous abnormality.  IMPRESSION: Tricompartmental osteoarthritis.  No acute abnormality.  Original Report Authenticated By: Gwynn Burly, M.D.     1. Pain in right knee       MDM  Patient with chronic right knee pain due to advanced arthritis with new injury yesterday to knee and uncontrolled pain at home.  Pt is ambulatory.  Xray showed no acute abnormality.  Patient given knee sleeve and RICE instructions, recommendations to follow up with Dr Charlann Boxer.  I did not change her home pain medication as she is on a very high  dose pain medication and patient states she has been on "everything" -  it seems more appropriate for this exacerbation of chronic pain to be managed by Dr Charlann Boxer. Patient declined pain medication in the department.  Patient verbalizes understanding and agrees with plan.          Dillard Cannon [place], [place] 10/01/11 2150

## 2011-10-03 NOTE — ED Provider Notes (Signed)
Medical screening examination/treatment/procedure(s) were performed by non-physician practitioner and as supervising physician I was immediately available for consultation/collaboration.   [facility]; MD   Sharin Grave, MD 10/03/11 [phone]

## 2012-01-10 ENCOUNTER — Other Ambulatory Visit: Payer: Self-pay | Admitting: Nurse Practitioner

## 2012-08-18 ENCOUNTER — Other Ambulatory Visit: Payer: Self-pay

## 2012-08-18 MED ORDER — LISINOPRIL 10 MG PO TABS: 10.0000 mg | ORAL_TABLET | Freq: Every day | ORAL | Status: DC

## 2012-10-03 ENCOUNTER — Ambulatory Visit: Payer: Medicare Other | Admitting: Cardiovascular Disease

## 2012-10-14 ENCOUNTER — Encounter: Payer: Self-pay | Admitting: Cardiovascular Disease

## 2013-01-02 ENCOUNTER — Other Ambulatory Visit: Payer: Self-pay

## 2013-01-05 MED ORDER — LISINOPRIL 10 MG PO TABS: 10.0000 mg | ORAL_TABLET | Freq: Every day | ORAL | Status: DC

## 2013-01-05 NOTE — Telephone Encounter (Signed)
PT WAS GIVEN 15 DAYS, PT NO SHOWED FOR FOLLOW UP PT NOT SEEN SINCE 2012

## 2013-03-30 ENCOUNTER — Telehealth: Payer: Self-pay | Admitting: Neurology

## 2013-03-30 MED ORDER — AMITRIPTYLINE HCL 10 MG PO TABS: 10.0000 mg | ORAL_TABLET | Freq: Every day | ORAL | Status: DC

## 2013-03-30 MED ORDER — GABAPENTIN 300 MG PO CAPS: 600.0000 mg | ORAL_CAPSULE | Freq: Three times a day (TID) | ORAL | Status: DC

## 2013-03-31 NOTE — Telephone Encounter (Signed)
Called patient. Having severe migraines and has had several falls. She said this has been going on for years, but seems to be getting worse. Patient is a former Dr. [name] patient who was referred for HA. C/o head pain (6 on pain scale), and pain all over her body. Says pain specialist will not prescribe anymore pain meds. Would like the earliest appt with Dr. [name]. Advised patient of Dr. [name] schedule and envouraged PCP, Urgent Care, or ED, for urgent matters. Patient agreed.

## 2013-06-23 ENCOUNTER — Other Ambulatory Visit: Payer: Self-pay | Admitting: Neurology

## 2013-07-03 ENCOUNTER — Other Ambulatory Visit: Payer: Self-pay | Admitting: Neurology

## 2013-07-31 ENCOUNTER — Telehealth: Payer: Self-pay | Admitting: Neurology

## 2013-07-31 NOTE — Telephone Encounter (Signed)
NEEDS APPT-DR. LOVE PT

## 2013-07-31 NOTE — Telephone Encounter (Signed)
PLEASE CALL [phone] INSTEAD OF [phone]

## 2013-07-31 NOTE — Telephone Encounter (Signed)
Sent to [facility] Crank[facility] for reassignment

## 2013-08-30 ENCOUNTER — Emergency Department (HOSPITAL_COMMUNITY): Payer: Medicare Other

## 2013-08-30 ENCOUNTER — Other Ambulatory Visit: Payer: Self-pay

## 2013-08-30 ENCOUNTER — Encounter (HOSPITAL_COMMUNITY): Payer: Self-pay | Admitting: Emergency Medicine

## 2013-08-30 ENCOUNTER — Inpatient Hospital Stay (HOSPITAL_COMMUNITY)
Admission: EM | Admit: 2013-08-30 | Discharge: 2013-09-01 | DRG: 865 | Disposition: A | Discharge status: Home / Self Care | Payer: Medicare Other | Attending: Internal Medicine | Admitting: Internal Medicine

## 2013-08-30 DIAGNOSIS — Z91040 Latex allergy status: Secondary | ICD-10-CM

## 2013-08-30 DIAGNOSIS — F32A Depression, unspecified: Secondary | ICD-10-CM

## 2013-08-30 DIAGNOSIS — E785 Hyperlipidemia, unspecified: Secondary | ICD-10-CM | POA: Diagnosis present

## 2013-08-30 DIAGNOSIS — Z888 Allergy status to other drugs, medicaments and biological substances status: Secondary | ICD-10-CM

## 2013-08-30 DIAGNOSIS — R6510 Systemic inflammatory response syndrome (SIRS) of non-infectious origin without acute organ dysfunction: Secondary | ICD-10-CM | POA: Diagnosis present

## 2013-08-30 DIAGNOSIS — G9340 Encephalopathy, unspecified: Secondary | ICD-10-CM | POA: Diagnosis present

## 2013-08-30 DIAGNOSIS — Z807 Family history of other malignant neoplasms of lymphoid, hematopoietic and related tissues: Secondary | ICD-10-CM

## 2013-08-30 DIAGNOSIS — M542 Cervicalgia: Secondary | ICD-10-CM | POA: Diagnosis present

## 2013-08-30 DIAGNOSIS — IMO0001 Reserved for inherently not codable concepts without codable children: Secondary | ICD-10-CM | POA: Diagnosis present

## 2013-08-30 DIAGNOSIS — M797 Fibromyalgia: Secondary | ICD-10-CM | POA: Diagnosis present

## 2013-08-30 DIAGNOSIS — A419 Sepsis, unspecified organism: Secondary | ICD-10-CM | POA: Diagnosis present

## 2013-08-30 DIAGNOSIS — E1165 Type 2 diabetes mellitus with hyperglycemia: Secondary | ICD-10-CM

## 2013-08-30 DIAGNOSIS — Z79899 Other long term (current) drug therapy: Secondary | ICD-10-CM

## 2013-08-30 DIAGNOSIS — G8929 Other chronic pain: Secondary | ICD-10-CM | POA: Diagnosis present

## 2013-08-30 DIAGNOSIS — Z7982 Long term (current) use of aspirin: Secondary | ICD-10-CM

## 2013-08-30 DIAGNOSIS — E860 Dehydration: Secondary | ICD-10-CM | POA: Diagnosis present

## 2013-08-30 DIAGNOSIS — B9789 Other viral agents as the cause of diseases classified elsewhere: Principal | ICD-10-CM | POA: Diagnosis present

## 2013-08-30 DIAGNOSIS — G589 Mononeuropathy, unspecified: Secondary | ICD-10-CM | POA: Diagnosis present

## 2013-08-30 DIAGNOSIS — I10 Essential (primary) hypertension: Secondary | ICD-10-CM | POA: Diagnosis present

## 2013-08-30 DIAGNOSIS — R509 Fever, unspecified: Secondary | ICD-10-CM

## 2013-08-30 DIAGNOSIS — Z85828 Personal history of other malignant neoplasm of skin: Secondary | ICD-10-CM

## 2013-08-30 DIAGNOSIS — R4182 Altered mental status, unspecified: Secondary | ICD-10-CM | POA: Diagnosis present

## 2013-08-30 DIAGNOSIS — B379 Candidiasis, unspecified: Secondary | ICD-10-CM | POA: Diagnosis present

## 2013-08-30 DIAGNOSIS — F329 Major depressive disorder, single episode, unspecified: Secondary | ICD-10-CM | POA: Diagnosis present

## 2013-08-30 DIAGNOSIS — Z8673 Personal history of transient ischemic attack (TIA), and cerebral infarction without residual deficits: Secondary | ICD-10-CM

## 2013-08-30 DIAGNOSIS — M549 Dorsalgia, unspecified: Secondary | ICD-10-CM | POA: Diagnosis present

## 2013-08-30 DIAGNOSIS — F3289 Other specified depressive episodes: Secondary | ICD-10-CM | POA: Diagnosis present

## 2013-08-30 DIAGNOSIS — Z885 Allergy status to narcotic agent status: Secondary | ICD-10-CM

## 2013-08-30 DIAGNOSIS — Z794 Long term (current) use of insulin: Secondary | ICD-10-CM

## 2013-08-30 DIAGNOSIS — Z96659 Presence of unspecified artificial knee joint: Secondary | ICD-10-CM

## 2013-08-30 DIAGNOSIS — Z87891 Personal history of nicotine dependence: Secondary | ICD-10-CM

## 2013-08-30 DIAGNOSIS — E119 Type 2 diabetes mellitus without complications: Secondary | ICD-10-CM | POA: Diagnosis present

## 2013-08-30 LAB — URINALYSIS, ROUTINE W REFLEX MICROSCOPIC
Bilirubin Urine: NEGATIVE
Bilirubin Urine: NEGATIVE
Glucose, UA: >1000 mg/dL — AB
Glucose, UA: >1000 mg/dL — AB
HGB URINE DIPSTICK: NEGATIVE
Hgb urine dipstick: NEGATIVE
Ketones, ur: >80 mg/dL — AB
Leukocytes, UA: NEGATIVE
Leukocytes, UA: NEGATIVE
NITRITE: NEGATIVE
NITRITE: NEGATIVE
PH: 8.5 — AB (ref 5.0–8.0)
Protein, ur: 30 mg/dL — AB
Protein, ur: 30 mg/dL — AB
SPECIFIC GRAVITY, URINE: 1.031 — AB (ref 1.005–1.030)
Specific Gravity, Urine: 1.026 (ref 1.005–1.030)
UROBILINOGEN UA: 1 mg/dL (ref 0.0–1.0)
Urobilinogen, UA: 1 mg/dL (ref 0.0–1.0)
pH: 7 (ref 5.0–8.0)

## 2013-08-30 LAB — RAPID URINE DRUG SCREEN, HOSP PERFORMED
AMPHETAMINES: NOT DETECTED
BENZODIAZEPINES: NOT DETECTED
Barbiturates: NOT DETECTED
COCAINE: NOT DETECTED
Opiates: NOT DETECTED
TETRAHYDROCANNABINOL: NOT DETECTED

## 2013-08-30 LAB — CBC
HCT: 48 % — ABNORMAL HIGH (ref 36.0–46.0)
HEMOGLOBIN: 16.5 g/dL — AB (ref 12.0–15.0)
MCH: 32 pg (ref 26.0–34.0)
MCHC: 34.4 g/dL (ref 30.0–36.0)
MCV: 93 fL (ref 78.0–100.0)
PLATELETS: 327 10*3/uL (ref 150–400)
RBC: 5.16 MIL/uL — AB (ref 3.87–5.11)
RDW: 12.1 % (ref 11.5–15.5)
WBC: 14.3 10*3/uL — ABNORMAL HIGH (ref 4.0–10.5)

## 2013-08-30 LAB — COMPREHENSIVE METABOLIC PANEL
ALK PHOS: 118 U/L — AB (ref 39–117)
ALT: 14 U/L (ref 0–35)
AST: 22 U/L (ref 0–37)
Albumin: 4.2 g/dL (ref 3.5–5.2)
BUN: 12 mg/dL (ref 6–23)
CO2: 25 meq/L (ref 19–32)
Calcium: 8.8 mg/dL (ref 8.4–10.5)
Chloride: 93 mEq/L — ABNORMAL LOW (ref 96–112)
Creatinine, Ser: 0.38 mg/dL — ABNORMAL LOW (ref 0.50–1.10)
GFR calc non Af Amer: >90 mL/min (ref >90)
GLUCOSE: 254 mg/dL — AB (ref 70–99)
Potassium: 3.9 mEq/L (ref 3.7–5.3)
SODIUM: 136 meq/L — AB (ref 137–147)
Total Bilirubin: 0.7 mg/dL (ref 0.3–1.2)
Total Protein: 7.2 g/dL (ref 6.0–8.3)

## 2013-08-30 LAB — URINE MICROSCOPIC-ADD ON

## 2013-08-30 LAB — CG4 I-STAT (LACTIC ACID): LACTIC ACID, VENOUS: 1.33 mmol/L (ref 0.5–2.2)

## 2013-08-30 LAB — GLUCOSE, CAPILLARY: Glucose-Capillary: 243 mg/dL — ABNORMAL HIGH (ref 70–99)

## 2013-08-30 MED ORDER — ONDANSETRON HCL 4 MG/2ML IJ SOLN
4.0000 mg | Freq: Once | INTRAMUSCULAR | Status: AC
  Administered 2013-08-30: 4 mg via INTRAVENOUS
  Filled 2013-08-30: qty 2

## 2013-08-30 MED ORDER — HALOPERIDOL LACTATE 5 MG/ML IJ SOLN
5.0000 mg | Freq: Once | INTRAMUSCULAR | Status: AC
  Administered 2013-08-30: 5 mg via INTRAVENOUS

## 2013-08-30 MED ORDER — ACETAMINOPHEN 650 MG RE SUPP
650.0000 mg | Freq: Once | RECTAL | Status: AC
  Administered 2013-08-30: 650 mg via RECTAL
  Filled 2013-08-30: qty 1

## 2013-08-30 MED ORDER — SODIUM CHLORIDE 0.9 % IV BOLUS (SEPSIS)
1000.0000 mL | Freq: Once | INTRAVENOUS | Status: AC
  Administered 2013-08-30: 1000 mL via INTRAVENOUS

## 2013-08-30 MED ORDER — VANCOMYCIN HCL IN DEXTROSE 1-5 GM/200ML-% IV SOLN
1000.0000 mg | Freq: Once | INTRAVENOUS | Status: AC
  Administered 2013-08-30: 1000 mg via INTRAVENOUS
  Filled 2013-08-30: qty 200

## 2013-08-30 MED ORDER — PIPERACILLIN-TAZOBACTAM 3.375 G IVPB 30 MIN
3.3750 g | Freq: Once | INTRAVENOUS | Status: AC
  Administered 2013-08-30: 3.375 g via INTRAVENOUS
  Filled 2013-08-30: qty 50

## 2013-08-30 MED ORDER — DIPHENHYDRAMINE HCL 50 MG/ML IJ SOLN
25.0000 mg | Freq: Once | INTRAMUSCULAR | Status: AC
  Administered 2013-08-30: 25 mg via INTRAVENOUS
  Filled 2013-08-30: qty 1

## 2013-08-30 MED ORDER — PIPERACILLIN-TAZOBACTAM 3.375 G IVPB
3.3750 g | Freq: Three times a day (TID) | INTRAVENOUS | Status: DC
  Administered 2013-08-31 (×2): 3.375 g via INTRAVENOUS
  Filled 2013-08-30 (×3): qty 50

## 2013-08-30 MED ORDER — SODIUM CHLORIDE 0.9 % IV SOLN: 1000.0000 mL | INTRAVENOUS | Status: DC

## 2013-08-30 MED ORDER — SODIUM CHLORIDE 0.9 % IV SOLN: 1000.0000 mL | Freq: Once | INTRAVENOUS | Status: DC

## 2013-08-30 MED ORDER — LORAZEPAM 2 MG/ML IJ SOLN
1.0000 mg | Freq: Once | INTRAMUSCULAR | Status: AC
  Administered 2013-08-30: 1 mg via INTRAVENOUS
  Filled 2013-08-30 (×2): qty 1

## 2013-08-30 MED ORDER — VANCOMYCIN HCL IN DEXTROSE 1-5 GM/200ML-% IV SOLN
1000.0000 mg | Freq: Two times a day (BID) | INTRAVENOUS | Status: DC
  Administered 2013-08-31: 1000 mg via INTRAVENOUS
  Filled 2013-08-30 (×2): qty 200

## 2013-08-30 MED ORDER — VANCOMYCIN HCL 500 MG IV SOLR
500.0000 mg | Freq: Once | INTRAVENOUS | Status: AC
  Administered 2013-08-31: 500 mg via INTRAVENOUS
  Filled 2013-08-30: qty 500

## 2013-08-30 NOTE — ED Provider Notes (Signed)
CSN: [id number]     Arrival date & time 08/30/13  88 History   First MD Initiated Contact with Patient 08/30/13 1704     Chief Complaint  Patient presents with  . Altered Mental Status   (Consider location/radiation/quality/duration/timing/severity/associated sxs/prior Treatment) Patient is a 73 y.o. female presenting with altered mental status.  Altered Mental Status Presenting symptoms: behavior changes, confusion and disorientation   Severity:  Moderate Most recent episode:  Today Episode history:  Continuous Timing:  Constant Chronicity:  New Context: not dementia, not a nursing home resident and not a recent illness   Associated symptoms: fever   Associated symptoms: no abdominal pain, no nausea and no vomiting      Past Medical History  Diagnosis Date  . Diabetes mellitus     Insulin dependent  . Chest pain, non-cardiac     History of 2 normal cardiac catheterizations  . Bradycardia   . Dizziness     Chronic  . Fibromyalgia   . Neuropathy   . Chronic chest pain   . Hyperlipidemia   . Chronic neck pain   . Chronic back pain   . Basal cell carcinoma of nose   . Depression   . Thyroid cyst   . Goiter   . Stroke   . Hypertension    Past Surgical History  Procedure Laterality Date  . Total knee arthroplasty    . Cardiac catheterization      History of 2 caths, reportedly normal  . Joint replacement    . Rotator cuff repair    . Appendectomy    . Cystectomy    . Thyroid surgery    . Breast lumpectomy    . Abdominal hysterectomy    . Biceps tendon repair    . Refractive surgery    . Right nares      basal cell surgery   Family History  Problem Relation Age of Onset  . Lymphoma Mother   . Diabetic kidney disease Mother   . Diabetic kidney disease Brother   . Heart disease Sister   . Cancer Sister   . Heart attack Brother   . Myasthenia gravis Brother    History  Substance Use Topics  . Smoking status: Former Smoker    Quit date: 12/25/1983  .  Smokeless tobacco: Never Used  . Alcohol Use: No   OB History   Grav Para Term Preterm Abortions TAB SAB Ect Mult Living                 Review of Systems  Unable to perform ROS: Mental status change  Constitutional: Positive for fever.  Gastrointestinal: Negative for nausea, vomiting and abdominal pain.  Psychiatric/Behavioral: Positive for confusion.    Allergies  Epinephrine; Hydromorphone; Morphine and related; Phenergan; Succinylcholine; and Latex  Home Medications   Current Outpatient Rx  Name  Route  Sig  Dispense  Refill  . amitriptyline (ELAVIL) 10 MG tablet   Oral   Take 10 mg by mouth at bedtime as needed for sleep.         Marland Kitchen aspirin 325 MG tablet   Oral   Take 325 mg by mouth 2 (two) times daily.          . Cholecalciferol (VITAMIN D-3 PO)   Oral   Take by mouth daily.           . clonazePAM (KLONOPIN) 2 MG tablet   Oral   Take 2 mg by mouth 3 (three) times  daily as needed.           Marland Kitchen FLUoxetine (PROZAC) 40 MG capsule   Oral   Take 80 mg by mouth daily.          . furosemide (LASIX) 40 MG tablet   Oral   Take 40 mg by mouth every other day.          . gabapentin (NEURONTIN) 300 MG capsule   Oral   Take 600 mg by mouth 3 (three) times daily.         Marland Kitchen EXPIRED: amLODipine (NORVASC) 5 MG tablet   Oral   Take 1 tablet (5 mg total) by mouth daily.   30 tablet   11   . glucosamine-chondroitin 500-400 MG tablet   Oral   Take 1 tablet by mouth 2 (two) times daily.           . insulin glargine (LANTUS) 100 UNIT/ML injection   Subcutaneous   Inject 30 Units into the skin at bedtime.          Marland Kitchen l-methylfolate-b2-b6-b12 (CEREFOLIN) 01-18-49-5 MG TABS   Oral   Take 1 tablet by mouth 2 (two) times daily.           Marland Kitchen lisinopril (PRINIVIL,ZESTRIL) 10 MG tablet   Oral   Take 1 tablet (10 mg total) by mouth daily.   15 tablet   0     Pt will need to make an appt to receive any furthe ...   . metFORMIN (GLUCOPHAGE) 500 MG  tablet   Oral   Take 500 mg by mouth 2 (two) times daily with a meal.           . nitroGLYCERIN (NITROSTAT) 0.4 MG SL tablet   Sublingual   Place 0.4 mg under the tongue every 5 (five) minutes as needed.           Marland Kitchen oxyCODONE-acetaminophen (PERCOCET) 10-325 MG per tablet   Oral   Take 1 tablet by mouth every 4 (four) hours as needed.         Marland Kitchen oxyCODONE-acetaminophen (PERCOCET) 5-325 MG per tablet   Oral   Take 1 tablet by mouth every 4 (four) hours as needed.          . potassium chloride SA (K-DUR,KLOR-CON) 20 MEQ tablet   Oral   Take 20 mEq by mouth daily.           . pravastatin (PRAVACHOL) 40 MG tablet   Oral   Take 80 mg by mouth at bedtime.           BP 159/57  Pulse 111  Temp(Src) 102.2 F (39 C) (Rectal)  Resp 27  SpO2 93% Physical Exam  Constitutional: She appears well-developed and well-nourished. No distress.  HENT:  Head: Normocephalic and atraumatic.  Eyes: Pupils are equal, round, and reactive to light. Right eye exhibits no discharge. Left eye exhibits no discharge.  Neck: Normal range of motion.  Cardiovascular: Normal rate, regular rhythm and normal heart sounds.   Pulmonary/Chest: Effort normal and breath sounds normal.  Abdominal: Soft. She exhibits no distension. There is no tenderness.  Musculoskeletal: Normal range of motion.  Neurological: She is alert. She is disoriented. GCS eye subscore is 4. GCS verbal subscore is 4. GCS motor subscore is 6.  Skin: Skin is warm. She is not diaphoretic.  Psychiatric: Her mood appears anxious. She is agitated. She is not combative. She is inattentive.    ED Course  Procedures (including  critical care time) Labs Review Labs Reviewed  CBC - Abnormal; Notable for the following:    WBC 14.3 (*)    RBC 5.16 (*)    Hemoglobin 16.5 (*)    HCT 48.0 (*)    All other components within normal limits  COMPREHENSIVE METABOLIC PANEL - Abnormal; Notable for the following:    Sodium 136 (*)    Chloride  93 (*)    Glucose, Bld 254 (*)    Creatinine, Ser 0.38 (*)    Alkaline Phosphatase 118 (*)    All other components within normal limits  URINALYSIS, ROUTINE W REFLEX MICROSCOPIC - Abnormal; Notable for the following:    APPearance CLOUDY (*)    pH 8.5 (*)    Glucose, UA >1000 (*)    Ketones, ur >80 (*)    Protein, ur 30 (*)    All other components within normal limits  GLUCOSE, CAPILLARY - Abnormal; Notable for the following:    Glucose-Capillary 243 (*)    All other components within normal limits  URINE MICROSCOPIC-ADD ON - Abnormal; Notable for the following:    Squamous Epithelial / LPF FEW (*)    Bacteria, UA FEW (*)    All other components within normal limits  URINALYSIS, ROUTINE W REFLEX MICROSCOPIC - Abnormal; Notable for the following:    Specific Gravity, Urine 1.031 (*)    Glucose, UA >1000 (*)    Ketones, ur >80 (*)    Protein, ur 30 (*)    All other components within normal limits  CULTURE, BLOOD (ROUTINE X 2)  CULTURE, BLOOD (ROUTINE X 2)  URINE RAPID DRUG SCREEN (HOSP PERFORMED)  URINE MICROSCOPIC-ADD ON  INFLUENZA PANEL BY PCR (TYPE A & B, H1N1)  URINALYSIS, ROUTINE W REFLEX MICROSCOPIC  LACTIC ACID, PLASMA  CORTISOL-AM, BLOOD  CG4 I-STAT (LACTIC ACID)   Imaging Review Ct Head Wo Contrast  08/30/2013   CLINICAL DATA:  Altered mental status  EXAM: CT HEAD WITHOUT CONTRAST  TECHNIQUE: Contiguous axial images were obtained from the base of the skull through the vertex without intravenous contrast.  COMPARISON:  09/04/2011  FINDINGS: The bony calvarium is intact. The ventricles are of normal size and configuration. No findings to suggest acute hemorrhage, acute infarction or space-occupying mass lesion are noted.  IMPRESSION: No acute abnormality noted.   Electronically Signed   By: Inez Catalina M.D.   On: 08/30/2013 20:35   Dg Chest Port 1 View  08/30/2013   CLINICAL DATA:  Shortness of breath  EXAM: PORTABLE CHEST - 1 VIEW  COMPARISON:  12/12/2010  FINDINGS:  Right lateral decubitus imaging performed due to patient's inability to be positioned upright. There is exclusion of the peripheral right chest as a result. Differential prominence of vascular markings and lung aeration is likely positional. Borderline cardiomegaly, size and morphology grossly stable from prior. No visible effusion or pneumothorax.  IMPRESSION: When accounting for decubitus positioning, no overt edema or consolidation.   Electronically Signed   By: Jorje Guild M.D.   On: 08/30/2013 21:16    EKG Interpretation   None       MDM   1. Fever   2. Altered mental status    73 yo F with no sig PMHx who presents with altered mental status x1 day.   History limited 2/2 patient's status, but per the family, she is normal, able to perform ADLs, and even assists her ailing sister in her care. Upon arrival, patient agitated, not following directions. Haldol given,  which caused patient to be more agitated. Ativan given, which improved symptoms. Rectal temp elevated. Will perform rule out sepsis, will get CT once agitation decreased.   Patient with leukocytosis. Given fever and increased WBC, will start broad spectrum ATBs. Hospitalist consulted for admission. LP held at this time given patient's agitation. Anticipate admission to hospitalist in stable condition. Patient seen and evaluated by myself and my attending, Dr. Ashok Cordia.      Freddi Che, MD 08/31/13 Benancio Deeds

## 2013-08-30 NOTE — ED Notes (Signed)
Son at [facility]. NT at [facility] as sitter. VSS. Pt remains restless, trying to get up out of bed, confused, oriented to person. follows direst short term commands (squeeze hands, open eyes), does not follow other commands (be still, don't move, remain in bed), alert, NAD.

## 2013-08-30 NOTE — ED Notes (Signed)
Daughter called EMS stating that her mother was wondering around the house and not her "normal self".  Normally she takes care of her sick sister without assistance.  BP 185/100 cbg 92.  Occipital headache, nausea and vomiting.  No other focal, neuro s/s per EMS.  Pt has had URI s/s for several weeks with a persistent cough. Pt presently thinks it's April and is "fidgity".

## 2013-08-30 NOTE — ED Notes (Signed)
Back from CT, tolerated well, xray called for pCXR, lab at [facility], no changes.

## 2013-08-30 NOTE — ED Notes (Signed)
Pt hr increased and pt face turned red with hr in 120's.  Dr Silvio Clayman notified and benadryl given.

## 2013-08-30 NOTE — ED Notes (Signed)
Resting calm & still at this time, will attempt imaging. To CT.

## 2013-08-30 NOTE — ED Notes (Addendum)
Pt knows where she is and how old she is, though still does not know time.  Per daughter, pt has been taking care of sister who has recently fallen ill and daughter states she is exhausted.  Also states pt took a narcotic pain pill this afternoon.

## 2013-08-30 NOTE — ED Notes (Signed)
Pt acting worse after haldol.  Dr Silvio Clayman notified and states to give ativan.

## 2013-08-30 NOTE — Progress Notes (Signed)
ANTIBIOTIC CONSULT NOTE - INITIAL  Pharmacy Consult for vancomycin/zosyn Indication: sepsis  Allergies  Allergen Reactions  . Epinephrine     Tachycardia when used as a local anesthetic  . Hydromorphone     UNKNOWN  . Morphine And Related     UNKNOWN  . Phenergan [Promethazine Hcl]     " GOES WACK YOU WITH THIS MEDICATION" STATEMENT MADE BY PATIENT  . Succinylcholine   . Latex Rash    Patient Measurements: Wt= 210 lbs or 95kg  (estimated)  Vital Signs: Temp: 102.2 F (39 C) (01/11 1954) Temp src: Rectal (01/11 1954) BP: 185/63 mmHg (01/11 1954) Pulse Rate: 111 (01/11 1954) Intake/Output from previous day:   Intake/Output from this shift: Total I/O In: 1000 [I.V.:1000] Out: -   Labs:  Recent Labs  08/30/13 1828  WBC 14.3*  HGB 16.5*  PLT 327  CREATININE 0.38*   The CrCl is unknown because both a height and weight (above a minimum accepted value) are required for this calculation. No results found for this basename: VANCOTROUGH, VANCOPEAK, VANCORANDOM, GENTTROUGH, GENTPEAK, GENTRANDOM, TOBRATROUGH, TOBRAPEAK, TOBRARND, AMIKACINPEAK, AMIKACINTROU, AMIKACIN,  in the last 72 hours   Microbiology: No results found for this or any previous visit (from the past 720 hour(s)).  Medical History: Past Medical History  Diagnosis Date  . Diabetes mellitus     Insulin dependent  . Chest pain, non-cardiac     History of 2 normal cardiac catheterizations  . Bradycardia   . Dizziness     Chronic  . Fibromyalgia   . Neuropathy   . Chronic chest pain   . Hyperlipidemia   . Chronic neck pain   . Chronic back pain   . Basal cell carcinoma of nose   . Depression   . Thyroid cyst   . Goiter   . Stroke   . Hypertension     Assessment: 73 yo female with concern for sepsis to start vancomycin and zosyn.  WBC= 14.3, SCr= 0.38. Zosyn 3.375gm and vancomycin 1gm IV have been ordered in the ED.  1/11 vanc 1/11 zosyn  1/11 blood x2  Goal of Therapy:  Vancomycin  trough level 15-20 mcg/ml  Plan:  -Zosyn 3.375gm IV q8h -Vancomycin 500mg  IV x1 (to complete 1500mg  load) followed by 1000mg  IV q24hr -Will follow renal function, cultures and clinical progress  Hildred Laser, Pharm D 08/30/2013 8:30 PM

## 2013-08-31 ENCOUNTER — Ambulatory Visit: Payer: Self-pay | Admitting: Neurology

## 2013-08-31 ENCOUNTER — Encounter (HOSPITAL_COMMUNITY): Payer: Self-pay | Admitting: *Deleted

## 2013-08-31 DIAGNOSIS — R509 Fever, unspecified: Secondary | ICD-10-CM

## 2013-08-31 DIAGNOSIS — E119 Type 2 diabetes mellitus without complications: Secondary | ICD-10-CM | POA: Diagnosis present

## 2013-08-31 DIAGNOSIS — A419 Sepsis, unspecified organism: Secondary | ICD-10-CM | POA: Diagnosis present

## 2013-08-31 DIAGNOSIS — M797 Fibromyalgia: Secondary | ICD-10-CM | POA: Diagnosis present

## 2013-08-31 DIAGNOSIS — G9340 Encephalopathy, unspecified: Secondary | ICD-10-CM | POA: Diagnosis present

## 2013-08-31 LAB — GLUCOSE, CAPILLARY
GLUCOSE-CAPILLARY: 243 mg/dL — AB (ref 70–99)
GLUCOSE-CAPILLARY: 202 mg/dL — AB (ref 70–99)
GLUCOSE-CAPILLARY: 317 mg/dL — AB (ref 70–99)
Glucose-Capillary: 450 mg/dL — ABNORMAL HIGH (ref 70–99)
Glucose-Capillary: 206 mg/dL — ABNORMAL HIGH (ref 70–99)

## 2013-08-31 LAB — BASIC METABOLIC PANEL
BUN: 9 mg/dL (ref 6–23)
CALCIUM: 7.9 mg/dL — AB (ref 8.4–10.5)
CO2: 26 mEq/L (ref 19–32)
Chloride: 101 mEq/L (ref 96–112)
Creatinine, Ser: 0.45 mg/dL — ABNORMAL LOW (ref 0.50–1.10)
GLUCOSE: 250 mg/dL — AB (ref 70–99)
Potassium: 3.4 mEq/L — ABNORMAL LOW (ref 3.7–5.3)
SODIUM: 139 meq/L (ref 137–147)

## 2013-08-31 LAB — URINALYSIS, ROUTINE W REFLEX MICROSCOPIC
Bilirubin Urine: NEGATIVE
Glucose, UA: >1000 mg/dL — AB
Hgb urine dipstick: NEGATIVE
KETONES UR: NEGATIVE mg/dL
Leukocytes, UA: NEGATIVE
NITRITE: NEGATIVE
PROTEIN: NEGATIVE mg/dL
Specific Gravity, Urine: 1.011 (ref 1.005–1.030)
Urobilinogen, UA: 0.2 mg/dL (ref 0.0–1.0)
pH: 5.5 (ref 5.0–8.0)

## 2013-08-31 LAB — CBC WITH DIFFERENTIAL/PLATELET
BASOS ABS: 0 10*3/uL (ref 0.0–0.1)
Basophils Relative: 0 % (ref 0–1)
EOS ABS: 0 10*3/uL (ref 0.0–0.7)
Eosinophils Relative: 0 % (ref 0–5)
HCT: 40.7 % (ref 36.0–46.0)
Hemoglobin: 13.7 g/dL (ref 12.0–15.0)
Lymphocytes Relative: 21 % (ref 12–46)
Lymphs Abs: 1.9 10*3/uL (ref 0.7–4.0)
MCH: 31.1 pg (ref 26.0–34.0)
MCHC: 33.7 g/dL (ref 30.0–36.0)
MCV: 92.3 fL (ref 78.0–100.0)
Monocytes Absolute: 0.8 10*3/uL (ref 0.1–1.0)
Monocytes Relative: 9 % (ref 3–12)
Neutro Abs: 6.5 10*3/uL (ref 1.7–7.7)
Neutrophils Relative %: 71 % (ref 43–77)
PLATELETS: 260 10*3/uL (ref 150–400)
RBC: 4.41 MIL/uL (ref 3.87–5.11)
RDW: 12.1 % (ref 11.5–15.5)
WBC: 9.2 10*3/uL (ref 4.0–10.5)

## 2013-08-31 LAB — HEMOGLOBIN A1C
Hgb A1c MFr Bld: 10 % — ABNORMAL HIGH (ref <5.7)
Mean Plasma Glucose: 240 mg/dL — ABNORMAL HIGH (ref <117)

## 2013-08-31 LAB — URINE MICROSCOPIC-ADD ON

## 2013-08-31 LAB — INFLUENZA PANEL BY PCR (TYPE A & B)
H1N1FLUPCR: NOT DETECTED
INFLBPCR: NEGATIVE
Influenza A By PCR: NEGATIVE

## 2013-08-31 LAB — LACTIC ACID, PLASMA: LACTIC ACID, VENOUS: 1.1 mmol/L (ref 0.5–2.2)

## 2013-08-31 LAB — TSH: TSH: 0.758 u[IU]/mL (ref 0.350–4.500)

## 2013-08-31 MED ORDER — INSULIN ASPART 100 UNIT/ML ~~LOC~~ SOLN
0.0000 [IU] | Freq: Three times a day (TID) | SUBCUTANEOUS | Status: DC
  Administered 2013-08-31: 15 [IU] via SUBCUTANEOUS
  Administered 2013-08-31: 18:00:00 5 [IU] via SUBCUTANEOUS
  Administered 2013-09-01: 11 [IU] via SUBCUTANEOUS

## 2013-08-31 MED ORDER — LISINOPRIL 10 MG PO TABS
10.0000 mg | ORAL_TABLET | Freq: Every day | ORAL | Status: DC
  Administered 2013-08-31 – 2013-09-01 (×2): 10 mg via ORAL
  Filled 2013-08-31 (×2): qty 1

## 2013-08-31 MED ORDER — ACETAMINOPHEN 325 MG PO TABS
650.0000 mg | ORAL_TABLET | Freq: Four times a day (QID) | ORAL | Status: DC | PRN
  Administered 2013-08-31 – 2013-09-01 (×4): 650 mg via ORAL
  Filled 2013-08-31 (×4): qty 2

## 2013-08-31 MED ORDER — ACETAMINOPHEN 650 MG RE SUPP: 650.0000 mg | Freq: Four times a day (QID) | RECTAL | Status: DC | PRN

## 2013-08-31 MED ORDER — ASPIRIN 325 MG PO TABS
325.0000 mg | ORAL_TABLET | Freq: Every day | ORAL | Status: DC
  Administered 2013-08-31 – 2013-09-01 (×2): 325 mg via ORAL
  Filled 2013-08-31 (×2): qty 1

## 2013-08-31 MED ORDER — GLUCOSAMINE-CHONDROITIN 500-400 MG PO TABS: 1.0000 | ORAL_TABLET | Freq: Two times a day (BID) | ORAL | Status: DC

## 2013-08-31 MED ORDER — INSULIN ASPART 100 UNIT/ML ~~LOC~~ SOLN
0.0000 [IU] | SUBCUTANEOUS | Status: DC
  Administered 2013-08-31 (×2): 5 [IU] via SUBCUTANEOUS

## 2013-08-31 MED ORDER — FA-PYRIDOXINE-CYANOCOBALAMIN 2.5-25-2 MG PO TABS
1.0000 | ORAL_TABLET | Freq: Every day | ORAL | Status: DC
  Administered 2013-09-01: 11:00:00 1 via ORAL
  Filled 2013-08-31 (×2): qty 1

## 2013-08-31 MED ORDER — INSULIN ASPART 100 UNIT/ML ~~LOC~~ SOLN
5.0000 [IU] | Freq: Once | SUBCUTANEOUS | Status: AC
  Administered 2013-08-31: 5 [IU] via SUBCUTANEOUS

## 2013-08-31 MED ORDER — POTASSIUM CHLORIDE CRYS ER 20 MEQ PO TBCR
40.0000 meq | EXTENDED_RELEASE_TABLET | Freq: Once | ORAL | Status: AC
  Administered 2013-08-31: 40 meq via ORAL
  Filled 2013-08-31: qty 2

## 2013-08-31 MED ORDER — NITROGLYCERIN 0.4 MG SL SUBL: 0.4000 mg | SUBLINGUAL_TABLET | SUBLINGUAL | Status: DC | PRN

## 2013-08-31 MED ORDER — ENOXAPARIN SODIUM 40 MG/0.4ML ~~LOC~~ SOLN
40.0000 mg | SUBCUTANEOUS | Status: DC
Start: 2013-08-31 — End: 2013-09-01
  Administered 2013-08-31: 40 mg via SUBCUTANEOUS
  Filled 2013-08-31 (×2): qty 0.4

## 2013-08-31 MED ORDER — POTASSIUM CHLORIDE CRYS ER 20 MEQ PO TBCR
20.0000 meq | EXTENDED_RELEASE_TABLET | Freq: Every day | ORAL | Status: DC
  Administered 2013-08-31 – 2013-09-01 (×2): 20 meq via ORAL
  Filled 2013-08-31 (×2): qty 1

## 2013-08-31 MED ORDER — OSELTAMIVIR PHOSPHATE 75 MG PO CAPS
75.0000 mg | ORAL_CAPSULE | Freq: Two times a day (BID) | ORAL | Status: DC
  Administered 2013-08-31 (×2): 75 mg via ORAL
  Filled 2013-08-31 (×3): qty 1

## 2013-08-31 MED ORDER — SODIUM CHLORIDE 0.9 % IJ SOLN
3.0000 mL | Freq: Two times a day (BID) | INTRAMUSCULAR | Status: DC
  Administered 2013-08-31: 3 mL via INTRAVENOUS

## 2013-08-31 MED ORDER — L-METHYLFOLATE-B12-B6-B2 6-1-50-5 MG PO TABS
1.0000 | ORAL_TABLET | Freq: Two times a day (BID) | ORAL | Status: DC
  Filled 2013-08-31 (×2): qty 1

## 2013-08-31 MED ORDER — SIMVASTATIN 40 MG PO TABS
40.0000 mg | ORAL_TABLET | Freq: Every day | ORAL | Status: DC
Start: 2013-08-31 — End: 2013-09-01
  Administered 2013-08-31: 40 mg via ORAL
  Filled 2013-08-31 (×2): qty 1

## 2013-08-31 MED ORDER — INSULIN GLARGINE 100 UNIT/ML ~~LOC~~ SOLN
15.0000 [IU] | Freq: Every day | SUBCUTANEOUS | Status: DC
  Administered 2013-08-31 – 2013-09-01 (×2): 15 [IU] via SUBCUTANEOUS
  Filled 2013-08-31 (×2): qty 0.15

## 2013-08-31 MED ORDER — FUROSEMIDE 40 MG PO TABS
40.0000 mg | ORAL_TABLET | ORAL | Status: DC
  Administered 2013-08-31: 40 mg via ORAL
  Filled 2013-08-31: qty 1

## 2013-08-31 MED ORDER — METFORMIN HCL 500 MG PO TABS
1000.0000 mg | ORAL_TABLET | Freq: Two times a day (BID) | ORAL | Status: DC
  Administered 2013-09-01: 09:00:00 1000 mg via ORAL
  Filled 2013-08-31 (×3): qty 2

## 2013-08-31 MED ORDER — SODIUM CHLORIDE 0.9 % IV SOLN
INTRAVENOUS | Status: DC
  Administered 2013-08-31: 02:00:00 via INTRAVENOUS

## 2013-08-31 NOTE — Progress Notes (Signed)
Addendum  Patient seen and examined, chart and data base reviewed.  I agree with the above assessment and plan.  For full details please see Mrs. Imogene Burn PA note.   Birdie Hopes, MD [facility] Pager: [phone] 08/31/2013, 5:33 PM

## 2013-08-31 NOTE — Progress Notes (Signed)
Patient's CBG 450. Dr. Hartford Poli aware orders received.

## 2013-08-31 NOTE — Evaluation (Signed)
Physical Therapy Evaluation Patient Details Name: Margaret Fischer MRN: [id number] DOB: 11-10-1940 Today's Date: 08/31/2013 Time: 5329-9242 PT Time Calculation (min): 30 min  PT Assessment / Plan / Recommendation History of Present Illness  Pt adm with AMS, N/V, fever, and diarrhea.  Clinical Impression  Pt admitted with above. Pt currently with functional limitations due to the deficits listed below (see PT Problem List). Pt much improved since admission. Pt will benefit from skilled PT to increase their independence and safety with mobility to allow discharge home.     PT Assessment  Patient needs continued PT services    Follow Up Recommendations  No PT follow up    Does the patient have the potential to tolerate intense rehabilitation      Barriers to Discharge        Equipment Recommendations  None recommended by PT    Recommendations for Other Services     Frequency Min 3X/week    Precautions / Restrictions Precautions Precautions: Fall Restrictions Weight Bearing Restrictions: No   Pertinent Vitals/Pain See flow sheet.      Mobility  Bed Mobility Overal bed mobility: Modified Independent Transfers Overall transfer level: Needs assistance Equipment used: None Transfers: Sit to/from Stand Sit to Stand: Min guard General transfer comment: Assist for safety Ambulation/Gait Ambulation/Gait assistance: Min guard Ambulation Distance (Feet): 100 Feet Assistive device: Rolling walker (2 wheeled);None Gait Pattern/deviations: Step-through pattern;Decreased stride length Gait velocity: decr Gait velocity interpretation: Below normal speed for age/gender General Gait Details: Pt slightly unsteady with gait in room and initially reaching for support.  Used walker in hallway and when returned to room didn't use walker and able to amb from sink to recliner without support.    Exercises     PT Diagnosis: Difficulty walking  PT Problem List: Decreased balance;Decreased  mobility PT Treatment Interventions: Gait training;Functional mobility training;Balance training;Patient/family education     PT Goals(Current goals can be found in the care plan section) Acute Rehab PT Goals Patient Stated Goal: Return home PT Goal Formulation: With patient Time For Goal Achievement: 09/03/13 Potential to Achieve Goals: Good  Visit Information  Last PT Received On: 08/31/13 Assistance Needed: +1 History of Present Illness: Pt adm with AMS, N/V, fever, and diarrhea.       Prior [place] expects to be discharged to:: Private residence Living Arrangements: Children Available Help at Discharge: Family Type of Home: House Home Access: Level entry [place]: Two level;Able to live on main level with bedroom/bathroom Home Equipment: None Prior Function Level of Independence: Independent Comments: Has been caring for sister at night. Communication Communication: No difficulties    Cognition  Cognition Arousal/Alertness: Awake/alert Behavior During Therapy: WFL for tasks assessed/performed Overall Cognitive Status: Within Functional Limits for tasks assessed    Extremity/Trunk Assessment Upper Extremity Assessment Upper Extremity Assessment: Overall WFL for tasks assessed Lower Extremity Assessment Lower Extremity Assessment: Overall WFL for tasks assessed   Balance Balance Overall balance assessment: Needs assistance Standing balance support: No upper extremity supported Standing balance-Leahy Scale: Fair  End of Session PT - End of Session Equipment Utilized During Treatment: Gait belt Activity Tolerance: Patient tolerated treatment well Patient left: in chair;with call bell/phone within reach;with chair alarm set Nurse Communication: Mobility status  GP     Michelle Vanhise 08/31/2013, 10:55 AM  [facility] PT [phone]

## 2013-08-31 NOTE — Progress Notes (Signed)
[facility] PROGRESS NOTE  Margaret Fischer HMC:[id number] DOB: 1941-04-08 DOA: 08/30/2013 PCP: Delia Chimes, NP  HPI/Subjective: Pt is a 73 yr old with a PMH of HTN, Type 2 diabetes mellitus, hyperlipidemia, atypical chest pain and neuropathy.  Pt presented to the ED morning of 08/31/13 with mental status change for 1 day.  Also presented with N/V.  Pt was started on IV Zosyn, Vancocin and Tamiflu for suspected sepsis and influenza.  Pt was sleepy but was able to answer questions on the morning of 08/31/13 on inspection.  Knew date, location and president on MSE, has no recollection of what brought her into the hospital.   Denied previous cough and chest pain.  Ran a fever of 100.4 the morning of 08/31/13.  Will continue IV Abx and monitor status.   Assessment/Plan: Sepsis -Appears resolved. -Pt presented with N/V, mental status change, had a fever of 100.4 morning of 08/31/13, white count of 14,300 at presentation on 08/30/2013.  Likely due to viral cause with blood cultures pending. -Started Pt on IV Zosyn and Vancocin as well as Tamiflu.  Which was discontinued this am. Pt's White count trended down to 9200 on 08/31/2013  Acute Encephalopathy - Likely secondary to sepsis.  Was experiencing mental status change for up to 1 day before presenting to the ED.  Head CT negative.   -Put on IV Abx to treat sepsis and also placed on IV fluids for dehydration.  Normal MSE performed on 08/31/2013.  Pt knew date, location and President.  Pt still unclear as to the reason for being in the hospital -Continue pt on IV abx and monitor Mental status on 08/31/13  Hypertension -Pt has a PMH of hypertension and most recent BP reading 129/59 -Continue at-home hypertensive medications  Hyperglycemia -Pt has a PMH of DM type II and had a fasting glucose reading of 243 on 08/30/2013 -Began IV insulin to treat hyperglycemia, lab values are trending down to a 202 FS glucose reading on 08/31/13 -Continue IV insulin and  continue monitoring for signs of hyperglycemia -Hbg A1c is pending  Influenza - Influenza PCR for A and B came back negative -Can d/c Tamiflu   DVT Prophylaxis:  Lovenox 40mg  SQ q24hrs  Code Status: Full Code Family Communication: Spoke only with patient during examination.  Patient is alert and orientated. Disposition Plan: Will continue to monitor symptoms and fever on 08/31/2013 with discharge to home likely 1/13   Consultants:  None  Procedures:  None  Antibiotics:  IV Zosyn  IV Vancocin  Objective: Filed Vitals:   08/31/13 0211 08/31/13 0433 08/31/13 0935 08/31/13 1122  BP: 132/68 136/55  129/56  Pulse: 89 83    Temp: 99.8 F (37.7 C) 97.7 F (36.5 C)    TempSrc: Oral Oral    Resp: 18 18    Height: 5\' 8"  (1.727 m)     Weight: 86.9 kg (191 lb 9.3 oz)     SpO2: 92%  96%     Intake/Output Summary (Last 24 hours) at 08/31/13 1423 Last data filed at 08/31/13 1319  Gross per 24 hour  Intake   1100 ml  Output   1351 ml  Net   -251 ml   Filed Weights   08/31/13 0211  Weight: 86.9 kg (191 lb 9.3 oz)    Exam: General: Well developed, well nourished, NAD, appears stated age  75:  PERRLA, EOMI, Anicteic Sclera, MMM. No pharyngeal erythema or exudates  Neck: Supple, no JVD, no masses  Cardiovascular: RRR,  S1 S2 auscultated, no rubs, murmurs or gallops.   Respiratory: Clear to auscultation bilaterally with equal chest rise  Abdomen: Soft, nontender, nondistended, + bowel sounds  Extremities: warm dry without cyanosis, clubbing or edema Neuro: Sensation intake in the hands and feet.  5/5 grip strength in hands. MSE normal Skin: Redness present on hands with petechia present on extremities, trunk and face. Psych: Normal affect and demeanor with intact judgement and insight.     Data Reviewed: Basic Metabolic Panel:  Recent Labs Lab 08/30/13 1828 08/31/13 0314  NA 136* 139  K 3.9 3.4*  CL 93* 101  CO2 25 26  GLUCOSE 254* 250*  BUN 12 9   CREATININE 0.38* 0.45*  CALCIUM 8.8 7.9*   Liver Function Tests:  Recent Labs Lab 08/30/13 1828  AST 22  ALT 14  ALKPHOS 118*  BILITOT 0.7  PROT 7.2  ALBUMIN 4.2  CBC:  Recent Labs Lab 08/30/13 1828 08/31/13 0314  WBC 14.3* 9.2  NEUTROABS  --  6.5  HGB 16.5* 13.7  HCT 48.0* 40.7  MCV 93.0 92.3  PLT 327 260   CBG:  Recent Labs Lab 08/30/13 1715 08/31/13 0304 08/31/13 0744 08/31/13 1316  GLUCAP 243* 243* 202* 450*      Studies: Ct Head Wo Contrast  08/30/2013   CLINICAL DATA:  Altered mental status  EXAM: CT HEAD WITHOUT CONTRAST  TECHNIQUE: Contiguous axial images were obtained from the base of the skull through the vertex without intravenous contrast.  COMPARISON:  09/04/2011  FINDINGS: The bony calvarium is intact. The ventricles are of normal size and configuration. No findings to suggest acute hemorrhage, acute infarction or space-occupying mass lesion are noted.  IMPRESSION: No acute abnormality noted.   Electronically Signed   By: Inez Catalina M.D.   On: 08/30/2013 20:35   Dg Chest Port 1 View  08/30/2013   CLINICAL DATA:  Shortness of breath  EXAM: PORTABLE CHEST - 1 VIEW  COMPARISON:  12/12/2010  FINDINGS: Right lateral decubitus imaging performed due to patient's inability to be positioned upright. There is exclusion of the peripheral right chest as a result. Differential prominence of vascular markings and lung aeration is likely positional. Borderline cardiomegaly, size and morphology grossly stable from prior. No visible effusion or pneumothorax.  IMPRESSION: When accounting for decubitus positioning, no overt edema or consolidation.   Electronically Signed   By: Jorje Guild M.D.   On: 08/30/2013 21:16    Scheduled Meds: . aspirin  325 mg Oral Daily  . enoxaparin (LOVENOX) injection  40 mg Subcutaneous Q24H  . folic acid-pyridoxine-cyancobalamin  1 tablet Oral Daily  . furosemide  40 mg Oral QODAY  . insulin aspart  0-15 Units Subcutaneous TID WC   . insulin glargine  15 Units Subcutaneous Daily  . lisinopril  10 mg Oral Daily  . potassium chloride SA  20 mEq Oral Daily  . simvastatin  40 mg Oral q1800  . sodium chloride  3 mL Intravenous Q12H   Continuous Infusions:    Principal Problem:   Sepsis Active Problems:   HTN (hypertension)   Altered mental state   Diabetes mellitus   Fibromyalgia   Acute encephalopathy    Cameron Proud PA-S  Imogene Burn, PA-C [facility] Pager [phone]. If 7PM-7AM, please contact night-coverage at [email or web address], password [facility] 08/31/2013, 2:23 PM  LOS: 1 day

## 2013-08-31 NOTE — Progress Notes (Signed)
PHARMACIST - PHYSICIAN ORDER COMMUNICATION  CONCERNING: P&T Medication Policy on Herbal Medications  DESCRIPTION:  This patient's order for:  Glucosamine-Chondroitin  has been noted.  This product(s) is classified as an "herbal" or natural product. Due to a lack of definitive safety studies or FDA approval, nonstandard manufacturing practices, plus the potential risk of unknown drug-drug interactions while on inpatient medications, the Pharmacy and Therapeutics Committee does not permit the use of "herbal" or natural products of this type within [place].   ACTION TAKEN: The pharmacy department is unable to verify this order at this time and your patient has been informed of this safety policy. Please reevaluate patient's clinical condition at discharge and address if the herbal or natural product(s) should be resumed at that time.   

## 2013-08-31 NOTE — H&P (Signed)
[facility] History and Physical  Dalari Neumann Samad [id number] DOB: [birth year] DOA: 08/30/2013  Referring physician: ED PCP: Delia Chimes, NP   Chief Complaint:  Altered mental status since one day  History obtained from the ED physician and patient's son Darren over the phone.  HPI:  73 year old female with history of hypertension, fibromyalgia, atypical chest pain, type 2 diabetes mellitus, hyperlipidemia who was brought to the ED with change in her mental status since one day. As per the history provided by her family, patient at baseline he is well oriented and capable of her ADLs and even cares for her ailing sister.  Since yesterday she has been c/o headache and weakness. As per her son, She took some narcotic this morning. Later during the day and she appeared more confused had an episode of vomiting and watery diarrhea. Patient was brought to the ED and was very confused and combative. She was found to be febrile to 102.2 Fahrenheit. She was tachycardic to 111 with RR of  27. Blood work done showed leukocytosis with WBC of 14.3 k, glucose of 254. A chest x-ray done was unremarkable. UA was unremarkable for UTI ( showed some bacteria but without nitrites or leukocyte esterase).  a Foley was placed in the ED. Head CT done and was unremarkable.  During my evaluation patient was sleepy but able to open her eyes and answer questions. She is oriented to place and person. However she does not remember why she was brought to the hospital. On questioning she reports having some headache since yesterday and cough. She also reports some right sided chest discomfort of unknown duration. denies any nasal congestion, photophobia, blurred vision, dizziness, nausea, vomiting, chest pain, palpitations, shortness of breath, generalized body aches, abdominal pain, dysuria or diarrhea. She is quite restless during conversation. She denies any sick contact. However as per son she has been taking care of  her ailing sister who was recently in the hospital. Patient denies  Any change in her medications.     Review of Systems:  As outlined in history of present illness. History Limited due to patient's confusion.    Past Medical History  Diagnosis Date  . Diabetes mellitus     Insulin dependent  . Chest pain, non-cardiac     History of 2 normal cardiac catheterizations  . Bradycardia   . Dizziness     Chronic  . Fibromyalgia   . Neuropathy   . Chronic chest pain   . Hyperlipidemia   . Chronic neck pain   . Chronic back pain   . Basal cell carcinoma of nose   . Depression   . Thyroid cyst   . Goiter   . Stroke   . Hypertension    Past Surgical History  Procedure Laterality Date  . Total knee arthroplasty    . Cardiac catheterization      History of 2 caths, reportedly normal  . Joint replacement    . Rotator cuff repair    . Appendectomy    . Cystectomy    . Thyroid surgery    . Breast lumpectomy    . Abdominal hysterectomy    . Biceps tendon repair    . Refractive surgery    . Right nares      basal cell surgery   Social History:  reports that she quit smoking about 29 years ago. She has never used smokeless tobacco. She reports that she does not drink alcohol or use illicit drugs.  Allergies  Allergen Reactions  . Epinephrine     Tachycardia when used as a local anesthetic  . Hydromorphone     UNKNOWN  . Morphine And Related     UNKNOWN  . Phenergan [Promethazine Hcl]     " GOES WACK YOU WITH THIS MEDICATION" STATEMENT MADE BY PATIENT  . Succinylcholine   . Latex Rash    Family History  Problem Relation Age of Onset  . Lymphoma Mother   . Diabetic kidney disease Mother   . Diabetic kidney disease Brother   . Heart disease Sister   . Cancer Sister   . Heart attack Brother   . Myasthenia gravis Brother     Prior to Admission medications   Medication Sig Start Date End Date Taking? Authorizing Provider  amitriptyline (ELAVIL) 10 MG tablet Take 10  mg by mouth at bedtime as needed for sleep.   Yes Historical Provider, MD  aspirin 325 MG tablet Take 325 mg by mouth 2 (two) times daily.    Yes Historical Provider, MD  Cholecalciferol (VITAMIN D-3 PO) Take by mouth daily.     Yes Historical Provider, MD  clonazePAM (KLONOPIN) 2 MG tablet Take 2 mg by mouth 3 (three) times daily as needed.     Yes Historical Provider, MD  FLUoxetine (PROZAC) 40 MG capsule Take 80 mg by mouth daily.    Yes Historical Provider, MD  furosemide (LASIX) 40 MG tablet Take 40 mg by mouth every other day.    Yes Historical Provider, MD  gabapentin (NEURONTIN) 300 MG capsule Take 600 mg by mouth 3 (three) times daily.   Yes Historical Provider, MD  amLODipine (NORVASC) 5 MG tablet Take 1 tablet (5 mg total) by mouth daily. 01/19/11 01/19/12  Burtis Junes, NP  glucosamine-chondroitin 500-400 MG tablet Take 1 tablet by mouth 2 (two) times daily.      Historical Provider, MD  insulin glargine (LANTUS) 100 UNIT/ML injection Inject 30 Units into the skin at bedtime.     Historical Provider, MD  l-methylfolate-b2-b6-b12 (CEREFOLIN) 01-18-49-5 MG TABS Take 1 tablet by mouth 2 (two) times daily.      Historical Provider, MD  lisinopril (PRINIVIL,ZESTRIL) 10 MG tablet Take 1 tablet (10 mg total) by mouth daily. 01/02/13   Thayer Headings, MD  metFORMIN (GLUCOPHAGE) 500 MG tablet Take 500 mg by mouth 2 (two) times daily with a meal.      Historical Provider, MD  nitroGLYCERIN (NITROSTAT) 0.4 MG SL tablet Place 0.4 mg under the tongue every 5 (five) minutes as needed.      Historical Provider, MD  oxyCODONE-acetaminophen (PERCOCET) 10-325 MG per tablet Take 1 tablet by mouth every 4 (four) hours as needed.    Historical Provider, MD  oxyCODONE-acetaminophen (PERCOCET) 5-325 MG per tablet Take 1 tablet by mouth every 4 (four) hours as needed.     Historical Provider, MD  potassium chloride SA (K-DUR,KLOR-CON) 20 MEQ tablet Take 20 mEq by mouth daily.      Historical Provider, MD   pravastatin (PRAVACHOL) 40 MG tablet Take 80 mg by mouth at bedtime.     Historical Provider, MD    Physical Exam:  Filed Vitals:   08/30/13 1954 08/30/13 2015 08/30/13 2128 08/30/13 2200  BP: 185/63 172/63 168/66 159/57  Pulse: 111     Temp: 102.2 F (39 C)     TempSrc: Rectal     Resp:  16 25 27   SpO2: 93%       Constitutional: Vital signs  reviewed.  Elderly female lying in bed sleepy but arousable to command. HEENT: Pupils reactive bilaterally, no pallor, moist oral mucosa, no cervical lymphadenopathy, nor in the rigidity Chest: Clear to auscultation bilaterally, no added sounds CVS: Normal S1 and S2, no murmurs rub or gallop Abdomen: Soft, nontender, nondistended, bowel sounds present. Foley placed in  ED Extremities: Warm, no edema CNS: Sleepy but awake to commands, oriented to place and person, no focal weakness, no neck rigidity   Labs on Admission:  Basic Metabolic Panel:  Recent Labs Lab 08/30/13 1828  NA 136*  K 3.9  CL 93*  CO2 25  GLUCOSE 254*  BUN 12  CREATININE 0.38*  CALCIUM 8.8   Liver Function Tests:  Recent Labs Lab 08/30/13 1828  AST 22  ALT 14  ALKPHOS 118*  BILITOT 0.7  PROT 7.2  ALBUMIN 4.2   No results found for this basename: LIPASE, AMYLASE,  in the last 168 hours No results found for this basename: AMMONIA,  in the last 168 hours CBC:  Recent Labs Lab 08/30/13 1828  WBC 14.3*  HGB 16.5*  HCT 48.0*  MCV 93.0  PLT 327   Cardiac Enzymes: No results found for this basename: CKTOTAL, CKMB, CKMBINDEX, TROPONINI,  in the last 168 hours BNP: No components found with this basename: POCBNP,  CBG:  Recent Labs Lab 08/30/13 1715  GLUCAP 243*    Radiological Exams on Admission: Ct Head Wo Contrast  08/30/2013   CLINICAL DATA:  Altered mental status  EXAM: CT HEAD WITHOUT CONTRAST  TECHNIQUE: Contiguous axial images were obtained from the base of the skull through the vertex without intravenous contrast.  COMPARISON:   09/04/2011  FINDINGS: The bony calvarium is intact. The ventricles are of normal size and configuration. No findings to suggest acute hemorrhage, acute infarction or space-occupying mass lesion are noted.  IMPRESSION: No acute abnormality noted.   Electronically Signed   By: Inez Catalina M.D.   On: 08/30/2013 20:35   Dg Chest Port 1 View  08/30/2013   CLINICAL DATA:  Shortness of breath  EXAM: PORTABLE CHEST - 1 VIEW  COMPARISON:  12/12/2010  FINDINGS: Right lateral decubitus imaging performed due to patient's inability to be positioned upright. There is exclusion of the peripheral right chest as a result. Differential prominence of vascular markings and lung aeration is likely positional. Borderline cardiomegaly, size and morphology grossly stable from prior. No visible effusion or pneumothorax.  IMPRESSION: When accounting for decubitus positioning, no overt edema or consolidation.   Electronically Signed   By: Jorje Guild M.D.   On: 08/30/2013 21:16    EKG: Normal sinus rhythm at 96, prolonged PR, non specific ST changes in lateral leads  Assessment/Plan   Principal Problem:   Sepsis No clear etiology. The patient to telemetry Blood cultures and urine cultures sent from the ED which we will follow. No clinical signs of meningitis. As patient is quite restless, LP was planned but not performed by EDP. -Continue neuro checks. Patient given empiric IV vancomycin and Zosyn in the ED which I would continue. -Follow fluPCR. I would place her on empiric Tamiflu. Check stool for c diff. -Check lactic acid and am cortisol level -Check TSH -If patient continues to have fever with encephalopathy, she should get LP under fluoroscopy. -Gentle IV fluids. Tylenol when necessary for fever  Active Problems:   Acute encephalopathy Secondary to sepsis. Continue IV hydration. Continue neuro checks. -Patient is on multiple medications including benzos, narcotic, neurontin , amitryptalin  and prozac. i will  hold them all for now.    HTN (hypertension) Continue home blood pressure medications    Diabetes mellitus Currently n.p.o. Place on sliding scale insulin      Diet: N.p.o.  DVT prophylaxis: Subcutaneous Lovenox  Code Status: Full code Family Communication: Son updated over the phone Disposition Plan: home once improved  Louellen Molder [facility] Pager [phone]  If 7PM-7AM, please contact night-coverage [email or web address] Password [facility] 08/31/2013, 12:19 AM   Total time spent: 70 minutes

## 2013-08-31 NOTE — Progress Notes (Signed)
Report from ED nurse interrupted by ED emergency.  ED nurse to call back to finish report.

## 2013-08-31 NOTE — Progress Notes (Signed)
Margaret Fischer [id number] Admitted to 5W20: 08/31/2013 3:27 AM Attending Provider: Louellen Molder, MD    Margaret Fischer is a 73 y.o. female patient admitted from ED awake, alert  & orientated  X 3,  Full Code, VSS - Blood pressure 132/68, pulse 89, temperature 99.8 F (37.7 C), temperature source Oral, resp. rate 18, height 5\' 8"  (1.727 m), weight 86.9 kg (191 lb 9.3 oz), SpO2 92.00%.RA, no c/o shortness of breath, no c/o chest pain, no distress noted. Tele #[id number] placed and pt is currently running: NSR   IV site WDL: with a transparent dsg that's clean dry and intact.  Allergies:   Allergies  Allergen Reactions  . Epinephrine     Tachycardia when used as a local anesthetic  . Hydromorphone     UNKNOWN  . Morphine And Related     UNKNOWN  . Phenergan [Promethazine Hcl]     " GOES WACK YOU WITH THIS MEDICATION" STATEMENT MADE BY PATIENT  . Succinylcholine   . Latex Rash     Past Medical History  Diagnosis Date  . Diabetes mellitus     Insulin dependent  . Chest pain, non-cardiac     History of 2 normal cardiac catheterizations  . Bradycardia   . Dizziness     Chronic  . Fibromyalgia   . Neuropathy   . Chronic chest pain   . Hyperlipidemia   . Chronic neck pain   . Chronic back pain   . Basal cell carcinoma of nose   . Depression   . Thyroid cyst   . Goiter   . Stroke   . Hypertension     History:   Pt orientation to unit, room and routine. Information packet given to patient and safety video needs to be viewed.  Admission INP armband ID verified with patient, and in place. SR up x 2, fall risk assessment complete with Patient and family verbalizing understanding of risks associated with falls. Pt shown how to use the call bell and to call for help before getting out of bed.  Skin, clean-dry- intact without evidence of bruising, or skin tears, however, she does have some scattered scratches and redness that's blanchable on her face and hands.    Will cont to monitor and  assist as needed.  [name], RN 08/31/2013 3:27 AM

## 2013-09-01 DIAGNOSIS — G9340 Encephalopathy, unspecified: Secondary | ICD-10-CM

## 2013-09-01 DIAGNOSIS — E119 Type 2 diabetes mellitus without complications: Secondary | ICD-10-CM

## 2013-09-01 DIAGNOSIS — IMO0001 Reserved for inherently not codable concepts without codable children: Secondary | ICD-10-CM

## 2013-09-01 DIAGNOSIS — R4182 Altered mental status, unspecified: Secondary | ICD-10-CM

## 2013-09-01 DIAGNOSIS — I10 Essential (primary) hypertension: Secondary | ICD-10-CM

## 2013-09-01 DIAGNOSIS — A419 Sepsis, unspecified organism: Secondary | ICD-10-CM

## 2013-09-01 LAB — BASIC METABOLIC PANEL
BUN: 14 mg/dL (ref 6–23)
CO2: 25 mEq/L (ref 19–32)
Calcium: 8 mg/dL — ABNORMAL LOW (ref 8.4–10.5)
Chloride: 98 mEq/L (ref 96–112)
Creatinine, Ser: 0.44 mg/dL — ABNORMAL LOW (ref 0.50–1.10)
GFR calc Af Amer: >90 mL/min (ref >90)
GLUCOSE: 276 mg/dL — AB (ref 70–99)
POTASSIUM: 3.7 meq/L (ref 3.7–5.3)
SODIUM: 137 meq/L (ref 137–147)

## 2013-09-01 LAB — GLUCOSE, CAPILLARY
GLUCOSE-CAPILLARY: 302 mg/dL — AB (ref 70–99)
GLUCOSE-CAPILLARY: 203 mg/dL — AB (ref 70–99)

## 2013-09-01 LAB — CORTISOL-AM, BLOOD: CORTISOL - AM: 12.8 ug/dL (ref 4.3–22.4)

## 2013-09-01 MED ORDER — CLONAZEPAM 2 MG PO TABS
2.0000 mg | ORAL_TABLET | Freq: Three times a day (TID) | ORAL | Status: DC | PRN
Start: 2013-09-01 — End: 2013-12-05

## 2013-09-01 MED ORDER — PRAVASTATIN SODIUM 40 MG PO TABS: 40.0000 mg | ORAL_TABLET | Freq: Every day | ORAL | Status: AC

## 2013-09-01 MED ORDER — FLUCONAZOLE 100 MG PO TABS: 100.0000 mg | ORAL_TABLET | Freq: Every day | ORAL | Status: DC

## 2013-09-01 MED ORDER — INSULIN DETEMIR 100 UNIT/ML ~~LOC~~ SOLN: 30.0000 [IU] | Freq: Two times a day (BID) | SUBCUTANEOUS | Status: DC

## 2013-09-01 MED ORDER — ONDANSETRON HCL 4 MG/2ML IJ SOLN
4.0000 mg | Freq: Four times a day (QID) | INTRAMUSCULAR | Status: DC | PRN
  Administered 2013-09-01: 4 mg via INTRAVENOUS
  Filled 2013-09-01: qty 2

## 2013-09-01 NOTE — Care Management Note (Signed)
    Page 1 of 1   09/01/2013     3:41:19 PM   CARE MANAGEMENT NOTE 09/01/2013  Patient:  Margaret Fischer, Margaret Fischer   Account Number:  [id number]  Date Initiated:  09/01/2013  Documentation initiated by:  Tomi Bamberger  Subjective/Objective Assessment:   dx sepsis  admit- lives with daughter.     Action/Plan:   pt eval- no pt f/u needed.   Anticipated DC Date:  09/01/2013   Anticipated DC Plan:  [place]  CM consult      Choice offered to / List presented to:             Status of service:  Completed, signed off Medicare Important Message given?   (If response is "NO", the following Medicare IM given date fields will be blank) Date Medicare IM given:   Date Additional Medicare IM given:    Discharge Disposition:  HOME/SELF CARE  Per UR Regulation:  Reviewed for med. necessity/level of care/duration of stay  If discussed at [place] of Stay Meetings, dates discussed:    Comments:  09/01/13 15:38 Tomi Bamberger RN, BSN [phone] patient lives with with daughter.  Per physical therapy , patient has no pt f/u needs.  Patient also states she has Midwife for medications and [employer] supplement. Patient for dc today.

## 2013-09-01 NOTE — Progress Notes (Signed)
Nsg Discharge Note  Admit Date:  08/30/2013 Discharge date: 09/01/2013   Margaret Fischer to be D/C'd Home per MD order.  AVS completed.  Copy for chart, and copy for patient signed, and dated. Patient/caregiver able to verbalize understanding.  Discharge Medication:   Medication List    STOP taking these medications       nitroGLYCERIN 0.4 MG SL tablet  Commonly known as:  NITROSTAT      TAKE these medications       amitriptyline 10 MG tablet  Commonly known as:  ELAVIL  Take 10 mg by mouth at bedtime as needed for sleep.     amLODipine 5 MG tablet  Commonly known as:  NORVASC  Take 1 tablet (5 mg total) by mouth daily.     aspirin 325 MG tablet  Take 325 mg by mouth 2 (two) times daily.     B-12 PO  Take 1 tablet by mouth daily.     clonazePAM 2 MG tablet  Commonly known as:  KLONOPIN  Take 1 tablet (2 mg total) by mouth 3 (three) times daily as needed.     fluconazole 100 MG tablet  Commonly known as:  DIFLUCAN  Take 1 tablet (100 mg total) by mouth daily.     FLUoxetine 40 MG capsule  Commonly known as:  PROZAC  Take 80 mg by mouth daily.     furosemide 40 MG tablet  Commonly known as:  LASIX  Take 40 mg by mouth every other day.     gabapentin 300 MG capsule  Commonly known as:  NEURONTIN  Take 600 mg by mouth 3 (three) times daily.     glucosamine-chondroitin 500-400 MG tablet  Take 1 tablet by mouth 2 (two) times daily.     insulin detemir 100 UNIT/ML injection  Commonly known as:  LEVEMIR  Inject 0.3 mLs (30 Units total) into the skin 2 (two) times daily.     metFORMIN 1000 MG tablet  Commonly known as:  GLUCOPHAGE  Take 1,000 mg by mouth 2 (two) times daily with a meal.     pravastatin 40 MG tablet  Commonly known as:  PRAVACHOL  Take 1 tablet (40 mg total) by mouth daily.     VITAMIN D-3 PO  Take by mouth daily.        Discharge Assessment: Filed Vitals:   09/01/13 1000  BP: 151/67  Pulse: 80  Temp:   Resp:    Skin clean, dry and  intact without evidence of skin break down, no evidence of skin tears noted. IV catheter discontinued intact. Site without signs and symptoms of complications - no redness or edema noted at insertion site, patient denies c/o pain - only slight tenderness at site.  Dressing with slight pressure applied.  D/c Instructions-Education: Discharge instructions given to patient/family with verbalized understanding. D/c education completed with patient/family including follow up instructions, medication list, d/c activities limitations if indicated, with other d/c instructions as indicated by MD - patient able to verbalize understanding, all questions fully answered. Patient instructed to return to ED, call 911, or call MD for any changes in condition.  Patient escorted via [place], and D/C home via private auto.  Dayle Points, RN 09/01/2013 2:40 PM

## 2013-09-01 NOTE — Discharge Summary (Addendum)
Addendum  Patient seen and examined, chart and data base reviewed.  I agree with the above assessment and plan.  For full details please see Mrs. Imogene Burn PA note.  SIRS not sepsis, no evidence of infection.   Birdie Hopes, MD [facility] Pager: [phone] 09/01/2013, 6:10 PM

## 2013-09-01 NOTE — ED Provider Notes (Signed)
I saw and evaluated the patient, reviewed the resident's note and I agree with the findings and plan.  EKG Interpretation    Date/Time:  Sunday August 30 2013 17:00:23 EST Ventricular Rate:  96 PR Interval:  216 QRS Duration: 75 QT Interval:  356 QTC Calculation: 450 R Axis:   47 Text Interpretation:  Age not entered, assumed to be  73 years old for purpose of ECG interpretation Sinus rhythm Prolonged PR interval Minimal ST depression, lateral leads Baseline wander in lead(s) I ED PHYSICIAN INTERPRETATION AVAILABLE IN [facility] [place] Confirmed by TEST, RECORD ([id number]) on 09/01/2013 9:32:07 AM            Pt presented w family with altered mental status. Pt v unclear/difficult historian given acute mental status changes - level 5 caveat.  While in ED noted w fevers. Septic workup initiated. Iv ns bolus. o2 [place]. Continuous pulse and continuous cardiac monitor.   Stat labs and ct ordered. On multiple rechecks pt mental status c/w prior, confusion/disoriented.  Resident have given behavioral meds in attempt to calm/sedate for CT - ?mild improvement in agitations, remains altered/confused.  Results for orders placed during the hospital encounter of 08/30/13  CULTURE, BLOOD (ROUTINE X 2)      Result Value Range   Specimen Description BLOOD RIGHT WRIST     Special Requests BOTTLES DRAWN AEROBIC AND ANAEROBIC 10CC EA     Culture  Setup Time       Value: 08/31/2013 00:04     Performed at [facility]   Culture       Value:        BLOOD CULTURE RECEIVED NO GROWTH TO DATE CULTURE WILL BE HELD FOR 5 DAYS BEFORE ISSUING A FINAL NEGATIVE REPORT     Performed at [facility]   Report Status PENDING    CULTURE, BLOOD (ROUTINE X 2)      Result Value Range   Specimen Description BLOOD RIGHT FOREARM     Special Requests BOTTLES DRAWN AEROBIC ONLY 10CC     Culture  Setup Time       Value: 08/31/2013 00:04     Performed at [facility]   Culture       Value:         BLOOD CULTURE RECEIVED NO GROWTH TO DATE CULTURE WILL BE HELD FOR 5 DAYS BEFORE ISSUING A FINAL NEGATIVE REPORT     Performed at [facility]   Report Status PENDING    CBC      Result Value Range   WBC 14.3 (*) 4.0 - 10.5 K/uL   RBC 5.16 (*) 3.87 - 5.11 MIL/uL   Hemoglobin 16.5 (*) 12.0 - 15.0 g/dL   HCT 48.0 (*) 36.0 - 46.0 %   MCV 93.0  78.0 - 100.0 fL   MCH 32.0  26.0 - 34.0 pg   MCHC 34.4  30.0 - 36.0 g/dL   RDW 12.1  11.5 - 15.5 %   Platelets 327  150 - 400 K/uL  COMPREHENSIVE METABOLIC PANEL      Result Value Range   Sodium 136 (*) 137 - 147 mEq/L   Potassium 3.9  3.7 - 5.3 mEq/L   Chloride 93 (*) 96 - 112 mEq/L   CO2 25  19 - 32 mEq/L   Glucose, Bld 254 (*) 70 - 99 mg/dL   BUN 12  6 - 23 mg/dL   Creatinine, Ser 0.38 (*) 0.50 - 1.10 mg/dL   Calcium 8.8  8.4 - 10.5 mg/dL   Total Protein 7.2  6.0 - 8.3 g/dL   Albumin 4.2  3.5 - 5.2 g/dL   AST 22  0 - 37 U/L   ALT 14  0 - 35 U/L   Alkaline Phosphatase 118 (*) 39 - 117 U/L   Total Bilirubin 0.7  0.3 - 1.2 mg/dL   GFR calc non Af Amer >90  >90 mL/min   GFR calc Af Amer >90  >90 mL/min  URINALYSIS, ROUTINE W REFLEX MICROSCOPIC      Result Value Range   Color, Urine YELLOW  YELLOW   APPearance CLOUDY (*) CLEAR   Specific Gravity, Urine 1.026  1.005 - 1.030   pH 8.5 (*) 5.0 - 8.0   Glucose, UA >1000 (*) NEGATIVE mg/dL   Hgb urine dipstick NEGATIVE  NEGATIVE   Bilirubin Urine NEGATIVE  NEGATIVE   Ketones, ur >80 (*) NEGATIVE mg/dL   Protein, ur 30 (*) NEGATIVE mg/dL   Urobilinogen, UA 1.0  0.0 - 1.0 mg/dL   Nitrite NEGATIVE  NEGATIVE   Leukocytes, UA NEGATIVE  NEGATIVE  GLUCOSE, CAPILLARY      Result Value Range   Glucose-Capillary 243 (*) 70 - 99 mg/dL  URINE RAPID DRUG SCREEN (HOSP PERFORMED)      Result Value Range   Opiates NONE DETECTED  NONE DETECTED   Cocaine NONE DETECTED  NONE DETECTED   Benzodiazepines NONE DETECTED  NONE DETECTED   Amphetamines NONE DETECTED  NONE DETECTED   Tetrahydrocannabinol  NONE DETECTED  NONE DETECTED   Barbiturates NONE DETECTED  NONE DETECTED  URINE MICROSCOPIC-ADD ON      Result Value Range   Squamous Epithelial / LPF FEW (*) RARE   WBC, UA 0-2  <3 WBC/hpf   Bacteria, UA FEW (*) RARE   Urine-Other AMORPHOUS URATES/PHOSPHATES    INFLUENZA PANEL BY PCR (TYPE A & B, H1N1)      Result Value Range   Influenza A By PCR NEGATIVE  NEGATIVE   Influenza B By PCR NEGATIVE  NEGATIVE   H1N1 flu by pcr NOT DETECTED  NOT DETECTED  URINALYSIS, ROUTINE W REFLEX MICROSCOPIC      Result Value Range   Color, Urine YELLOW  YELLOW   APPearance CLEAR  CLEAR   Specific Gravity, Urine 1.031 (*) 1.005 - 1.030   pH 7.0  5.0 - 8.0   Glucose, UA >1000 (*) NEGATIVE mg/dL   Hgb urine dipstick NEGATIVE  NEGATIVE   Bilirubin Urine NEGATIVE  NEGATIVE   Ketones, ur >80 (*) NEGATIVE mg/dL   Protein, ur 30 (*) NEGATIVE mg/dL   Urobilinogen, UA 1.0  0.0 - 1.0 mg/dL   Nitrite NEGATIVE  NEGATIVE   Leukocytes, UA NEGATIVE  NEGATIVE  URINE MICROSCOPIC-ADD ON      Result Value Range   WBC, UA 0-2  <3 WBC/hpf   RBC / HPF 0-2  <3 RBC/hpf  URINALYSIS, ROUTINE W REFLEX MICROSCOPIC      Result Value Range   Color, Urine YELLOW  YELLOW   APPearance CLEAR  CLEAR   Specific Gravity, Urine 1.011  1.005 - 1.030   pH 5.5  5.0 - 8.0   Glucose, UA >1000 (*) NEGATIVE mg/dL   Hgb urine dipstick NEGATIVE  NEGATIVE   Bilirubin Urine NEGATIVE  NEGATIVE   Ketones, ur NEGATIVE  NEGATIVE mg/dL   Protein, ur NEGATIVE  NEGATIVE mg/dL   Urobilinogen, UA 0.2  0.0 - 1.0 mg/dL   Nitrite NEGATIVE  NEGATIVE  Leukocytes, UA NEGATIVE  NEGATIVE  LACTIC ACID, PLASMA      Result Value Range   Lactic Acid, Venous 1.1  0.5 - 2.2 mmol/L  TSH      Result Value Range   TSH 0.758  0.350 - 4.500 uIU/mL  CBC WITH DIFFERENTIAL      Result Value Range   WBC 9.2  4.0 - 10.5 K/uL   RBC 4.41  3.87 - 5.11 MIL/uL   Hemoglobin 13.7  12.0 - 15.0 g/dL   HCT 40.7  36.0 - 46.0 %   MCV 92.3  78.0 - 100.0 fL   MCH 31.1   26.0 - 34.0 pg   MCHC 33.7  30.0 - 36.0 g/dL   RDW 12.1  11.5 - 15.5 %   Platelets 260  150 - 400 K/uL   Neutrophils Relative % 71  43 - 77 %   Neutro Abs 6.5  1.7 - 7.7 K/uL   Lymphocytes Relative 21  12 - 46 %   Lymphs Abs 1.9  0.7 - 4.0 K/uL   Monocytes Relative 9  3 - 12 %   Monocytes Absolute 0.8  0.1 - 1.0 K/uL   Eosinophils Relative 0  0 - 5 %   Eosinophils Absolute 0.0  0.0 - 0.7 K/uL   Basophils Relative 0  0 - 1 %   Basophils Absolute 0.0  0.0 - 0.1 K/uL  BASIC METABOLIC PANEL      Result Value Range   Sodium 139  137 - 147 mEq/L   Potassium 3.4 (*) 3.7 - 5.3 mEq/L   Chloride 101  96 - 112 mEq/L   CO2 26  19 - 32 mEq/L   Glucose, Bld 250 (*) 70 - 99 mg/dL   BUN 9  6 - 23 mg/dL   Creatinine, Ser 0.45 (*) 0.50 - 1.10 mg/dL   Calcium 7.9 (*) 8.4 - 10.5 mg/dL   GFR calc non Af Amer >90  >90 mL/min   GFR calc Af Amer >90  >90 mL/min  CORTISOL-AM, BLOOD      Result Value Range   Cortisol - AM 12.8  4.3 - 22.4 ug/dL  GLUCOSE, CAPILLARY      Result Value Range   Glucose-Capillary 243 (*) 70 - 99 mg/dL   Comment 1 Documented in Chart     Comment 2 Notify RN    GLUCOSE, CAPILLARY      Result Value Range   Glucose-Capillary 202 (*) 70 - 99 mg/dL  HEMOGLOBIN A1C      Result Value Range   Hemoglobin A1C 10.0 (*) <5.7 %   Mean Plasma Glucose 240 (*) <117 mg/dL  GLUCOSE, CAPILLARY      Result Value Range   Glucose-Capillary 450 (*) 70 - 99 mg/dL  URINE MICROSCOPIC-ADD ON      Result Value Range   WBC, UA 0-2  <3 WBC/hpf   RBC / HPF 0-2  <3 RBC/hpf  GLUCOSE, CAPILLARY      Result Value Range   Glucose-Capillary 206 (*) 70 - 99 mg/dL  BASIC METABOLIC PANEL      Result Value Range   Sodium 137  137 - 147 mEq/L   Potassium 3.7  3.7 - 5.3 mEq/L   Chloride 98  96 - 112 mEq/L   CO2 25  19 - 32 mEq/L   Glucose, Bld 276 (*) 70 - 99 mg/dL   BUN 14  6 - 23 mg/dL   Creatinine, Ser 0.44 (*) 0.50 -  1.10 mg/dL   Calcium 8.0 (*) 8.4 - 10.5 mg/dL   GFR calc non Af Amer >90  >90  mL/min   GFR calc Af Amer >90  >90 mL/min  GLUCOSE, CAPILLARY      Result Value Range   Glucose-Capillary 317 (*) 70 - 99 mg/dL  GLUCOSE, CAPILLARY      Result Value Range   Glucose-Capillary 302 (*) 70 - 99 mg/dL  CG4 I-STAT (LACTIC ACID)      Result Value Range   Lactic Acid, Venous 1.33  0.5 - 2.2 mmol/L   Ct Head Wo Contrast  08/30/2013   CLINICAL DATA:  Altered mental status  EXAM: CT HEAD WITHOUT CONTRAST  TECHNIQUE: Contiguous axial images were obtained from the base of the skull through the vertex without intravenous contrast.  COMPARISON:  09/04/2011  FINDINGS: The bony calvarium is intact. The ventricles are of normal size and configuration. No findings to suggest acute hemorrhage, acute infarction or space-occupying mass lesion are noted.  IMPRESSION: No acute abnormality noted.   Electronically Signed   By: Inez Catalina M.D.   On: 08/30/2013 20:35   Dg Chest Port 1 View  08/30/2013   CLINICAL DATA:  Shortness of breath  EXAM: PORTABLE CHEST - 1 VIEW  COMPARISON:  12/12/2010  FINDINGS: Right lateral decubitus imaging performed due to patient's inability to be positioned upright. There is exclusion of the peripheral right chest as a result. Differential prominence of vascular markings and lung aeration is likely positional. Borderline cardiomegaly, size and morphology grossly stable from prior. No visible effusion or pneumothorax.  IMPRESSION: When accounting for decubitus positioning, no overt edema or consolidation.   Electronically Signed   By: Jorje Guild M.D.   On: 08/30/2013 21:16   Wbc elevated. Blood and urine cultures.  In ed, pt freely moving head/neck in all directions, no rigidity or meningismus.  Iv abx.  Ct.  Additional ivf.   Additional rx.  CRITICAL CARE  Re acutely altered mental status, delirium, fever, leucocytosis,  Performed by: Mirna Mires Total critical care time: 35 Critical care time was exclusive of separately billable procedures and treating  other patients. Critical care was necessary to treat or prevent imminent or life-threatening deterioration. Critical care was time spent personally by me on the following activities: development of treatment plan with patient and/or surrogate as well as nursing, discussions with consultants, evaluation of patient's response to treatment, examination of patient, obtaining history from patient or surrogate, ordering and performing treatments and interventions, ordering and review of laboratory studies, ordering and review of radiographic studies, pulse oximetry and re-evaluation of patient's condition.    Mirna Mires, MD 09/01/13 [phone]

## 2013-09-01 NOTE — Progress Notes (Signed)
Physical Therapy Discharge Patient Details Name: Margaret Fischer MRN: [id number] DOB: 11/03/1940 Today's Date: 09/01/2013 Time: 1222-1230 PT Time Calculation (min): 8 min  Patient discharged from PT services secondary to goals met and no further PT needs identified.  Please see latest therapy progress note for current level of functioning and progress toward goals.    Progress and discharge plan discussed with patient and/or caregiver: Patient/Caregiver agrees with plan  GP     , 09/01/2013, 1:06 PM  

## 2013-09-01 NOTE — Progress Notes (Signed)
Physical Therapy Treatment Patient Details Name: Margaret Fischer MRN: [id number] DOB: 1940/09/25 Today's Date: 09/01/2013 Time: 1222-1230 PT Time Calculation (min): 8 min  PT Assessment / Plan / Recommendation  History of Present Illness Pt adm with AMS, N/V, fever, and diarrhea.   PT Comments   Pt doing well with mobility and no further PT needed.  Ready for dc from PT standpoint.    Follow Up Recommendations  No PT follow up     Does the patient have the potential to tolerate intense rehabilitation     Barriers to Discharge        Equipment Recommendations       Recommendations for Other Services    Frequency     Progress towards PT Goals Progress towards PT goals: Goals met/education completed, patient discharged from PT  Plan Other (comment) (PT dc'd)    Precautions / Restrictions Precautions Precautions: None   Pertinent Vitals/Pain N/A    Mobility  Transfers Overall transfer level: Independent Equipment used: None Sit to Stand: Independent Ambulation/Gait Ambulation/Gait assistance: Modified independent (Device/Increase time) Ambulation Distance (Feet): 150 Feet Assistive device: None Gait Pattern/deviations: WFL(Within Functional Limits)    Exercises     PT Diagnosis:    PT Problem List:   PT Treatment Interventions:     PT Goals (current goals can now be found in the care plan section)    Visit Information  Last PT Received On: 09/01/13 Assistance Needed: +1 History of Present Illness: Pt adm with AMS, N/V, fever, and diarrhea.    Subjective Data      Cognition  Cognition Arousal/Alertness: Awake/alert Behavior During Therapy: WFL for tasks assessed/performed Overall Cognitive Status: Within Functional Limits for tasks assessed    Balance  Balance Standing balance support: No upper extremity supported Standing balance-Leahy Scale: Good  End of Session PT - End of Session Activity Tolerance: Patient tolerated treatment well Patient left:  in chair Nurse Communication: Mobility status   GP     Malorie Bigford 09/01/2013, 1:05 PM  [employer] PT [phone]

## 2013-09-01 NOTE — Discharge Summary (Signed)
Physician Discharge Summary  Margaret Fischer RKY:[id number] DOB: 1940/11/06 DOA: 08/30/2013  PCP: Delia Chimes, NP  Admit date: 08/30/2013 Discharge date: 09/01/2013  Time spent: 50  minutes  Recommendations for Outpatient Follow-up:  Follow up on finalized Blood Cultures Patient needs tighter glycemic control.  Hgb A1C is 10.  Patient needs diabetic education. Recommend decreasing benzodiazepine dose. Prescribed Diflucan for yeast infection  Discharge Diagnoses:  Principal Problem:   Sepsis Active Problems:   HTN (hypertension)   Altered mental state   Diabetes mellitus   Fibromyalgia   Acute encephalopathy   Discharge Condition: stable.  Diet recommendation: diabetic  Filed Weights   08/31/13 0211  Weight: 86.9 kg (191 lb 9.3 oz)    History of present illness:  Pt presented to the ED on 08/30/13 with altered mental status for one day.  Pt also experienced nausea and vomiting and watery diarrhea.  Pt has a PMH of HTN, Type 2 DM, hyperlipidemia and fibromyalgia.  Head CT was negative, UA negative for UTI, initial White count of 14,300 has trended down to 9200 over 24 hours.  Blood cultures still pending (currently (09/01/13) no growth).   Hospital Course:   Altered mental status -Pt presented to the hospital on 08/30/13 w/ altered mental status -Uncertain etiology, believed secondary to viral cause, pt improved within 24 hours -Head CT negative, UA negative for UTI, White count trended down on ABx, blood cultures pending -Mental status completely normalized the morning after admission.   Uncontrolled DM - Pt Hg a1c is 10 on 08/31/13 (was 7.9 2 years ago), Mean Plasma Glucose 240 (180 2 yrs ago) and capillary glucose is 302 on 09/01/13 -Pt reported taking levemir 60 units at night.   -D/C pt with levemir to inject 30 units BID and follow up with PCP for diabetic education in one week. -Patient will resume metformin at discharge as well.  Yeast infection -Pt reported yeast  infection currently been going on for previous 3 weeks -D/C pt with diflucan to take 1 tablet daily and f/u with PCP      Discharge Exam: Filed Vitals:   09/01/13 1000  BP: 151/67  Pulse: 80  Temp:   Resp:     General: Well developed, well nourished, NAD, appears stated age  HEENT: PERRLA, EOMI, Anicteic Sclera, MMM. No pharyngeal erythema or exudates  Neck: Supple, no JVD, no masses  Cardiovascular: RRR, S1 S2 auscultated, no rubs, murmurs or gallops.  Respiratory: Clear to auscultation bilaterally with equal chest rise  Abdomen: Soft, nontender, nondistended, + bowel sounds, Obese Extremities: warm dry without cyanosis, clubbing or edema  Neuro: Sensation intake in the hands and feet. 5/5 grip strength in hands. MSE normal  Skin: Redness present on hands with petechia present on extremities, trunk and face.  Psych: Normal affect and demeanor with intact judgement and insight.   Discharge Instructions      Discharge Orders   Future Orders Complete By Expires   Diet Carb Modified  As directed    Increase activity slowly  As directed        Medication List    STOP taking these medications       nitroGLYCERIN 0.4 MG SL tablet  Commonly known as:  NITROSTAT      TAKE these medications       amitriptyline 10 MG tablet  Commonly known as:  ELAVIL  Take 10 mg by mouth at bedtime as needed for sleep.     amLODipine 5 MG tablet  Commonly  known as:  NORVASC  Take 1 tablet (5 mg total) by mouth daily.     aspirin 325 MG tablet  Take 325 mg by mouth 2 (two) times daily.     B-12 PO  Take 1 tablet by mouth daily.     clonazePAM 2 MG tablet  Commonly known as:  KLONOPIN  Take 1 tablet (2 mg total) by mouth 3 (three) times daily as needed.     fluconazole 100 MG tablet  Commonly known as:  DIFLUCAN  Take 1 tablet (100 mg total) by mouth daily.     FLUoxetine 40 MG capsule  Commonly known as:  PROZAC  Take 80 mg by mouth daily.     furosemide 40 MG tablet   Commonly known as:  LASIX  Take 40 mg by mouth every other day.     gabapentin 300 MG capsule  Commonly known as:  NEURONTIN  Take 600 mg by mouth 3 (three) times daily.     glucosamine-chondroitin 500-400 MG tablet  Take 1 tablet by mouth 2 (two) times daily.     insulin detemir 100 UNIT/ML injection  Commonly known as:  LEVEMIR  Inject 0.3 mLs (30 Units total) into the skin 2 (two) times daily.     metFORMIN 1000 MG tablet  Commonly known as:  GLUCOPHAGE  Take 1,000 mg by mouth 2 (two) times daily with a meal.     pravastatin 40 MG tablet  Commonly known as:  PRAVACHOL  Take 1 tablet (40 mg total) by mouth daily.     VITAMIN D-3 PO  Take by mouth daily.       Allergies  Allergen Reactions  . Epinephrine     Tachycardia when used as a local anesthetic  . Hydromorphone     UNKNOWN  . Morphine And Related     UNKNOWN  . Phenergan [Promethazine Hcl]     " GOES WACK YOU WITH THIS MEDICATION" STATEMENT MADE BY PATIENT  . Succinylcholine   . Latex Rash   Follow-up Information   Follow up with [facility], NP. Schedule an appointment as soon as possible for a visit in 1 week.   Specialty:  Nurse Practitioner   Contact information:   [place] [address] [phone]        The results of significant diagnostics from this hospitalization (including imaging, microbiology, ancillary and laboratory) are listed below for reference.    Significant Diagnostic Studies: Ct Head Wo Contrast  08/30/2013   CLINICAL DATA:  Altered mental status  EXAM: CT HEAD WITHOUT CONTRAST  TECHNIQUE: Contiguous axial images were obtained from the base of the skull through the vertex without intravenous contrast.  COMPARISON:  09/04/2011  FINDINGS: The bony calvarium is intact. The ventricles are of normal size and configuration. No findings to suggest acute hemorrhage, acute infarction or space-occupying mass lesion are noted.  IMPRESSION: No acute abnormality  noted.   Electronically Signed   By: Inez Catalina M.D.   On: 08/30/2013 20:35   Dg Chest Port 1 View  08/30/2013   CLINICAL DATA:  Shortness of breath  EXAM: PORTABLE CHEST - 1 VIEW  COMPARISON:  12/12/2010  FINDINGS: Right lateral decubitus imaging performed due to patient's inability to be positioned upright. There is exclusion of the peripheral right chest as a result. Differential prominence of vascular markings and lung aeration is likely positional. Borderline cardiomegaly, size and morphology grossly stable from prior. No visible effusion or pneumothorax.  IMPRESSION: When  accounting for decubitus positioning, no overt edema or consolidation.   Electronically Signed   By: Jorje Guild M.D.   On: 08/30/2013 21:16    Microbiology: Recent Results (from the past 240 hour(s))  CULTURE, BLOOD (ROUTINE X 2)     Status: None   Collection Time    08/30/13  8:45 PM      Result Value Range Status   Specimen Description BLOOD RIGHT WRIST   Final   Special Requests BOTTLES DRAWN AEROBIC AND ANAEROBIC 10CC EA   Final   Culture  Setup Time     Final   Value: 08/31/2013 00:04     Performed at [facility]   Culture     Final   Value:        BLOOD CULTURE RECEIVED NO GROWTH TO DATE CULTURE WILL BE HELD FOR 5 DAYS BEFORE ISSUING A FINAL NEGATIVE REPORT     Performed at [facility]   Report Status PENDING   Incomplete  CULTURE, BLOOD (ROUTINE X 2)     Status: None   Collection Time    08/30/13  8:52 PM      Result Value Range Status   Specimen Description BLOOD RIGHT FOREARM   Final   Special Requests BOTTLES DRAWN AEROBIC ONLY 10CC   Final   Culture  Setup Time     Final   Value: 08/31/2013 00:04     Performed at [facility]   Culture     Final   Value:        BLOOD CULTURE RECEIVED NO GROWTH TO DATE CULTURE WILL BE HELD FOR 5 DAYS BEFORE ISSUING A FINAL NEGATIVE REPORT     Performed at [facility]   Report Status PENDING   Incomplete     Labs: Basic  Metabolic Panel:  Recent Labs Lab 08/30/13 1828 08/31/13 0314 09/01/13 0630  NA 136* 139 137  K 3.9 3.4* 3.7  CL 93* 101 98  CO2 25 26 25   GLUCOSE 254* 250* 276*  BUN 12 9 14   CREATININE 0.38* 0.45* 0.44*  CALCIUM 8.8 7.9* 8.0*   Liver Function Tests:  Recent Labs Lab 08/30/13 1828  AST 22  ALT 14  ALKPHOS 118*  BILITOT 0.7  PROT 7.2  ALBUMIN 4.2   CBC:  Recent Labs Lab 08/30/13 1828 08/31/13 0314  WBC 14.3* 9.2  NEUTROABS  --  6.5  HGB 16.5* 13.7  HCT 48.0* 40.7  MCV 93.0 92.3  PLT 327 260   CBG:  Recent Labs Lab 08/31/13 0744 08/31/13 1316 08/31/13 1641 08/31/13 2048 09/01/13 0840  GLUCAP 202* 450* 206* 317* 302*       Signed:  Melton Alar, PA-C Cameron Proud, PA-S  [facility] 09/01/2013, 11:10 AM

## 2013-09-06 LAB — CULTURE, BLOOD (ROUTINE X 2)
CULTURE: NO GROWTH
Culture: NO GROWTH

## 2013-09-08 ENCOUNTER — Other Ambulatory Visit: Payer: Self-pay | Admitting: Neurology

## 2013-09-08 NOTE — Telephone Encounter (Signed)
Former [facility] patient.  Has an appt with Dr Janann Colonel

## 2013-09-09 ENCOUNTER — Encounter: Payer: Self-pay | Admitting: Neurology

## 2013-09-10 ENCOUNTER — Ambulatory Visit: Payer: Self-pay | Admitting: Neurology

## 2013-09-17 ENCOUNTER — Encounter: Payer: Self-pay | Admitting: Neurology

## 2013-09-17 ENCOUNTER — Ambulatory Visit (INDEPENDENT_AMBULATORY_CARE_PROVIDER_SITE_OTHER): Payer: Medicare Other | Admitting: Neurology

## 2013-09-17 VITALS — BP 177/80 | HR 61 | Ht 67.0 in | Wt 190.0 lb

## 2013-09-17 DIAGNOSIS — R269 Unspecified abnormalities of gait and mobility: Secondary | ICD-10-CM

## 2013-09-17 DIAGNOSIS — R2681 Unsteadiness on feet: Secondary | ICD-10-CM

## 2013-09-17 MED ORDER — AMITRIPTYLINE HCL 10 MG PO TABS: 15.0000 mg | ORAL_TABLET | Freq: Every day | ORAL | Status: DC

## 2013-09-17 NOTE — Patient Instructions (Signed)
Overall you are doing fairly well but I do want to suggest a few things today:   Remember to drink plenty of fluid, eat healthy meals and do not skip any meals. Try to eat protein with a every meal and eat a healthy snack such as fruit or nuts in between meals. Try to keep a regular sleep-wake schedule and try to exercise daily, particularly in the form of walking, 20-30 minutes a day, if you can.   As far as your medications are concerned, I would like to suggest the following: 1)Increase the amitriptyline to 15mg  nightly. This will be 1.5 tablets nightly  I am sending you to physical therapy to work on your gait.    I would like to see you back in 6 months, sooner if we need to. Please call us with any interim questions, concerns, problems, updates or refill requests.   Please also call us for any test results so we can go over those with you on the phone.  My clinical assistant and will answer any of your questions and relay your messages to me and also relay most of my messages to you.   Our phone number is [phone]. We also have an after hours call service for urgent matters and there is a physician on-call for urgent questions. For any emergencies you know to call 911 or go to the nearest emergency room

## 2013-09-17 NOTE — Progress Notes (Signed)
[facility]    Provider:  Dr Janann Colonel Referring Provider: Delia Chimes, NP Primary Care Physician:  Delia Chimes, NP  CC:  headache  HPI:  Margaret Fischer is a 73 y.o. female here as a referral from Dr. Toy Cookey for headache follow up  She reports that she was recently in the hospital for dehydration and altered mental status. She feels she has not regained her energy since being discharged. She notes that she continues to have elevated blood sugars (most recent 275 and then 169). She reports she is scheduled to see her primary care physician in 2 weeks or 1 month depending on how she feels.   She feels that the Elavil has helped her headache. She is currently taking 10mg  nightly. Tolerating it well. She currently takes a klonopin 2mg  nightly, will occasionally take a klonopin during the daytime too.   Patient had her oxycodone/percocet discontinued by Dr Nelva Bush due to a drug test positive for marijuana.(Test was 2-3 months ago). She has not found a new pain doctor and is off all narcotics.   Prior visit with Dr Erling Cruz 04/2012: 72y/o hx of headaches, following head trauma in 2010, and TIA presenting for follow up. Has history of gait disorder, frequent headaches, "ice pick" headaches. Continues to have frequent headaches, occur 2-3 times a week. Takes oxycodone 5x a day for headache and back pain and headache. Has also been given neuronton for headache and back pain. MRI brain done 11/2009 showed nonspecific subcortical and periventriculra white matter hyperintensities. Had transient R sided numbness with CT rasiing question of new thalamic infarct (though on the right side).   Review of Systems: Out of a complete 14 system review, the patient complains of only the following symptoms, and all other reviewed systems are negative. Positive diarrhea vomiting headache joint pain joint swelling back pain muscle cramps walking difficulty chest tightness  History   Social History  .  Marital Status: Divorced    Spouse Name: N/A    Number of Children: 2  . Years of Education: N/A   Occupational History  .     Social History Main Topics  . Smoking status: Former Smoker    Quit date: 12/25/1983  . Smokeless tobacco: Never Used  . Alcohol Use: No  . Drug Use: No  . Sexual Activity: No   Other Topics Concern  . Not on file   Social History Narrative   Patient is divorced and lives with her daughter.     Patient is right-handed.   Patient has two grown children.   Patient drinks 4-6 cups of coffee.    Family History  Problem Relation Age of Onset  . Lymphoma Mother   . Diabetic kidney disease Mother   . Diabetic kidney disease Brother   . Heart disease Sister   . Cancer Sister   . Heart attack Brother   . Myasthenia gravis Brother     Past Medical History  Diagnosis Date  . Diabetes mellitus     Insulin dependent  . Chest pain, non-cardiac     History of 2 normal cardiac catheterizations  . Bradycardia   . Dizziness     Chronic  . Fibromyalgia   . Neuropathy   . Chronic chest pain   . Hyperlipidemia   . Chronic neck pain   . Chronic back pain   . Basal cell carcinoma of nose   . Depression   . Thyroid cyst   . Goiter   . 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 Hypertension   . Depression   . Anxiety   . Migraine     Past Surgical History  Procedure Laterality Date  . Total knee arthroplasty    . Cardiac catheterization      History of 2 caths, reportedly normal  . Joint replacement    . Rotator cuff repair    . Appendectomy    . Cystectomy    . Thyroid surgery    . Breast lumpectomy    . Abdominal hysterectomy    . Biceps tendon repair    . Refractive surgery    . Right nares  09/2011    basal cell surgery  . Cholecystectomy    . Vesicovaginal fistula closure w/ tah    . Lasik      Current Outpatient Prescriptions  Medication Sig Dispense Refill  . amitriptyline (ELAVIL) 10 MG tablet TAKE 1 TABLET (10 MG TOTAL) BY MOUTH AT  BEDTIME.  30 tablet  0  . amLODipine (NORVASC) 5 MG tablet Take 1 tablet (5 mg total) by mouth daily.  30 tablet  11  . aspirin 325 MG tablet Take 325 mg by mouth 2 (two) times daily.       . Cholecalciferol (VITAMIN D-3 PO) Take by mouth daily.        . clonazePAM (KLONOPIN) 2 MG tablet Take 1 tablet (2 mg total) by mouth 3 (three) times daily as needed.  30 tablet  0  . Cyanocobalamin (B-12 PO) Take 1 tablet by mouth daily.      . fluconazole (DIFLUCAN) 100 MG tablet Take 1 tablet (100 mg total) by mouth daily.  5 tablet  0  . FLUoxetine (PROZAC) 40 MG capsule Take 80 mg by mouth daily.       . furosemide (LASIX) 40 MG tablet Take 40 mg by mouth every other day.       . gabapentin (NEURONTIN) 300 MG capsule Take 600 mg by mouth 3 (three) times daily.      Marland Kitchen glucosamine-chondroitin 500-400 MG tablet Take 1 tablet by mouth 2 (two) times daily.        . insulin detemir (LEVEMIR) 100 UNIT/ML injection Inject 0.3 mLs (30 Units total) into the skin 2 (two) times daily.  10 mL  11  . insulin glargine (LANTUS) 100 UNIT/ML injection Inject 100 Units into the skin at bedtime.      . metFORMIN (GLUCOPHAGE) 1000 MG tablet Take 1,000 mg by mouth 2 (two) times daily with a meal.      . nitroGLYCERIN (NITROSTAT) 0.4 MG SL tablet Place 0.4 mg under the tongue every 5 (five) minutes as needed for chest pain.      Marland Kitchen oxyCODONE-acetaminophen (PERCOCET) 10-325 MG per tablet Take 1 tablet by mouth every 6 (six) hours as needed for pain.      . potassium chloride (KLOR-CON) 20 MEQ packet Take 20 mEq by mouth 2 (two) times daily.      . pravastatin (PRAVACHOL) 40 MG tablet Take 1 tablet (40 mg total) by mouth daily.  30 tablet  3   No current facility-administered medications for this visit.    Allergies as of 09/17/2013 - Review Complete 08/31/2013  Allergen Reaction Noted  . Epinephrine  01/08/2011  . Hydromorphone  01/08/2011  . Morphine and related  01/08/2011  . Phenergan [promethazine hcl]  01/08/2011  .  Succinylcholine  01/08/2011  . Latex Rash 08/30/2013    Vitals: There were no vitals taken for  this visit. Last Weight:  Wt Readings from Last 1 Encounters:  08/31/13 191 lb 9.3 oz (86.9 kg)   Last Height:   Ht Readings from Last 1 Encounters:  08/31/13 5\' 8"  (1.727 m)     Physical exam: Exam: Gen: NAD, conversant Eyes: anicteric sclerae, moist conjunctivae HENT: Atraumatic, oropharynx clear Neck: Trachea midline; supple,  Lungs: CTA, no wheezing, rales, rhonic                          CV: RRR, no MRG Abdomen: Soft, non-tender;  Extremities: No peripheral edema  Skin: Normal temperature, no rash,  Psych: Appropriate affect, pleasant  Neuro: MS: AA&Ox3, appropriately interactive, normal affect   MMSE 28/30 at last visit (04/2012)  Speech: fluent w/o paraphasic error  Memory: good recent and remote recall  CN: PERRL, ?OS temporal VF cut, EOMI no nystagmus, mild R ptosis, sensation intact to LT V1-V3 bilat, face symmetric, no weakness, hearing grossly intact, palate elevates symmetrically, shoulder shrug 5/5 bilat,  tongue protrudes midline, no fasiculations noted.  Motor: normal bulk and tone Strength: 5/5  In all extremities  Coord: rapid alternating and point-to-point (FNF, HTS) movements intact.  Reflexes: decreased but symmetrical, absent achilles bilat downgoing toes  Sens:decreased vibration bilat LE, decreased proprioception, PP bilat LE  Gait: Wide based, walks with cane, upright, slow shuffling, did not attempt toe/heel or tandem walking  Assessment:  After physical and neurologic examination, review of laboratory studies, imaging, neurophysiology testing and pre-existing records, assessment will be reviewed on the problem list.  Plan:  Treatment plan and additional workup will be reviewed under Problem List.  1)Gait instability 2)Headache 3)Depression  73y/o woman presenting for follow up visit, with last office visit in 2013. Had recent hospital  stay for AMS, since discharge feels her energy has not returned. Currently taking Elavil 10mg  nightly for headache, will titrate up to 15mg  nightly to see if improvement in symptoms. Will refer patient to PT for gait training. Counseled patient to increase exercise as tolerated. Overall appears very depressed, denies suicidal ideation. Would potentially benefit from psychiatry evaluation or adjustment of antidepressant. Counseled to follow up with PCP for continued optimization of blood sugars. Follow up in 6 months.    Jim Like, DO  [facility] [address] [place],  [address]  Phone [phone] Fax [phone]

## 2013-12-04 ENCOUNTER — Emergency Department (HOSPITAL_COMMUNITY): Payer: Medicare Other

## 2013-12-04 ENCOUNTER — Encounter (HOSPITAL_COMMUNITY): Payer: Self-pay | Admitting: Emergency Medicine

## 2013-12-04 ENCOUNTER — Inpatient Hospital Stay (HOSPITAL_COMMUNITY)
Admission: EM | Admit: 2013-12-04 | Discharge: 2013-12-08 | DRG: 071 | Disposition: A | Discharge status: Home Health | Payer: Medicare Other | Attending: Family Medicine | Admitting: Family Medicine

## 2013-12-04 DIAGNOSIS — Z85828 Personal history of other malignant neoplasm of skin: Secondary | ICD-10-CM

## 2013-12-04 DIAGNOSIS — E119 Type 2 diabetes mellitus without complications: Secondary | ICD-10-CM

## 2013-12-04 DIAGNOSIS — Z8673 Personal history of transient ischemic attack (TIA), and cerebral infarction without residual deficits: Secondary | ICD-10-CM

## 2013-12-04 DIAGNOSIS — E872 Acidosis, unspecified: Secondary | ICD-10-CM | POA: Diagnosis present

## 2013-12-04 DIAGNOSIS — Z7982 Long term (current) use of aspirin: Secondary | ICD-10-CM

## 2013-12-04 DIAGNOSIS — B370 Candidal stomatitis: Secondary | ICD-10-CM | POA: Diagnosis present

## 2013-12-04 DIAGNOSIS — R451 Restlessness and agitation: Secondary | ICD-10-CM

## 2013-12-04 DIAGNOSIS — Z96659 Presence of unspecified artificial knee joint: Secondary | ICD-10-CM

## 2013-12-04 DIAGNOSIS — F329 Major depressive disorder, single episode, unspecified: Secondary | ICD-10-CM | POA: Diagnosis present

## 2013-12-04 DIAGNOSIS — I10 Essential (primary) hypertension: Secondary | ICD-10-CM

## 2013-12-04 DIAGNOSIS — Z794 Long term (current) use of insulin: Secondary | ICD-10-CM

## 2013-12-04 DIAGNOSIS — IMO0001 Reserved for inherently not codable concepts without codable children: Secondary | ICD-10-CM | POA: Diagnosis present

## 2013-12-04 DIAGNOSIS — R6510 Systemic inflammatory response syndrome (SIRS) of non-infectious origin without acute organ dysfunction: Secondary | ICD-10-CM | POA: Diagnosis present

## 2013-12-04 DIAGNOSIS — R4182 Altered mental status, unspecified: Secondary | ICD-10-CM

## 2013-12-04 DIAGNOSIS — G9340 Encephalopathy, unspecified: Secondary | ICD-10-CM

## 2013-12-04 DIAGNOSIS — E785 Hyperlipidemia, unspecified: Secondary | ICD-10-CM | POA: Diagnosis present

## 2013-12-04 DIAGNOSIS — E876 Hypokalemia: Secondary | ICD-10-CM | POA: Diagnosis present

## 2013-12-04 DIAGNOSIS — R739 Hyperglycemia, unspecified: Secondary | ICD-10-CM

## 2013-12-04 DIAGNOSIS — A419 Sepsis, unspecified organism: Secondary | ICD-10-CM

## 2013-12-04 DIAGNOSIS — R509 Fever, unspecified: Secondary | ICD-10-CM

## 2013-12-04 DIAGNOSIS — Z87891 Personal history of nicotine dependence: Secondary | ICD-10-CM

## 2013-12-04 DIAGNOSIS — F411 Generalized anxiety disorder: Secondary | ICD-10-CM | POA: Diagnosis present

## 2013-12-04 DIAGNOSIS — F3289 Other specified depressive episodes: Secondary | ICD-10-CM | POA: Diagnosis present

## 2013-12-04 DIAGNOSIS — G43909 Migraine, unspecified, not intractable, without status migrainosus: Secondary | ICD-10-CM | POA: Diagnosis present

## 2013-12-04 LAB — I-STAT TROPONIN, ED: TROPONIN I, POC: 0.01 ng/mL (ref 0.00–0.08)

## 2013-12-04 LAB — CBC
HCT: 49.2 % — ABNORMAL HIGH (ref 36.0–46.0)
HEMATOCRIT: 45.9 % (ref 36.0–46.0)
HEMOGLOBIN: 16.9 g/dL — AB (ref 12.0–15.0)
HEMOGLOBIN: 16 g/dL — AB (ref 12.0–15.0)
MCH: 32 pg (ref 26.0–34.0)
MCH: 32.3 pg (ref 26.0–34.0)
MCHC: 34.3 g/dL (ref 30.0–36.0)
MCHC: 34.9 g/dL (ref 30.0–36.0)
MCV: 93.2 fL (ref 78.0–100.0)
MCV: 92.7 fL (ref 78.0–100.0)
Platelets: 340 10*3/uL (ref 150–400)
Platelets: 352 10*3/uL (ref 150–400)
RBC: 5.28 MIL/uL — ABNORMAL HIGH (ref 3.87–5.11)
RBC: 4.95 MIL/uL (ref 3.87–5.11)
RDW: 12.5 % (ref 11.5–15.5)
RDW: 12.5 % (ref 11.5–15.5)
WBC: 9.7 10*3/uL (ref 4.0–10.5)
WBC: 16.1 10*3/uL — ABNORMAL HIGH (ref 4.0–10.5)

## 2013-12-04 LAB — COMPREHENSIVE METABOLIC PANEL
ALBUMIN: 4.3 g/dL (ref 3.5–5.2)
ALK PHOS: 118 U/L — AB (ref 39–117)
ALT: 13 U/L (ref 0–35)
AST: 26 U/L (ref 0–37)
BUN: 13 mg/dL (ref 6–23)
CO2: 22 mEq/L (ref 19–32)
Calcium: 9.6 mg/dL (ref 8.4–10.5)
Chloride: 96 mEq/L (ref 96–112)
Creatinine, Ser: 0.49 mg/dL — ABNORMAL LOW (ref 0.50–1.10)
GFR calc Af Amer: >90 mL/min (ref >90)
GFR calc non Af Amer: >90 mL/min (ref >90)
Glucose, Bld: 234 mg/dL — ABNORMAL HIGH (ref 70–99)
Potassium: 3.4 mEq/L — ABNORMAL LOW (ref 3.7–5.3)
SODIUM: 140 meq/L (ref 137–147)
TOTAL PROTEIN: 7.4 g/dL (ref 6.0–8.3)
Total Bilirubin: 1.2 mg/dL (ref 0.3–1.2)

## 2013-12-04 LAB — DIFFERENTIAL
BASOS ABS: 0 10*3/uL (ref 0.0–0.1)
BASOS PCT: 0 % (ref 0–1)
Eosinophils Absolute: 0 10*3/uL (ref 0.0–0.7)
Eosinophils Relative: 0 % (ref 0–5)
Lymphocytes Relative: 12 % (ref 12–46)
Lymphs Abs: 1.2 10*3/uL (ref 0.7–4.0)
Monocytes Absolute: 0.4 10*3/uL (ref 0.1–1.0)
Monocytes Relative: 4 % (ref 3–12)
NEUTROS PCT: 84 % — AB (ref 43–77)
Neutro Abs: 8.1 10*3/uL — ABNORMAL HIGH (ref 1.7–7.7)

## 2013-12-04 LAB — URINALYSIS, ROUTINE W REFLEX MICROSCOPIC
BILIRUBIN URINE: NEGATIVE
Glucose, UA: >1000 mg/dL — AB
Hgb urine dipstick: NEGATIVE
Leukocytes, UA: NEGATIVE
Nitrite: NEGATIVE
PH: 6.5 (ref 5.0–8.0)
Protein, ur: 100 mg/dL — AB
Specific Gravity, Urine: 1.02 (ref 1.005–1.030)
Urobilinogen, UA: 0.2 mg/dL (ref 0.0–1.0)

## 2013-12-04 LAB — GRAM STAIN: Gram Stain: NONE SEEN

## 2013-12-04 LAB — RAPID URINE DRUG SCREEN, HOSP PERFORMED
Amphetamines: NOT DETECTED
BENZODIAZEPINES: NOT DETECTED
Barbiturates: NOT DETECTED
COCAINE: NOT DETECTED
Opiates: NOT DETECTED
TETRAHYDROCANNABINOL: NOT DETECTED

## 2013-12-04 LAB — BASIC METABOLIC PANEL
BUN: 10 mg/dL (ref 6–23)
CO2: 19 meq/L (ref 19–32)
Calcium: 8.3 mg/dL — ABNORMAL LOW (ref 8.4–10.5)
Chloride: 99 mEq/L (ref 96–112)
Creatinine, Ser: 0.44 mg/dL — ABNORMAL LOW (ref 0.50–1.10)
GFR calc Af Amer: >90 mL/min (ref >90)
GFR calc non Af Amer: >90 mL/min (ref >90)
GLUCOSE: 247 mg/dL — AB (ref 70–99)
Potassium: 4 mEq/L (ref 3.7–5.3)
SODIUM: 137 meq/L (ref 137–147)

## 2013-12-04 LAB — CSF CELL COUNT WITH DIFFERENTIAL
Eosinophils, CSF: NONE SEEN % (ref 0–1)
RBC COUNT CSF: 390 /mm3 — AB
RBC Count, CSF: 595 /mm3 — ABNORMAL HIGH
Tube #: 1
Tube #: 4
WBC CSF: 1 /mm3 (ref 0–5)
WBC, CSF: 1 /mm3 (ref 0–5)

## 2013-12-04 LAB — URINE MICROSCOPIC-ADD ON

## 2013-12-04 LAB — PROTIME-INR
INR: 0.97 (ref 0.00–1.49)
Prothrombin Time: 12.7 seconds (ref 11.6–15.2)

## 2013-12-04 LAB — ETHANOL: Alcohol, Ethyl (B): <11 mg/dL (ref 0–11)

## 2013-12-04 LAB — I-STAT CG4 LACTIC ACID, ED: Lactic Acid, Venous: 4.91 mmol/L — ABNORMAL HIGH (ref 0.5–2.2)

## 2013-12-04 LAB — GLUCOSE, CAPILLARY: GLUCOSE-CAPILLARY: 255 mg/dL — AB (ref 70–99)

## 2013-12-04 LAB — GLUCOSE, CSF: Glucose, CSF: 116 mg/dL — ABNORMAL HIGH (ref 43–76)

## 2013-12-04 LAB — KETONES, QUALITATIVE

## 2013-12-04 LAB — MRSA PCR SCREENING: MRSA by PCR: NEGATIVE

## 2013-12-04 LAB — APTT: aPTT: 21 seconds — ABNORMAL LOW (ref 24–37)

## 2013-12-04 LAB — PROTEIN, CSF: Total  Protein, CSF: 35 mg/dL (ref 15–45)

## 2013-12-04 MED ORDER — DEXTROSE 5 % IV SOLN
2.0000 g | Freq: Two times a day (BID) | INTRAVENOUS | Status: DC
  Filled 2013-12-04: qty 2

## 2013-12-04 MED ORDER — LORAZEPAM 2 MG/ML IJ SOLN
INTRAMUSCULAR | Status: AC
  Filled 2013-12-04: qty 1

## 2013-12-04 MED ORDER — INSULIN DETEMIR 100 UNIT/ML ~~LOC~~ SOLN
30.0000 [IU] | Freq: Two times a day (BID) | SUBCUTANEOUS | Status: DC
  Administered 2013-12-04 – 2013-12-08 (×7): 30 [IU] via SUBCUTANEOUS
  Filled 2013-12-04 (×9): qty 0.3

## 2013-12-04 MED ORDER — DEXTROSE 5 % IV SOLN
2.0000 g | INTRAVENOUS | Status: DC
  Administered 2013-12-04: 2 g via INTRAVENOUS
  Filled 2013-12-04: qty 2

## 2013-12-04 MED ORDER — HEPARIN SODIUM (PORCINE) 5000 UNIT/ML IJ SOLN
5000.0000 [IU] | Freq: Three times a day (TID) | INTRAMUSCULAR | Status: DC
  Administered 2013-12-04 – 2013-12-08 (×12): 5000 [IU] via SUBCUTANEOUS
  Filled 2013-12-04 (×14): qty 1

## 2013-12-04 MED ORDER — LORAZEPAM 2 MG/ML IJ SOLN
1.0000 mg | Freq: Once | INTRAMUSCULAR | Status: AC
  Administered 2013-12-04: 1 mg via INTRAVENOUS
  Filled 2013-12-04: qty 1

## 2013-12-04 MED ORDER — SODIUM CHLORIDE 0.45 % IV SOLN
INTRAVENOUS | Status: DC
  Administered 2013-12-04 – 2013-12-07 (×4): via INTRAVENOUS

## 2013-12-04 MED ORDER — INSULIN ASPART 100 UNIT/ML ~~LOC~~ SOLN
0.0000 [IU] | Freq: Three times a day (TID) | SUBCUTANEOUS | Status: DC
  Administered 2013-12-05: 3 [IU] via SUBCUTANEOUS
  Administered 2013-12-05 (×2): 2 [IU] via SUBCUTANEOUS
  Administered 2013-12-06 (×2): 3 [IU] via SUBCUTANEOUS
  Administered 2013-12-07: 8 [IU] via SUBCUTANEOUS
  Administered 2013-12-07 – 2013-12-08 (×4): 3 [IU] via SUBCUTANEOUS

## 2013-12-04 MED ORDER — SODIUM CHLORIDE 0.9 % IV BOLUS (SEPSIS)
1000.0000 mL | Freq: Once | INTRAVENOUS | Status: AC
  Administered 2013-12-04: 1000 mL via INTRAVENOUS

## 2013-12-04 MED ORDER — VANCOMYCIN HCL 10 G IV SOLR
15.0000 mg/kg | Freq: Once | INTRAVENOUS | Status: DC
  Filled 2013-12-04: qty 1293

## 2013-12-04 MED ORDER — ONDANSETRON HCL 4 MG/2ML IJ SOLN
4.0000 mg | Freq: Once | INTRAMUSCULAR | Status: AC
  Administered 2013-12-04: 4 mg via INTRAVENOUS

## 2013-12-04 MED ORDER — ONDANSETRON HCL 4 MG/2ML IJ SOLN
INTRAMUSCULAR | Status: AC
  Filled 2013-12-04: qty 2

## 2013-12-04 MED ORDER — INSULIN ASPART 100 UNIT/ML ~~LOC~~ SOLN
0.0000 [IU] | Freq: Every day | SUBCUTANEOUS | Status: DC
  Administered 2013-12-04: 3 [IU] via SUBCUTANEOUS
  Administered 2013-12-06: 2 [IU] via SUBCUTANEOUS

## 2013-12-04 MED ORDER — LORAZEPAM 2 MG/ML IJ SOLN
1.0000 mg | Freq: Once | INTRAMUSCULAR | Status: AC
Start: 2013-12-04 — End: 2013-12-04
  Administered 2013-12-04: 1 mg via INTRAVENOUS
  Filled 2013-12-04: qty 1

## 2013-12-04 MED ORDER — ACETAMINOPHEN 650 MG RE SUPP
650.0000 mg | Freq: Once | RECTAL | Status: AC
  Administered 2013-12-04: 650 mg via RECTAL
  Filled 2013-12-04: qty 1

## 2013-12-04 MED ORDER — SODIUM CHLORIDE 0.9 % IJ SOLN
3.0000 mL | Freq: Two times a day (BID) | INTRAMUSCULAR | Status: DC
  Administered 2013-12-05 – 2013-12-06 (×5): 3 mL via INTRAVENOUS

## 2013-12-04 MED ORDER — VANCOMYCIN HCL 10 G IV SOLR
1250.0000 mg | Freq: Once | INTRAVENOUS | Status: AC
  Administered 2013-12-04: 1250 mg via INTRAVENOUS
  Filled 2013-12-04: qty 1250

## 2013-12-04 MED ORDER — DEXTROSE 5 % IV SOLN
850.0000 mg | Freq: Three times a day (TID) | INTRAVENOUS | Status: DC
  Administered 2013-12-04: 850 mg via INTRAVENOUS
  Filled 2013-12-04 (×2): qty 17

## 2013-12-04 MED ORDER — VANCOMYCIN HCL IN DEXTROSE 1-5 GM/200ML-% IV SOLN
1000.0000 mg | Freq: Two times a day (BID) | INTRAVENOUS | Status: DC
  Administered 2013-12-05 – 2013-12-07 (×5): 1000 mg via INTRAVENOUS
  Filled 2013-12-04 (×6): qty 200

## 2013-12-04 MED ORDER — HYDRALAZINE HCL 20 MG/ML IJ SOLN: 5.0000 mg | Freq: Four times a day (QID) | INTRAMUSCULAR | Status: DC | PRN

## 2013-12-04 MED ORDER — LORAZEPAM 2 MG/ML IJ SOLN
1.0000 mg | Freq: Once | INTRAMUSCULAR | Status: AC
  Administered 2013-12-04: 1 mg via INTRAVENOUS

## 2013-12-04 MED ORDER — SODIUM CHLORIDE 0.9 % IV SOLN
2.0000 g | INTRAVENOUS | Status: DC
  Administered 2013-12-04: 2 g via INTRAVENOUS
  Filled 2013-12-04 (×4): qty 2000

## 2013-12-04 MED ORDER — PIPERACILLIN-TAZOBACTAM 3.375 G IVPB
3.3750 g | Freq: Three times a day (TID) | INTRAVENOUS | Status: DC
  Administered 2013-12-05 – 2013-12-07 (×8): 3.375 g via INTRAVENOUS
  Filled 2013-12-04 (×10): qty 50

## 2013-12-04 MED ORDER — POTASSIUM CHLORIDE 10 MEQ/100ML IV SOLN
10.0000 meq | INTRAVENOUS | Status: AC
  Administered 2013-12-04 – 2013-12-05 (×3): 10 meq via INTRAVENOUS
  Filled 2013-12-04 (×5): qty 100

## 2013-12-04 NOTE — ED Notes (Signed)
Pt calm and sedated from ativan, lying on left side.  Pt obtained a skin abrasion to right shin from rubbing into metal hand rail during the LP.  Dr. Christy Gentles aware, dressing applied

## 2013-12-04 NOTE — ED Notes (Addendum)
Per EMS:  Daughter stated patient had been sick for a couple of days, unknown symptoms.  CBG 200, BP 200/100.  Nonverbal, pulls away from things, not following commands.  Pt received 4mg  Zofran.  Pt woke up with these symptoms per Dr. Christy Gentles who spoke with daughter

## 2013-12-04 NOTE — ED Notes (Signed)
Family aware of POC

## 2013-12-04 NOTE — ED Notes (Signed)
Pt remains alert and nonverbal.  Pt is back from CT scan, no vomiting during scan.  Family to bedside and lab drawing blood cultures.  Pt is moving all extremities at present.

## 2013-12-04 NOTE — Progress Notes (Signed)
Patient received to room 3s bed 14, not following any commands at this time, resting once gown removed patient began moving in the bed not following any commands. Vitals obtained, husband at bedside.

## 2013-12-04 NOTE — ED Notes (Signed)
Attempted Report 

## 2013-12-04 NOTE — ED Provider Notes (Signed)
CSN: [id number]     Arrival date & time 12/04/13  1208 History   First MD Initiated Contact with Patient 12/04/13 1212     Chief Complaint  Patient presents with  . Altered Mental Status  . Emesis  . Hypertension    LEVEL 5 CAVEAT FOR ALTERED MENTAL STATUS   Patient is a 73 y.o. female presenting with altered mental status, vomiting, and hypertension. The history is provided by the EMS personnel and a relative. The history is limited by the condition of the patient.  Altered Mental Status Presenting symptoms: confusion   Severity:  Severe Most recent episode:  Today Timing:  Constant Progression:  Worsening Chronicity:  New Associated symptoms: vomiting   Emesis Hypertension  pt presents from home for altered mental status and vomiting Per daughter Page who I spoke to on the phone [phone]) She reports pt woke up with symptoms of confusion and difficulty speaking She had otherwise been at her baseline but did have recent anxiety and "stressed out".  No falls reported.  No overdose is reported per family.  She reports similar to prior episodes of UTI  Past Medical History  Diagnosis Date  . Diabetes mellitus     Insulin dependent  . Chest pain, non-cardiac     History of 2 normal cardiac catheterizations  . Bradycardia   . Dizziness     Chronic  . Fibromyalgia   . Neuropathy   . Chronic chest pain   . Hyperlipidemia   . Chronic neck pain   . Chronic back pain   . Basal cell carcinoma of nose   . Depression   . Thyroid cyst   . Goiter   . 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 Hypertension   . Depression   . Anxiety   . Migraine    Past Surgical History  Procedure Laterality Date  . Total knee arthroplasty    . Cardiac catheterization      History of 2 caths, reportedly normal  . Joint replacement    . Rotator cuff repair    . Appendectomy    . Cystectomy    . Thyroid surgery    . Breast lumpectomy    . Abdominal hysterectomy    . Biceps tendon repair     . Refractive surgery    . Right nares  09/2011    basal cell surgery  . Cholecystectomy    . Vesicovaginal fistula closure w/ tah    . Lasik     Family History  Problem Relation Age of Onset  . Lymphoma Mother   . Diabetic kidney disease Mother   . Diabetic kidney disease Brother   . Heart disease Sister   . Cancer Sister   . Heart attack Brother   . Myasthenia gravis Brother    History  Substance Use Topics  . Smoking status: Former Smoker    Quit date: 12/25/1983  . Smokeless tobacco: Never Used  . Alcohol Use: No   OB History   Grav Para Term Preterm Abortions TAB SAB Ect Mult Living                 Review of Systems  Unable to perform ROS: Mental status change  Gastrointestinal: Positive for vomiting.  Psychiatric/Behavioral: Positive for confusion.      Allergies  Epinephrine; Hydromorphone; Morphine and related; Phenergan; Succinylcholine; and Latex  Home Medications   Prior to Admission medications   Medication Sig Start Date End Date  Taking? Authorizing Provider  amitriptyline (ELAVIL) 10 MG tablet TAKE 1 TABLET (10 MG TOTAL) BY MOUTH AT BEDTIME. 09/08/13   Hulen Luster, DO  amitriptyline (ELAVIL) 10 MG tablet Take 1.5 tablets (15 mg total) by mouth at bedtime. 09/17/13   Hulen Luster, DO  amLODipine (NORVASC) 5 MG tablet Take 1 tablet (5 mg total) by mouth daily. 01/19/11 01/19/12  Burtis Junes, NP  aspirin 325 MG tablet Take 325 mg by mouth 2 (two) times daily.     Historical Provider, MD  Cholecalciferol (VITAMIN D-3 PO) Take by mouth daily.      Historical Provider, MD  clonazePAM (KLONOPIN) 2 MG tablet Take 1 tablet (2 mg total) by mouth 3 (three) times daily as needed. 09/01/13   Bobby Rumpf York, PA-C  Cyanocobalamin (B-12 PO) Take 1 tablet by mouth daily.    Historical Provider, MD  FLUoxetine (PROZAC) 40 MG capsule Take 80 mg by mouth daily.     Historical Provider, MD  furosemide (LASIX) 40 MG tablet Take 40 mg by mouth every other day.      Historical Provider, MD  gabapentin (NEURONTIN) 300 MG capsule Take 600 mg by mouth 3 (three) times daily.    Historical Provider, MD  glucosamine-chondroitin 500-400 MG tablet Take 1 tablet by mouth 2 (two) times daily.      Historical Provider, MD  insulin detemir (LEVEMIR) 100 UNIT/ML injection Inject 0.3 mLs (30 Units total) into the skin 2 (two) times daily. 09/01/13   Bobby Rumpf York, PA-C  insulin glargine (LANTUS) 100 UNIT/ML injection Inject 100 Units into the skin at bedtime.    Historical Provider, MD  metFORMIN (GLUCOPHAGE) 1000 MG tablet Take 1,000 mg by mouth 2 (two) times daily with a meal.    Historical Provider, MD  nitroGLYCERIN (NITROSTAT) 0.4 MG SL tablet Place 0.4 mg under the tongue every 5 (five) minutes as needed for chest pain.    Historical Provider, MD  oxyCODONE-acetaminophen (PERCOCET) 10-325 MG per tablet Take 1 tablet by mouth every 6 (six) hours as needed for pain.    Historical Provider, MD  potassium chloride (KLOR-CON) 20 MEQ packet Take 20 mEq by mouth 2 (two) times daily.    Historical Provider, MD  pravastatin (PRAVACHOL) 40 MG tablet Take 1 tablet (40 mg total) by mouth daily. 09/01/13   Bobby Rumpf York, PA-C   BP 184/96  Pulse 80  SpO2 100% BP 204/77  Pulse 92  Temp(Src) 100.4 F (38 C) (Rectal)  Resp 24  Wt 190 lb 0.6 oz (86.2 kg)  SpO2 100%  Physical Exam CONSTITUTIONAL: elderly, frail, she appears confused HEAD: Normocephalic/atraumatic EYES: EOMI/PERRL ENMT: Mucous membranes moist NECK: supple no meningeal signs Spine - no signs of trauma or bruising to spine CV: S1/S2 noted, no murmurs/rubs/gallops noted LUNGS: Lungs are clear to auscultation bilaterally, no apparent distress ABDOMEN: soft, nontender, no rebound or guarding GU:no cva tenderness NEURO: Pt is awake/alert, she is nonverbal.  She moves all extremities x4 spontaneously.  She does not follow commands EXTREMITIES: pulses normal, full ROM, no signs of trauma noted SKIN: warm,  color normal PSYCH: unable to assess  ED Course  LUMBAR PUNCTURE Date/Time: 12/04/2013 2:44 PM Performed by: Sharyon Cable Authorized by: Sharyon Cable Consent: written consent obtained. Risks and benefits: risks, benefits and alternatives were discussed Consent given by: spouse Patient identity confirmed: arm band and provided demographic data Time out: Immediately prior to procedure a "time out" was called to verify the correct  patient, procedure, equipment, support staff and site/side marked as required. Indications: evaluation for infection and evaluation for altered mental status Anesthesia: local infiltration Local anesthetic: lidocaine 1% without epinephrine Anesthetic total: 3 ml Patient sedated: yes Sedation type: anxiolysis Sedatives: lorazepam Preparation: Patient was prepped and draped in the usual sterile fashion. Lumbar space: L3-L4 interspace Patient's position: left lateral decubitus Needle gauge: 20 Number of attempts: 3 Fluid appearance: clear Tubes of fluid: 4 Total volume: 8 ml Post-procedure: site cleaned and adhesive bandage applied Patient tolerance: Patient tolerated the procedure well with no immediate complications. Comments: Pt did sustain skin tear to right tibia surface while she was lying in bed.  Bleeding controlled       12:49 PM tPA in stroke considered but not given due to:  Onset over 3-4.5hours  2:16 PM Pt febrile with AMS No clear source of fever Due to confusion/fever will perform LP to r/o meningitis Family has given written consent for LP IV antibiotics have been ordered 2:45 PM Pt tolerated lumbar puncture well Will admit 3:24 PM D/w family medicine, will admit to stepdown Pt stable currently BP 204/77  Pulse 92  Temp(Src) 100.4 F (38 C) (Rectal)  Resp 24  Wt 190 lb 0.6 oz (86.2 kg)  SpO2 100%   Labs Review Labs Reviewed  CBC - Abnormal; Notable for the following:    RBC 5.28 (*)    Hemoglobin 16.9  (*)    HCT 49.2 (*)    All other components within normal limits  DIFFERENTIAL - Abnormal; Notable for the following:    Neutrophils Relative % 84 (*)    Neutro Abs 8.1 (*)    All other components within normal limits  I-STAT CG4 LACTIC ACID, ED - Abnormal; Notable for the following:    Lactic Acid, Venous 4.91 (*)    All other components within normal limits  URINE CULTURE  CULTURE, BLOOD (ROUTINE X 2)  CULTURE, BLOOD (ROUTINE X 2)  ETHANOL  PROTIME-INR  APTT  COMPREHENSIVE METABOLIC PANEL  URINE RAPID DRUG SCREEN (HOSP PERFORMED)  URINALYSIS, ROUTINE W REFLEX MICROSCOPIC  I-STAT TROPOININ, ED    Imaging Review Ct Head Wo Contrast  12/04/2013   CLINICAL DATA:  Altered mental status ; history of previous CVA and diabetes ; the patient was unable to remain motionless on scanner.  EXAM: CT HEAD WITHOUT CONTRAST  TECHNIQUE: Contiguous axial images were obtained from the base of the skull through the vertex without intravenous contrast.  COMPARISON:  CT HEAD W/O CM dated 08/30/2013  FINDINGS: There is mild age appropriate diffuse cerebral and cerebellar atrophy with compensatory ventriculomegaly. These findings are stable. There is no evidence of an acute intracranial hemorrhage nor of an evolving ischemic infarction. There is an old lacunar infarction in the periphery of the right basal ganglia. The cerebellum and brainstem are normal in density.  At bone window settings the observed portions of the paranasal sinuses and mastoid air cells are clear. No acute skull fracture is demonstrated.  IMPRESSION: There is no acute intracranial abnormality. There are mild age related atrophic changes and changes of chronic small vessel ischemia.   Electronically Signed   By: David  Martinique   On: 12/04/2013 13:35   Dg Chest Portable 1 View  12/04/2013   CLINICAL DATA:  ALTERED MENTAL STATUS EMESIS HYPERTENSION  EXAM: PORTABLE CHEST - 1 VIEW  COMPARISON:  DG CHEST 1V PORT dated 08/30/2013; DG CHEST 1V PORT  dated 12/12/2010; CT ANGIO CHEST W/CM &/OR WO/CM dated 10/05/2010  FINDINGS: The cardiac silhouette is enlarged. Lungs are clear. Mild degenerative changes within the shoulders and postsurgical changes right shoulder. Atherosclerotic calcifications appreciated in the aortic arch.  IMPRESSION: Stable cardiomegaly no acute cardiopulmonary disease.   Electronically Signed   By: Margaree Mackintosh M.D.   On: 12/04/2013 12:44     EKG Interpretation   Date/Time:  Friday December 04 2013 12:15:00 EDT Ventricular Rate:  82 PR Interval:  182 QRS Duration: 89 QT Interval:  426 QTC Calculation: 498 R Axis:   16 Text Interpretation:  Sinus rhythm Nonspecific repol abnormality, diffuse  leads Confirmed by Christy Gentles  MD, Elenore Rota ([id number]) on 12/04/2013 1:14:05 PM      MDM   Final diagnoses:  Fever  Altered mental status  Hyperglycemia    Nursing notes including past medical history and social history reviewed and considered in documentation xrays reviewed and considered Labs/vital reviewed and considered Previous records reviewed and considered - previous admission for AMS     Sharyon Cable, MD 12/04/13 1528

## 2013-12-04 NOTE — Progress Notes (Addendum)
ANTIBIOTIC CONSULT NOTE - INITIAL  Pharmacy Consult for vancomycin, ampicillin, acyclovir Indication: r/o meningitis  Allergies  Allergen Reactions  . Epinephrine     Tachycardia when used as a local anesthetic  . Hydromorphone     UNKNOWN  . Morphine And Related     UNKNOWN  . Phenergan [Promethazine Hcl]     " GOES WACK YOU WITH THIS MEDICATION" STATEMENT MADE BY PATIENT  . Succinylcholine   . Latex Rash    Patient Measurements: Height: 5' 6.93" (170 cm) Weight: 190 lb 0.6 oz (86.2 kg) IBW/kg (Calculated) : 61.44 Adjusted Body Weight:   Vital Signs: Temp: 101.7 F (38.7 C) (04/17 1702) Temp src: Tympanic (04/17 1702) BP: 141/68 mmHg (04/17 1702) Pulse Rate: 97 (04/17 1702) Intake/Output from previous day:   Intake/Output from this shift: Total I/O In: 2000 [I.V.:2000] Out: 50 [Urine:50]  Labs:  Recent Labs  12/04/13 1227  WBC 9.7  HGB 16.9*  PLT 340  CREATININE 0.49*   Estimated Creatinine Clearance: 71.5 ml/min (by C-G formula based on Cr of 0.49). No results found for this basename: VANCOTROUGH, VANCOPEAK, VANCORANDOM, [place], [place], [place], [place], TOBRAPEAK, TOBRARND, AMIKACINPEAK, AMIKACINTROU, AMIKACIN,  in the last 72 hours   Microbiology: Recent Results (from the past 720 hour(s))  GRAM STAIN     Status: None   Collection Time    12/04/13  2:41 PM      Result Value Ref Range Status   Specimen Description CSF   Final   Special Requests 1.0ML CSF FLUID   Final   Gram Stain     Final   Value: NO WBC SEEN     NO ORGANISMS SEEN     CYTOSPIN SLIDE   Report Status 12/04/2013 FINAL   Final    Medical History: Past Medical History  Diagnosis Date  . Diabetes mellitus     Insulin dependent  . Chest pain, non-cardiac     History of 2 normal cardiac catheterizations  . Bradycardia   . Dizziness     Chronic  . Fibromyalgia   . Neuropathy   . Chronic chest pain   . Hyperlipidemia   . Chronic neck pain   . Chronic back pain    . Basal cell carcinoma of nose   . Depression   . Thyroid cyst   . Goiter   . 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 Hypertension   . Depression   . Anxiety   . Migraine     Medications:  Anti-infectives   Start     Dose/Rate Route Frequency Ordered Stop   12/05/13 0300  vancomycin (VANCOCIN) IVPB 1000 mg/200 mL premix     1,000 mg 200 mL/hr over 60 Minutes Intravenous Every 12 hours 12/04/13 1704     12/05/13 0200  cefTRIAXone (ROCEPHIN) 2 g in dextrose 5 % 50 mL IVPB     2 g 100 mL/hr over 30 Minutes Intravenous Every 12 hours 12/04/13 1653     12/04/13 1800  ampicillin (OMNIPEN) 2 g in sodium chloride 0.9 % 50 mL IVPB     2 g 150 mL/hr over 20 Minutes Intravenous Every 4 hours 12/04/13 1700     12/04/13 1800  acyclovir (ZOVIRAX) 850 mg in dextrose 5 % 150 mL IVPB     850 mg 167 mL/hr over 60 Minutes Intravenous Every 8 hours 12/04/13 1704     12/04/13 1430  vancomycin (VANCOCIN) 1,250 mg in sodium chloride 0.9 % 250 mL IVPB  1,250 mg 166.7 mL/hr over 90 Minutes Intravenous  Once 12/04/13 1426 12/04/13 1619   12/04/13 1345  vancomycin (VANCOCIN) 1,293 mg in sodium chloride 0.9 % 500 mL IVPB  Status:  Discontinued     15 mg/kg  86.2 kg 250 mL/hr over 120 Minutes Intravenous  Once 12/04/13 1343 12/04/13 1644   12/04/13 1345  cefTRIAXone (ROCEPHIN) 2 g in dextrose 5 % 50 mL IVPB  Status:  Discontinued     2 g 100 mL/hr over 30 Minutes Intravenous Every 24 hours 12/04/13 1343 12/04/13 1653     Assessment: 62 yof presented to the ED with AMS, vomiting and HTN. Tmax is 102.4 and WBC is WNL. CSF fluid only showed an elevated glucose and RBC. Tox screen is negative. To start empiric vancomycin, ceftriaxone, ampicillin and acyclovir for possible meningitis. She has received first dose of vanc + ceftriaxone so far.   Vanc 4/17>> CTX 4/17>> Ampicillin 4/17>> Acyclovir 4/17>>  Goal of Therapy:  Vancomycin trough level 15-20 mcg/ml  Plan:  1. Vanc 1gm IV Q12H 2. Change CTX to  2gm IV Q12H 3. Ampicillin 2gm IV Q4H 4. Acyclovir 850mg  IV Q8H (~10mg /kg/dose) 5. F/u renal fxn, C&S, clinical status and trough at [place] 12/04/2013,5:18 PM  Addendum:  Asked to start Zosyn - Ampicillin, ceftriaxone and ampicillin have been stopped. Will give Zosyn 3.375g IV q8h (infuse over 4 hours) Heide Guile PharmD [phone]

## 2013-12-04 NOTE — H&P (Signed)
[facility] Admission History and Physical Service Pager: [phone]  Patient name: Margaret Fischer Medical record number: [id number] Date of birth: [birth year] Age: 73 y.o. Gender: female  Primary Care Provider: Delia Chimes, NP Consultants: none Code Status: full, per prior admission as patient with AMS, will need to confirm  Chief Complaint: AMS  Assessment and Plan: Margaret Fischer is a 73 y.o. female presenting with acute encephalopathy. PMH is significant for DM, HTN, depression, prior acute encephalopathy.  # Acute encephalopathy: differential is wide in a 73 yo. Consider infectious causes with patient meeting SIRS criteria with fever and tachypnea (though infectious work-up thus far is negative) vs medication adverse effect (patient is on amitriptyline and klonipin at home as well as gabapentin) vs intoxication (UDS negative and negative alcohol) vs DKA (glucose only 264, though with ketones in urine and urine glucose >1000, patient with gap of 22, though no low bicarb) vs CVA (negative CT head, and with fever this is likely an infectious issue), lactic acidosis noted -will admit to step down, Attending Dr Mingo Amber -will d/c ampicillin, rocephin, and acyclovir given CSF studies unremarkable for infection -will continue vanc and transition to zosyn for likely sepsis -will hold home sedating medications until improved mental status -f/u BCx and UCx -will check serum ketones to rule out DKA -if infectious work up in negative will consider CVA eval -will check repeat lactic acid to ensure down trend in am  # DM insulin dependent: daughter is unsure what type of insulin the patient is on at home and there appear to be 2 types listed, though levemir appears to have been filled recently in pharmacy review. -will start back home levemir -SSI and q4 cbgs -will check serum ketones and repeat BMET now to look for worsening gap  # HTN: elevated in ED. -will continue to  monitor -will allow permissive elevation at this time given CVA in differential -hydralazine 5 mg q6 prn BP > 190/110  # Hypokalemia: 3.4 on admission -repleat with KCl 10 mEq x4 runs IV  FEN/GI: NPO, 1/2 NS 125/hr Prophylaxis: heparin SQ  Disposition: admit to step down, discharge pending improvement in mental status and completion of work-up  History of Present Illness: Margaret Fischer is a 73 y.o. female presenting with altered mental status. Level V caveat applies as patient non-verbal during exam. History obtained from ED records, speaking to the daughter on the phone, and speaking to the son.  Patient lives with daughter and was noted to be confused and combative on waking this morning. Prior to this she was known to be acting normally, other than some increased anxiety yesterday. The daughter notes the patient with vomiting yesterday reported to her by her roommate. No reported falls or overdose. They report a similar episode following a UTI in January.  In the ED she was noted to be confused with fever to 100.4. Her UA was unremarkable for infection, though did have elevated glucose and ketones. CXR was unremarkable for source on infection. She had a CT head that did not reveal any acute abnormalities. An LP was performed in the ED, and the results are unremarkable for infection at this time. She was initially started on treatment for potential meningitis.  Review Of Systems: Per HPI with the following additions: none given non-verbal patient. Otherwise 12 point review of systems was performed and was unremarkable.  Patient Active Problem List   Diagnosis Date Noted  . Altered mental status 12/04/2013  . Diabetes mellitus 08/31/2013  .  Fibromyalgia 08/31/2013  . Acute encephalopathy 08/31/2013  . Sepsis 08/31/2013  . Altered mental state 08/30/2013  . Chest pain 01/08/2011  . HTN (hypertension) 01/08/2011  . Depressed 01/08/2011   Past Medical History: Past Medical History   Diagnosis Date  . Diabetes mellitus     Insulin dependent  . Chest pain, non-cardiac     History of 2 normal cardiac catheterizations  . Bradycardia   . Dizziness     Chronic  . Fibromyalgia   . Neuropathy   . Chronic chest pain   . Hyperlipidemia   . Chronic neck pain   . Chronic back pain   . Basal cell carcinoma of nose   . Depression   . Thyroid cyst   . Goiter   . 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 Hypertension   . Depression   . Anxiety   . Migraine    Past Surgical History: Past Surgical History  Procedure Laterality Date  . Total knee arthroplasty    . Cardiac catheterization      History of 2 caths, reportedly normal  . Joint replacement    . Rotator cuff repair    . Appendectomy    . Cystectomy    . Thyroid surgery    . Breast lumpectomy    . Abdominal hysterectomy    . Biceps tendon repair    . Refractive surgery    . Right nares  09/2011    basal cell surgery  . Cholecystectomy    . Vesicovaginal fistula closure w/ tah    . Lasik     Social History: History  Substance Use Topics  . Smoking status: Former Smoker    Quit date: 12/25/1983  . Smokeless tobacco: Never Used  . Alcohol Use: No   Additional social history: none  Please also refer to relevant sections of EMR.  Family History: Family History  Problem Relation Age of Onset  . Lymphoma Mother   . Diabetic kidney disease Mother   . Diabetic kidney disease Brother   . Heart disease Sister   . Cancer Sister   . Heart attack Brother   . Myasthenia gravis Brother    Allergies and Medications: Allergies  Allergen Reactions  . Epinephrine     Tachycardia when used as a local anesthetic  . Hydromorphone     UNKNOWN  . Morphine And Related     UNKNOWN  . Phenergan [Promethazine Hcl]     " GOES WACK YOU WITH THIS MEDICATION" STATEMENT MADE BY PATIENT  . Succinylcholine   . Latex Rash   No current facility-administered medications on file prior to encounter.   Current Outpatient  Prescriptions on File Prior to Encounter  Medication Sig Dispense Refill  . amitriptyline (ELAVIL) 10 MG tablet TAKE 1 TABLET (10 MG TOTAL) BY MOUTH AT BEDTIME.  30 tablet  0  . amitriptyline (ELAVIL) 10 MG tablet Take 1.5 tablets (15 mg total) by mouth at bedtime.  90 tablet  6  . amLODipine (NORVASC) 5 MG tablet Take 1 tablet (5 mg total) by mouth daily.  30 tablet  11  . aspirin 325 MG tablet Take 325 mg by mouth 2 (two) times daily.       . Cholecalciferol (VITAMIN D-3 PO) Take by mouth daily.        . clonazePAM (KLONOPIN) 2 MG tablet Take 1 tablet (2 mg total) by mouth 3 (three) times daily as needed.  30 tablet  0  .  Cyanocobalamin (B-12 PO) Take 1 tablet by mouth daily.      Marland Kitchen FLUoxetine (PROZAC) 40 MG capsule Take 80 mg by mouth daily.       . furosemide (LASIX) 40 MG tablet Take 40 mg by mouth every other day.       . gabapentin (NEURONTIN) 300 MG capsule Take 600 mg by mouth 3 (three) times daily.      Marland Kitchen glucosamine-chondroitin 500-400 MG tablet Take 1 tablet by mouth 2 (two) times daily.        . insulin detemir (LEVEMIR) 100 UNIT/ML injection Inject 0.3 mLs (30 Units total) into the skin 2 (two) times daily.  10 mL  11  . insulin glargine (LANTUS) 100 UNIT/ML injection Inject 100 Units into the skin at bedtime.      . metFORMIN (GLUCOPHAGE) 1000 MG tablet Take 1,000 mg by mouth 2 (two) times daily with a meal.      . nitroGLYCERIN (NITROSTAT) 0.4 MG SL tablet Place 0.4 mg under the tongue every 5 (five) minutes as needed for chest pain.      Marland Kitchen oxyCODONE-acetaminophen (PERCOCET) 10-325 MG per tablet Take 1 tablet by mouth every 6 (six) hours as needed for pain.      . potassium chloride (KLOR-CON) 20 MEQ packet Take 20 mEq by mouth 2 (two) times daily.      . pravastatin (PRAVACHOL) 40 MG tablet Take 1 tablet (40 mg total) by mouth daily.  30 tablet  3    Objective: BP 183/70  Pulse 92  Temp(Src) 99.8 F (37.7 C) (Axillary)  Resp 26  Ht 5' 6.93" (1.7 m)  Wt 182 lb 8.7 oz  (82.8 kg)  BMI 28.65 kg/m2  SpO2 99% Exam: General: NAD, not arousable HEENT: NCAT, PERRL, would not open mouth Cardiovascular: rrr, no murmurs appreciated Respiratory: CTAB, no wheezes or crackles Abdomen: s, not apparently tender, ND, no masses palapted Extremities: no edema, 2+ DP pulses Skin: no lesions noted on sacrum or buttocks Neuro: not arousable  Labs and Imaging: CBC    Recent Labs Lab 12/04/13 1227  WBC 9.7  HGB 16.9*  HCT 49.2*  PLT 340      CMP     Component Value Date/Time   NA 140 12/04/2013 1227   K 3.4* 12/04/2013 1227   CL 96 12/04/2013 1227   CO2 22 12/04/2013 1227   GLUCOSE 234* 12/04/2013 1227   BUN 13 12/04/2013 1227   CREATININE 0.49* 12/04/2013 1227   CALCIUM 9.6 12/04/2013 1227   PROT 7.4 12/04/2013 1227   ALBUMIN 4.3 12/04/2013 1227   AST 26 12/04/2013 1227   ALT 13 12/04/2013 1227   ALKPHOS 118* 12/04/2013 1227   BILITOT 1.2 12/04/2013 1227   GFRNONAA >90 12/04/2013 1227   GFRAA >90 12/04/2013 1227    UA: >1000 glucose, >80 ketones, 100 protein, no leuks or nitrites iStat trop negative iStat lactic acid 4.91 Ethanol neg UDS neg INR 0.97 CSF colorless, clear, RBC 595, WBC 1, few seg neutrophils, few lymphs, rare monos, gram stain no WBC or organisms, glucose 116, protein 35  Ct Head Wo Contrast  12/04/2013    IMPRESSION: There is no acute intracranial abnormality. There are mild age related atrophic changes and changes of chronic small vessel ischemia.     Dg Chest Portable 1 View  12/04/2013    IMPRESSION: Stable cardiomegaly no acute cardiopulmonary disease.     Leone Haven, MD 12/04/2013, 6:18 PM PGY-2, [place] Intern  pager: [phone], text pages welcome

## 2013-12-04 NOTE — ED Notes (Signed)
Family at bedside. 

## 2013-12-04 NOTE — ED Notes (Addendum)
Pt is more alert at this time but remains nonverbal. She is unable to follow commands but is calm and cooperative pupils 3-55mm and reactive.

## 2013-12-04 NOTE — ED Notes (Signed)
Paging admitting regarding patients temperature and bed status

## 2013-12-04 NOTE — Progress Notes (Signed)
Family Practice Teaching Service Interval Progress Note  Went to evaluate patient after she came to step down. The patients husband is in the room with the patient and states she is more active than previously. She is moving all of her extremities in the bed and is opening her eyes more. Discussed the patient with the nurse and she states they are going to have a sitter come to the room. We will check a BMET and serum ketones. Patient has yet to receive her insulin. This is to come soon from pharmacy. Will continue to monitor the patient.  Tommi Rumps, MD Family Medicine PGY-2 Service Pager [phone]

## 2013-12-04 NOTE — ED Notes (Signed)
Family at bedside.  Consent for LP obtained by family and explained by Dr. Christy Gentles

## 2013-12-05 ENCOUNTER — Inpatient Hospital Stay (HOSPITAL_COMMUNITY): Payer: Medicare Other

## 2013-12-05 DIAGNOSIS — E119 Type 2 diabetes mellitus without complications: Secondary | ICD-10-CM

## 2013-12-05 DIAGNOSIS — R509 Fever, unspecified: Secondary | ICD-10-CM

## 2013-12-05 DIAGNOSIS — I10 Essential (primary) hypertension: Secondary | ICD-10-CM

## 2013-12-05 DIAGNOSIS — A419 Sepsis, unspecified organism: Secondary | ICD-10-CM

## 2013-12-05 DIAGNOSIS — G9340 Encephalopathy, unspecified: Principal | ICD-10-CM

## 2013-12-05 DIAGNOSIS — IMO0002 Reserved for concepts with insufficient information to code with codable children: Secondary | ICD-10-CM

## 2013-12-05 LAB — CBC
HCT: 42.4 % (ref 36.0–46.0)
Hemoglobin: 14.5 g/dL (ref 12.0–15.0)
MCH: 31.9 pg (ref 26.0–34.0)
MCHC: 34.2 g/dL (ref 30.0–36.0)
MCV: 93.2 fL (ref 78.0–100.0)
Platelets: 291 10*3/uL (ref 150–400)
RBC: 4.55 MIL/uL (ref 3.87–5.11)
RDW: 12.7 % (ref 11.5–15.5)
WBC: 12.2 10*3/uL — ABNORMAL HIGH (ref 4.0–10.5)

## 2013-12-05 LAB — URINE CULTURE
COLONY COUNT: NO GROWTH
Culture: NO GROWTH

## 2013-12-05 LAB — BASIC METABOLIC PANEL
BUN: 10 mg/dL (ref 6–23)
CHLORIDE: 108 meq/L (ref 96–112)
CO2: 19 mEq/L (ref 19–32)
CREATININE: 0.44 mg/dL — AB (ref 0.50–1.10)
Calcium: 8.2 mg/dL — ABNORMAL LOW (ref 8.4–10.5)
GFR calc Af Amer: >90 mL/min (ref >90)
GFR calc non Af Amer: >90 mL/min (ref >90)
Glucose, Bld: 132 mg/dL — ABNORMAL HIGH (ref 70–99)
Potassium: 3.1 mEq/L — ABNORMAL LOW (ref 3.7–5.3)
Sodium: 142 mEq/L (ref 137–147)

## 2013-12-05 LAB — BLOOD GAS, ARTERIAL
Acid-base deficit: 1.1 mmol/L (ref 0.0–2.0)
Bicarbonate: 23.1 mEq/L (ref 20.0–24.0)
DRAWN BY: 10552
O2 CONTENT: 2 L/min
O2 Saturation: 99.1 %
PCO2 ART: 37.9 mmHg (ref 35.0–45.0)
PH ART: 7.4 (ref 7.350–7.450)
Patient temperature: 98.3
TCO2: 24.2 mmol/L (ref 0–100)
pO2, Arterial: 91.9 mmHg (ref 80.0–100.0)

## 2013-12-05 LAB — GLUCOSE, CAPILLARY
GLUCOSE-CAPILLARY: 138 mg/dL — AB (ref 70–99)
GLUCOSE-CAPILLARY: 208 mg/dL — AB (ref 70–99)
Glucose-Capillary: 121 mg/dL — ABNORMAL HIGH (ref 70–99)
Glucose-Capillary: 122 mg/dL — ABNORMAL HIGH (ref 70–99)
Glucose-Capillary: 142 mg/dL — ABNORMAL HIGH (ref 70–99)
Glucose-Capillary: 181 mg/dL — ABNORMAL HIGH (ref 70–99)

## 2013-12-05 LAB — LACTIC ACID, PLASMA: LACTIC ACID, VENOUS: 1.7 mmol/L (ref 0.5–2.2)

## 2013-12-05 MED ORDER — FLUCONAZOLE IN SODIUM CHLORIDE 200-0.9 MG/100ML-% IV SOLN
200.0000 mg | Freq: Once | INTRAVENOUS | Status: AC
  Administered 2013-12-05: 200 mg via INTRAVENOUS
  Filled 2013-12-05: qty 100

## 2013-12-05 MED ORDER — ZIPRASIDONE MESYLATE 20 MG IM SOLR
10.0000 mg | Freq: Once | INTRAMUSCULAR | Status: AC
  Administered 2013-12-05: 10 mg via INTRAMUSCULAR
  Filled 2013-12-05: qty 20

## 2013-12-05 MED ORDER — ZIPRASIDONE MESYLATE 20 MG IM SOLR: 20.0000 mg | Freq: Once | INTRAMUSCULAR | Status: DC

## 2013-12-05 MED ORDER — POTASSIUM CHLORIDE 10 MEQ/100ML IV SOLN
10.0000 meq | INTRAVENOUS | Status: AC
  Administered 2013-12-05 (×4): 10 meq via INTRAVENOUS
  Filled 2013-12-05 (×4): qty 100

## 2013-12-05 MED ORDER — SODIUM CHLORIDE 0.9 % IV BOLUS (SEPSIS)
1000.0000 mL | Freq: Once | INTRAVENOUS | Status: AC
  Administered 2013-12-05: 1000 mL via INTRAVENOUS

## 2013-12-05 MED ORDER — POTASSIUM CHLORIDE 10 MEQ/100ML IV SOLN: 10.0000 meq | INTRAVENOUS | Status: DC

## 2013-12-05 MED ORDER — ONDANSETRON HCL 4 MG/2ML IJ SOLN
4.0000 mg | Freq: Once | INTRAMUSCULAR | Status: DC
  Administered 2013-12-05: 4 mg via INTRAVENOUS
  Filled 2013-12-05: qty 2

## 2013-12-05 MED ORDER — FLUCONAZOLE 100MG IVPB
100.0000 mg | INTRAVENOUS | Status: DC
  Administered 2013-12-06: 100 mg via INTRAVENOUS
  Filled 2013-12-05 (×2): qty 50

## 2013-12-05 MED ORDER — ACETAMINOPHEN 650 MG RE SUPP
650.0000 mg | RECTAL | Status: DC | PRN
  Administered 2013-12-05 – 2013-12-06 (×2): 650 mg via RECTAL
  Filled 2013-12-05 (×2): qty 1

## 2013-12-05 NOTE — Progress Notes (Signed)
Family Practice Teaching Service  Interval Progress Note  S: went to evaluate patient in f/u. Nursing reports patient continuing to move about the bed. She has now verbalized the desire to urinate. She is able to more readily respond to her name and focus on my face while I am talking to her.  O: BP 119/43  Pulse 95  Temp(Src) 99.5 F (37.5 C) (Oral)  Resp 28  Ht 5' 6.93" (1.7 m)  Wt 182 lb 8.7 oz (82.8 kg)  BMI 28.65 kg/m2  SpO2 99% GEN: NAD, moving about the bed, able to answer yes/no questions, no respiratory distress  HEENT: PERRL, sticks tongue out on command Abd: soft, apparently nontender, ND, no guarding or rebound, no firmness palpated   A/P: Margaret Fischer is a 73 y.o. female presenting with acute encephalopathy. PMH is significant for DM, HTN, depression, prior acute encephalopathy.  -continues to have agitation likely in part related to the amount of ativan she has received in the past 12 hours -will place a foley as she has been urinating on the bed 3x and verbalizes a need to urinate -will attempt one wrist restraint on the right wrist -will not give any additional ativan, will consider haldol if continues to be agitated -await lactic acid -continue to monitor   Tommi Rumps, MD  [facility] Medicine PGY-2  Service Pager [phone]

## 2013-12-05 NOTE — Progress Notes (Signed)
Pt has become agitated again -- Notified Dr. Thomes Dinning who states she'll consult with her attending to discuss plan of care. Kirby Crigler [employer]

## 2013-12-05 NOTE — Progress Notes (Addendum)
Family Practice Teaching Service Interval Progress Note  Went to check on patient as she has been agitated most of the night. Upon arrival the patient is asleep. She has been taken off O2 and while asleep her O2 sat goes to 88%, but no lower. She was placed back on O2 and while this was occuring she opened her eyes and then went back to sleep. Her Vitals appear stable and she appears to be sleeping comfortably and is arrousable. Will await further labs this morning and continue to monitor for agitation. Will not give any more ativan and haldol does not appear to be a great option with qtc of close to 500. Will continue behavioral interventions. Nursing to call about getting labs drawn, will continue to f/u on this.  Tommi Rumps, MD Family Medicine PGY-2 Service Pager [phone]

## 2013-12-05 NOTE — Progress Notes (Signed)
FMTS Attending Daily Note:  Jeff Mckena Chern MD  [phone] pager  Family Practice pager:  [phone] I have seen and examined this patient and have reviewed their chart. I have discussed this patient with the resident. I agree with the resident's findings, assessment and care plan.  Additionally:  See separate admit note for details.   Logen Fowle H Jenetta Wease, MD 12/05/2013 1:10 PM    

## 2013-12-05 NOTE — Progress Notes (Signed)
Dr. Thomes Dinning notified that pt is extremely agitated, thrashing her arns and legs, coughing/gaging constantly, and that family reports a history of bad reactions to Haldol (confusion, agitation, etc). Dr. Thomes Dinning states she will round on patient soon. Kirby Crigler [employer]

## 2013-12-05 NOTE — Progress Notes (Signed)
Family Medicine Teaching Service Daily Progress Note Intern Pager: [phone]  Patient name: Margaret Fischer Medical record number: [id number] Date of birth: 1941/08/10 Age: 73 y.o. Gender: female  Primary Care Provider: Delia Chimes, NP Consultants: none Code Status: full, per prior admission.  Pt Overview and Major Events to Date:  4/17: sitter in room and pt with soft restraints for agitation.   Assessment and Plan: Margaret Fischer is a 73 y.o. female presenting with acute encephalopathy. PMH is significant for DM, HTN, depression, prior acute encephalopathy.  # Acute encephalopathy: infectious work up including UA,chest xray and CFS are so far negative (WBC is trending down, no fever since yesterday 5:45pm, no more tachycardia) unknown source. -will continue empiric vanc and zosyn  -f/u BCx and UCx  Other possible etiology included DKA. Small blood ketones and labs this am is 15, improved from yesterday (19) pt is currently NPO and CBG's are 120's range. This make this diagnosis less likely  Possibility of CVA is less likely with negative CT and CSF results  Today nursing was able to speak with pt's sister who mentioned that pt has been in pain management and has had "issues" in the past. UDS is negative as well as ETOH. Withdrawal is a possibility. -today ativan was not working, and Haldol was a less of a consideration due to past side effects on pt and QT. We discussed with attending and proceed with Varnado - if this is insufficient to control pt agitation, more invasive sedation may be neccessary.    # DM insulin dependent: daughter is unsure what type of insulin the patient is on at home and there appear to be 2 types listed, though levemir appears to have been filled recently in pharmacy review.  -will start back home levemir  -SSI and q4 cbgs   # HTN: -will continue to monitor   # Hypokalemia: -repleat with KCl 10 mEq x3 runs IV   # oral thrush:  -fluconazole loading  dose of 200mg  today and then daily 100mg  for 7-10 days.   FEN/GI: NPO, 1/2 NS 125/hr  Prophylaxis: heparin SQ   Disposition: continue on step down, discharge pending improvement in mental status and completion of work-up   Subjective:  Still agitated. Sitter in room.   Objective: Temp:  [98.6 F (37 C)-102.4 F (39.1 C)] 99.4 F (37.4 C) (04/18 0811) Pulse Rate:  [64-103] 75 (04/18 0815) Resp:  [16-35] 30 (04/18 0815) BP: (109-212)/(35-102) 131/54 mmHg (04/18 0815) SpO2:  [90 %-100 %] 92 % (04/18 0815) Weight:  [182 lb 8.7 oz (82.8 kg)-190 lb 0.6 oz (86.2 kg)] 182 lb 8.7 oz (82.8 kg) (04/17 1745) Physical Exam: General: agitated, combative. Able to say short sentences but unable to hold conversation.  HEENT: moist mucosa, normal color.PERRL. Mouth with thrush present. Cardiovascular: rrr, no murmurs appreciated  Respiratory: CTAB, no wheezes or crackles  Abdomen: Non distended, soft, not apparently tender, no masses palapted  Extremities: no edema, 2+ DP pulses  Skin: no lesions noted on sacrum or buttocks, multiple bruising in arms. Neuro: agitated. Moves all 4 extremities.   Laboratory:  Recent Labs Lab 12/04/13 1227 12/04/13 2225 12/05/13 0435  WBC 9.7 16.1* 12.2*  HGB 16.9* 16.0* 14.5  HCT 49.2* 45.9 42.4  PLT 340 352 291    Recent Labs Lab 12/04/13 1227 12/04/13 2225 12/05/13 0435  NA 140 137 142  K 3.4* 4.0 3.1*  CL 96 99 108  CO2 22 19 19   BUN 13 10 10  CREATININE 0.49* 0.44* 0.44*  CALCIUM 9.6 8.3* 8.2*  PROT 7.4  --   --   BILITOT 1.2  --   --   ALKPHOS 118*  --   --   ALT 13  --   --   AST 26  --   --   GLUCOSE 234* 247* 132*   Imaging/Diagnostic Tests: UA: >1000 glucose, >80 ketones, 100 protein, no leuks or nitrites  iStat trop negative  iStat lactic acid 4.91  Ethanol neg  UDS neg  INR 0.97   CSF colorless, clear, RBC 595, WBC 1, few seg neutrophils, few lymphs, rare monos, gram stain no WBC or organisms, glucose 116, protein 35    Ct Head Wo Contrast  12/04/2013 IMPRESSION: There is no acute intracranial abnormality. There are mild age related atrophic changes and changes of chronic small vessel ischemia.   Dg Chest Portable 1 View  12/04/2013 IMPRESSION: Stable cardiomegaly no acute cardiopulmonary disease.   Dayarmys Piloto de Gwendalyn Ege, MD 12/05/2013, 10:54 AM PGY-3, [place] Intern pager: [phone], text pages welcome

## 2013-12-05 NOTE — Progress Notes (Signed)
Dr. Thomes Dinning notified that pt's respiration rate is in the 30s (pt is currently sleeping); Geodon has not been given yet. Plan of care -- obtain an ABG and change Geodon to PRN. Kirby Crigler [employer]

## 2013-12-05 NOTE — H&P (Signed)
FMTS Attending Admission Note: Annabell Sabal MD Personal pager:  [phone] FPTS Service Pager:  [phone]  I  have seen and examined this patient, reviewed their chart. I have discussed this patient with the resident. I agree with the resident's findings, assessment and care plan.  Additionally:  - Broad differential in this acutely altered 73 yo F with negative CT head, essentially negative LP (though with blood from likely traumatic tap), and persistent agitation.  Quiet when I saw her at 7 AM this morning but evidently now with worsening agitation.  Also now with ketones in blood and urine glucose >1000.  Glucose improved and normal bicarb make DKA less likely.   - covering for bacterial infectiuos process.  Recent admit in Jan with similar symptoms and found to have UTI at that time.   - ativan and geodon for increased agitation.   - need to sort through outpt insulin regimen.   - continue IV antibiotics until at least 24 hours afebrile.   Alveda Reasons, MD 12/05/2013

## 2013-12-05 NOTE — Progress Notes (Signed)
Notified Dr. Thomes Dinning that I spoke extensively with pt's sister, Crickett Pegram, around 62. Sister states that pt has a history of taking many different types of pain pills and that her doctor has been trying to address that issue since January. Sister also states that pt has told her she's still obtaining pain medications, but sister is not sure where she's getting them. Kirby Crigler [employer]

## 2013-12-05 NOTE — Progress Notes (Signed)
Notified Dr. Thomes Dinning that pt is less agitated than previously. Pt now has a temp of 101.7 axillary -- Plan of care -- give rectal Tylenol. Kirby Crigler [employer]

## 2013-12-05 NOTE — Progress Notes (Signed)
Family Practice Teaching Service Interval Progress Note  S: went to evaluate patient in f/u. Patient received ativan 1 mg IV earlier and nursing reports that the patient slept with this intervention for about one hour. She now has a sitter in the room and is in a posey belt and mitts. She is still intermittently moving about the bed. She looked to me when spoken to, but only on one occasion did she verbally respond with the word yes in response to her name.  O: BP 119/43  Pulse 95  Temp(Src) 99.5 F (37.5 C) (Oral)  Resp 28  Ht 5' 6.93" (1.7 m)  Wt 182 lb 8.7 oz (82.8 kg)  BMI 28.65 kg/m2  SpO2 99% GEN: NAD, moving about the bed, non-conversant, no respiratory distress HEENT: PERRL, withdraws from my trying to open her eyes Abd: soft, apparently nontender, ND, no guarding or rebound, no firmness palpated  A/P: Margaret Fischer is a 73 y.o. female presenting with acute encephalopathy. PMH is significant for DM, HTN, depression, prior acute encephalopathy. -CBC returned with elevated WBC to 16 -BMET with gap of 19 which is improved, though her bicarb decreased to 19 -glucose has remained stable, though this was taken prior to levemir and novolog being administered -her Cr remains near baseline -at this time I will give her a NS bolus and obtain a repeat lactic acid -will continue to monitor at this time  Tommi Rumps, MD [facility] Medicine PGY-2 Service Pager [phone]

## 2013-12-05 NOTE — Progress Notes (Signed)
INITIAL NUTRITION ASSESSMENT  DOCUMENTATION CODES Per approved criteria  -Not Applicable   INTERVENTION: Diet advancement per MD Add Multivitamin with minerals once diet advanced RD to monitor PO intake for adequacy and provide further intervention as needed  NUTRITION DIAGNOSIS: Inadequate oral intake related to nausea,life stress, and decreased appetite as evidenced by 5% weight loss in 3 months.   Goal: Pt to meet >/= 90% of their estimated nutrition needs   Monitor:  Diet advancement, PO intake, weight, labs  Reason for Assessment: Malnutrition Screening Tool (MST)  73 y.o. female  Admitting Dx: <principal problem not specified>  ASSESSMENT: 73 y.o. female presenting with acute encephalopathy. PMH is significant for DM, HTN, depression, prior acute encephalopathy.   Pt asleep at time of visit. Weight history shows pt has lost 5% of her body weight in the past 3 months (not significant). Per pt's sister at bedside pt has been complaining of being sick to her stomach for the past week. Pt has also had life stress that may be contributing to weight loss per sister. Sister reports pt usually has a great appetite and eats well, she does not use nutritional supplements. Pt remains NPO at this time.   Height: Ht Readings from Last 1 Encounters:  12/04/13 5' 6.93" (1.7 m)    Weight: Wt Readings from Last 1 Encounters:  12/04/13 182 lb 8.7 oz (82.8 kg)    Ideal Body Weight: 135 lbs  % Ideal Body Weight: 135%  Wt Readings from Last 10 Encounters:  12/04/13 182 lb 8.7 oz (82.8 kg)  09/17/13 190 lb (86.183 kg)  08/31/13 191 lb 9.3 oz (86.9 kg)  01/25/11 189 lb (85.73 kg)  01/19/11 192 lb (87.091 kg)  01/08/11 188 lb 3.2 oz (85.367 kg)    Usual Body Weight: 190 lbs  % Usual Body Weight: 95%  BMI:  Body mass index is 28.65 kg/(m^2).  Estimated Nutritional Needs: Kcal: 1800-2000 Protein: 80-90 grams Fluid: 1.8-2 L/day  Skin: +2 RLE and LLE edema; skin tear on  right arm  Diet Order: NPO  EDUCATION NEEDS: -No education needs identified at this time   Intake/Output Summary (Last 24 hours) at 12/05/13 1355 Last data filed at 12/05/13 1324  Gross per 24 hour  Intake 6193.75 ml  Output    975 ml  Net 5218.75 ml    Last BM: PTA   Labs:   Recent Labs Lab 12/04/13 1227 12/04/13 2225 12/05/13 0435  NA 140 137 142  K 3.4* 4.0 3.1*  CL 96 99 108  CO2 22 19 19   BUN 13 10 10   CREATININE 0.49* 0.44* 0.44*  CALCIUM 9.6 8.3* 8.2*  GLUCOSE 234* 247* 132*    CBG (last 3)   Recent Labs  12/05/13 0445 12/05/13 0810 12/05/13 1120  GLUCAP 121* 122* 142*    Scheduled Meds: . [START ON 12/06/2013] fluconazole (DIFLUCAN) IV  100 mg Intravenous Q24H  . fluconazole (DIFLUCAN) IV  200 mg Intravenous Once  . heparin  5,000 Units Subcutaneous 3 times per day  . insulin aspart  0-15 Units Subcutaneous TID WC  . insulin aspart  0-5 Units Subcutaneous QHS  . insulin detemir  30 Units Subcutaneous BID  . piperacillin-tazobactam (ZOSYN)  IV  3.375 g Intravenous 3 times per day  . sodium chloride  3 mL Intravenous Q12H  . vancomycin  1,000 mg Intravenous Q12H    Continuous Infusions: . sodium chloride 125 mL/hr at 12/04/13 1709    Past Medical History  Diagnosis Date  .  Diabetes mellitus     Insulin dependent  . Chest pain, non-cardiac     History of 2 normal cardiac catheterizations  . Bradycardia   . Dizziness     Chronic  . Fibromyalgia   . Neuropathy   . Chronic chest pain   . Hyperlipidemia   . Chronic neck pain   . Chronic back pain   . Basal cell carcinoma of nose   . Depression   . Thyroid cyst   . Goiter   . 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 Hypertension   . Depression   . Anxiety   . Migraine     Past Surgical History  Procedure Laterality Date  . Total knee arthroplasty    . Cardiac catheterization      History of 2 caths, reportedly normal  . Joint replacement    . Rotator cuff repair    . Appendectomy     . Cystectomy    . Thyroid surgery    . Breast lumpectomy    . Abdominal hysterectomy    . Biceps tendon repair    . Refractive surgery    . Right nares  09/2011    basal cell surgery  . Cholecystectomy    . Vesicovaginal fistula closure w/ tah    . [place], LDN Inpatient Clinical Dietitian Pager: [phone] After Hours Pager: [phone]

## 2013-12-06 DIAGNOSIS — R7309 Other abnormal glucose: Secondary | ICD-10-CM

## 2013-12-06 DIAGNOSIS — R4182 Altered mental status, unspecified: Secondary | ICD-10-CM

## 2013-12-06 LAB — CBC WITH DIFFERENTIAL/PLATELET
BASOS ABS: 0 10*3/uL (ref 0.0–0.1)
Basophils Relative: 0 % (ref 0–1)
EOS PCT: 0 % (ref 0–5)
Eosinophils Absolute: 0 10*3/uL (ref 0.0–0.7)
HCT: 44.1 % (ref 36.0–46.0)
Hemoglobin: 14.9 g/dL (ref 12.0–15.0)
LYMPHS PCT: 24 % (ref 12–46)
Lymphs Abs: 2.2 10*3/uL (ref 0.7–4.0)
MCH: 32 pg (ref 26.0–34.0)
MCHC: 33.8 g/dL (ref 30.0–36.0)
MCV: 94.6 fL (ref 78.0–100.0)
Monocytes Absolute: 0.8 10*3/uL (ref 0.1–1.0)
Monocytes Relative: 9 % (ref 3–12)
NEUTROS PCT: 67 % (ref 43–77)
Neutro Abs: 6.2 10*3/uL (ref 1.7–7.7)
PLATELETS: 290 10*3/uL (ref 150–400)
RBC: 4.66 MIL/uL (ref 3.87–5.11)
RDW: 12.7 % (ref 11.5–15.5)
WBC: 9.2 10*3/uL (ref 4.0–10.5)

## 2013-12-06 LAB — GLUCOSE, CAPILLARY
GLUCOSE-CAPILLARY: 137 mg/dL — AB (ref 70–99)
GLUCOSE-CAPILLARY: 164 mg/dL — AB (ref 70–99)
Glucose-Capillary: 181 mg/dL — ABNORMAL HIGH (ref 70–99)
Glucose-Capillary: 120 mg/dL — ABNORMAL HIGH (ref 70–99)
Glucose-Capillary: 187 mg/dL — ABNORMAL HIGH (ref 70–99)
Glucose-Capillary: 239 mg/dL — ABNORMAL HIGH (ref 70–99)

## 2013-12-06 LAB — BASIC METABOLIC PANEL
BUN: 9 mg/dL (ref 6–23)
CALCIUM: 8.9 mg/dL (ref 8.4–10.5)
CO2: 22 meq/L (ref 19–32)
CREATININE: 0.5 mg/dL (ref 0.50–1.10)
Chloride: 100 mEq/L (ref 96–112)
Glucose, Bld: 140 mg/dL — ABNORMAL HIGH (ref 70–99)
Potassium: 3.1 mEq/L — ABNORMAL LOW (ref 3.7–5.3)
Sodium: 138 mEq/L (ref 137–147)

## 2013-12-06 MED ORDER — POTASSIUM CHLORIDE 10 MEQ/100ML IV SOLN
10.0000 meq | INTRAVENOUS | Status: DC
  Administered 2013-12-06: 10 meq via INTRAVENOUS
  Filled 2013-12-06: qty 100

## 2013-12-06 MED ORDER — KETOROLAC TROMETHAMINE 15 MG/ML IJ SOLN
15.0000 mg | Freq: Once | INTRAMUSCULAR | Status: AC
  Administered 2013-12-06: 15 mg via INTRAVENOUS
  Filled 2013-12-06: qty 1

## 2013-12-06 MED ORDER — ACETAMINOPHEN 325 MG PO TABS
650.0000 mg | ORAL_TABLET | Freq: Four times a day (QID) | ORAL | Status: DC | PRN
  Administered 2013-12-06 – 2013-12-08 (×4): 650 mg via ORAL
  Filled 2013-12-06 (×4): qty 2

## 2013-12-06 MED ORDER — FUROSEMIDE 40 MG PO TABS
40.0000 mg | ORAL_TABLET | Freq: Every day | ORAL | Status: DC
  Filled 2013-12-06: qty 1

## 2013-12-06 MED ORDER — NYSTATIN 100000 UNIT/ML MT SUSP
5.0000 mL | Freq: Four times a day (QID) | OROMUCOSAL | Status: DC
  Administered 2013-12-06 – 2013-12-08 (×9): 500000 [IU] via OROMUCOSAL
  Filled 2013-12-06 (×12): qty 5

## 2013-12-06 MED ORDER — POTASSIUM CHLORIDE 20 MEQ/15ML (10%) PO LIQD
40.0000 meq | Freq: Two times a day (BID) | ORAL | Status: DC
  Filled 2013-12-06 (×2): qty 30

## 2013-12-06 MED ORDER — AMLODIPINE BESYLATE 5 MG PO TABS
5.0000 mg | ORAL_TABLET | Freq: Every day | ORAL | Status: DC
  Administered 2013-12-06 – 2013-12-07 (×2): 5 mg via ORAL
  Filled 2013-12-06 (×2): qty 1

## 2013-12-06 MED ORDER — ONDANSETRON HCL 4 MG/2ML IJ SOLN
4.0000 mg | Freq: Once | INTRAMUSCULAR | Status: AC
  Administered 2013-12-06: 4 mg via INTRAVENOUS
  Filled 2013-12-06: qty 2

## 2013-12-06 MED ORDER — POTASSIUM CHLORIDE CRYS ER 20 MEQ PO TBCR
30.0000 meq | EXTENDED_RELEASE_TABLET | Freq: Two times a day (BID) | ORAL | Status: AC
  Administered 2013-12-06 (×2): 30 meq via ORAL
  Filled 2013-12-06 (×2): qty 1

## 2013-12-06 NOTE — Progress Notes (Signed)
FMTS Attending Daily Note:  Annabell Sabal MD  [phone] pager  Family Practice pager:  [phone] I have seen and examined this patient and have reviewed their chart. I have discussed this patient with the resident. I agree with the resident's findings, assessment and care plan.  Additionally:  Late note:  Patient seen at 9:30 AM.    - Much improved today, conversant and oriented x 3.  Last thing she remembers is being at home on her couch.  HA and nausea are main concerns today, somewhat better with toradol.  Known chronic migraines.  - Afebrile today, down-trending WBC.  Still unsure exactly what we're treating here -- PNA vs UTI.  No leuk's nitrites on UA and negative culture.  Now that more awake can send for full 2 view.  No abdominal pain.   - Long discussion with son today who corroborates other family member's stories of poor social situation at home, lots of recreational drug use by family members and occasionally patient herself.  She went to "unknown doctor in [place]" earlier this week and "received some pills."  Negative tox screen on admission makes WD more likely.  - Son requesting home med list on DC sent to his email.  Wants mother to live with him but she doesn't want this currently.  - If remains better can TX from stepdown today.   Alveda Reasons, MD 12/06/2013 12:14 PM

## 2013-12-06 NOTE — Progress Notes (Signed)
Resident notified via text page that pt's SBPs have been in the 170s-180s. Connye Burkitt, RN

## 2013-12-06 NOTE — Progress Notes (Signed)
At 1200, Dr. Lonny Prude notified that pt has been bradycardic -- mainly in the low 50s (a change from yesterday). Pt also just started experiencing chest pain -- stabbing pain in the center of chest, rating a 4-5. Stat EKG has been ordered. No new orders received at this time. Rapid response notified as well. Kirby Crigler [employer]

## 2013-12-06 NOTE — Progress Notes (Signed)
Family Medicine Teaching Service Daily Progress Note Intern Pager: [phone]  Patient name: Margaret Fischer Medical record number: [id number] Date of birth: 1940-11-06 Age: 73 y.o. Gender: female  Primary Care Provider: Delia Chimes, NP Consultants: none Code Status: full, per prior admission.  Pt Overview and Major Events to Date:  4/17: sitter in room and pt with soft restraints for agitation.   Assessment and Plan: Margaret Fischer is a 73 y.o. female presenting with acute encephalopathy. PMH is significant for DM, HTN, depression, prior acute encephalopathy.   # Acute encephalopathy: Resolved.  Infectious work up including UA,chest xray and CFS are so far negative (WBC is trending down, no fever since yesterday 5:00pm, no more tachycardia) unknown source.  Some concern for possible drug intoxication vs withdrawal with family report of "issues" with pain management in the past. -will continue empiric vanc and zosyn  -blood, csf and urine cultures negative so far  -continue CIWA  # DM insulin dependent: daughter is unsure what type of insulin the patient is on at home and there appear to be 2 types listed, though levemir appears to have been filled recently in pharmacy review.  -CBGs well controlled -continue levemir 30 BID -SSI and q4 cbgs   # HTN: -consistently elevated overnight -start amlodipine 5mg  as this is on her home med list  # Hypokalemia: -replete with KCl 40 mEq PO x2   # oral thrush:  -day 2/10 of fluconazole   FEN/GI: start clear liquid diet, 1/2 NS 125/hr  Prophylaxis: heparin SQ   Disposition: If does well this morning, will transfer to floor this afternoon; discharge pending improvement in mental status and completion of work-up   Subjective:  Calm and alert this morning.  Reports a cough and nausea for the last several weeks.  Denies any new medications or medication changes prior to admission.  Family states she is doing much better.  He is very concerned about  the patient's bad breath which has been a problem for several weeks.   Objective: Temp:  [98.1 F (36.7 C)-101.7 F (38.7 C)] 98.2 F (36.8 C) (04/19 0715) Pulse Rate:  [64-85] 78 (04/19 0715) Resp:  [19-36] 19 (04/19 0715) BP: (151-180)/(53-72) 175/72 mmHg (04/19 0715) SpO2:  [93 %-98 %] 97 % (04/19 0715) Weight:  [180 lb 12.4 oz (82 kg)] 180 lb 12.4 oz (82 kg) (04/19 0444) Physical Exam: General: Alert, oriented x4, follows commands, answers questions appropriately  HEENT: moist mucosa, normal color.  No obvious thrush today Cardiovascular: rrr, no murmurs appreciated  Respiratory: good air movement, faint wheeze in RLB but otherwise clear Abdomen: Non distended, soft, not apparently tender, no masses palapted  Extremities: no edema Neuro: alert, oriented x4, follows commands, slightly slowed response to questions  Laboratory:  Recent Labs Lab 12/04/13 2225 12/05/13 0435 12/06/13 0355  WBC 16.1* 12.2* 9.2  HGB 16.0* 14.5 14.9  HCT 45.9 42.4 44.1  PLT 352 291 290    Recent Labs Lab 12/04/13 1227 12/04/13 2225 12/05/13 0435 12/06/13 0355  NA 140 137 142 138  K 3.4* 4.0 3.1* 3.1*  CL 96 99 108 100  CO2 22 19 19 22   BUN 13 10 10 9   CREATININE 0.49* 0.44* 0.44* 0.50  CALCIUM 9.6 8.3* 8.2* 8.9  PROT 7.4  --   --   --   BILITOT 1.2  --   --   --   ALKPHOS 118*  --   --   --   ALT 13  --   --   --  AST 26  --   --   --   GLUCOSE 234* 247* 132* 140*   Imaging/Diagnostic Tests: UA: >1000 glucose, >80 ketones, 100 protein, no leuks or nitrites  iStat trop negative  iStat lactic acid 4.91  Ethanol neg  UDS neg  INR 0.97   CSF colorless, clear, RBC 595, WBC 1, few seg neutrophils, few lymphs, rare monos, gram stain no WBC or organisms, glucose 116, protein 35   Ct Head Wo Contrast  12/04/2013 IMPRESSION: There is no acute intracranial abnormality. There are mild age related atrophic changes and changes of chronic small vessel ischemia.   Dg Chest Portable 1  View  12/04/2013 IMPRESSION: Stable cardiomegaly no acute cardiopulmonary disease.   Melony Overly, MD 12/06/2013, 8:55 AM PGY-3, [place] Intern pager: [phone], text pages welcome

## 2013-12-07 LAB — GLUCOSE, CAPILLARY
GLUCOSE-CAPILLARY: 159 mg/dL — AB (ref 70–99)
GLUCOSE-CAPILLARY: 167 mg/dL — AB (ref 70–99)
GLUCOSE-CAPILLARY: 213 mg/dL — AB (ref 70–99)
Glucose-Capillary: 145 mg/dL — ABNORMAL HIGH (ref 70–99)
Glucose-Capillary: 153 mg/dL — ABNORMAL HIGH (ref 70–99)
Glucose-Capillary: 283 mg/dL — ABNORMAL HIGH (ref 70–99)
Glucose-Capillary: 50 mg/dL — ABNORMAL LOW (ref 70–99)
Glucose-Capillary: 71 mg/dL (ref 70–99)

## 2013-12-07 LAB — BASIC METABOLIC PANEL
BUN: 5 mg/dL — ABNORMAL LOW (ref 6–23)
CALCIUM: 9.1 mg/dL (ref 8.4–10.5)
CO2: 30 mEq/L (ref 19–32)
CREATININE: 0.45 mg/dL — AB (ref 0.50–1.10)
Chloride: 104 mEq/L (ref 96–112)
GFR calc Af Amer: >90 mL/min (ref >90)
GLUCOSE: 59 mg/dL — AB (ref 70–99)
Potassium: 3.6 mEq/L — ABNORMAL LOW (ref 3.7–5.3)
SODIUM: 145 meq/L (ref 137–147)

## 2013-12-07 LAB — CSF CULTURE W GRAM STAIN: Culture: NO GROWTH

## 2013-12-07 LAB — CSF CULTURE

## 2013-12-07 MED ORDER — POTASSIUM CHLORIDE CRYS ER 20 MEQ PO TBCR
30.0000 meq | EXTENDED_RELEASE_TABLET | Freq: Once | ORAL | Status: AC
  Administered 2013-12-07: 30 meq via ORAL
  Filled 2013-12-07 (×2): qty 1

## 2013-12-07 MED ORDER — ONDANSETRON 4 MG PO TBDP
4.0000 mg | ORAL_TABLET | Freq: Three times a day (TID) | ORAL | Status: DC | PRN
  Filled 2013-12-07: qty 1

## 2013-12-07 MED ORDER — CLONAZEPAM 0.5 MG PO TABS
0.5000 mg | ORAL_TABLET | Freq: Every day | ORAL | Status: DC
  Administered 2013-12-07: 0.5 mg via ORAL
  Filled 2013-12-07: qty 1

## 2013-12-07 MED ORDER — ONDANSETRON HCL 4 MG PO TABS
4.0000 mg | ORAL_TABLET | Freq: Three times a day (TID) | ORAL | Status: DC | PRN
  Administered 2013-12-07 – 2013-12-08 (×2): 4 mg via ORAL
  Filled 2013-12-07: qty 1

## 2013-12-07 MED ORDER — AMLODIPINE BESYLATE 10 MG PO TABS
10.0000 mg | ORAL_TABLET | Freq: Every day | ORAL | Status: DC
  Administered 2013-12-08: 10 mg via ORAL
  Filled 2013-12-07: qty 1

## 2013-12-07 MED ORDER — FLUCONAZOLE 100 MG PO TABS
100.0000 mg | ORAL_TABLET | Freq: Every day | ORAL | Status: DC
  Administered 2013-12-07 – 2013-12-08 (×2): 100 mg via ORAL
  Filled 2013-12-07 (×2): qty 1

## 2013-12-07 NOTE — Progress Notes (Signed)
Seen and examined.  Agree with Dr. Milagros Reap management and documentation.  Much clearer today.  The cause of her delirium is unclear to me.  Given all her cultures are neg, we have stopped antibiotics and I am reluctant to conclude that infection caused this problem.  Perhaps it is polypharmacy.  Perhaps it is her home situation.  While I am delighted that she is improving, I would like to have a better understanding about why she became delirious.

## 2013-12-07 NOTE — Progress Notes (Signed)
Admission note:   Arrival Method: Via w/c from 3S. Mental Status: A&OX4, intermittent confusion noted. Telemetry: N/A  Skin: Skin tear to right forearm - foam intact. Skin tear/abrasion to right shin - foam intact. Multiple ecchymotic areas to bilateral upper extremities.  Tubes: N/A IV: Left forearm. Pain: Denies.  Family: Husband (ex?) at bedside.  Living Situation: Home. Safety Measures: Call bell within reach, fall risk bracelet, bed alarm in place. 6E Orientation: Oriented to unit and surroundings.   Margaret Fischer Hedberg [name]

## 2013-12-07 NOTE — Discharge Summary (Signed)
[facility] Discharge Summary  Patient name: Margaret Fischer Medical record number: [id number] Date of birth: 11/14/1940 Age: 73 y.o. Gender: female Date of Admission: 12/04/2013  Date of Discharge: 12/08/13 Admitting Physician: Alveda Reasons, MD  Primary Care Provider: Delia Chimes, NP Consultants: CCM  Indication for Hospitalization: Acute encephalopathy  Discharge Diagnoses/Problem List:  Acute encephalopathy DM2 HTN Anxiety  Disposition: home  Discharge Condition: stable  Brief Hospital Course:  Margaret Fischer is a 73 y.o. female presenting with acute encephalopathy and fever (102.4). PMH is significant for DM, HTN, depression, prior acute encephalopathy. Initially treated with broad spectrum antibiotics and sedating medications held. CT head was negative for acute pathology, blood, urine and CSF cultures were negative x 3 days. Her mental status returned to baseline and antibiotics were discontinued as no source was found. She was discharged home without restarting sedating medication as she remained afebrile with normal WBC for > 48 hrs off antibiotics.   # Acute encephalopathy: Resolved. Possibly infectious (unknown source) due to fever ~103 with elevated WBC on admission, but Infectious work up including UA, chest xray, and Cultures (Blood, Urine, CFS) were negative at discharge.  She was afebrile with normal WBC for > 48 on discharge off antibiotics.  Some concern for possible drug intoxication with family report of "issues" with pain management in the past or withdrawal due to concern that daughter with reported PTSD taking her medications. Discontinued Klonopin on discharge; She reported not having this for > 1 month stating "WE ran out."  Also discontinued Gabapentin. Set up with home health PT and SW on discharge.   # Anxiety: Reported a lot of worry on day of discharge about "having her lights turned out at home due to financial difficulties."  Reports that she lives with Daughter who hasn't had a job in years due to PTSD and "friend". 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 Continued Prozac, and advised PCP visit this week for ongoing medical management and Psychology referral.   # DM insulin dependent: Continue levemir 30 BID. Restarted Metformin on discharge  # HTN: Consistently elevated this admission. Inc amlodipine to 10mg . Started Lisinopril 20mg  qd. Discontinued Lasix   # Hypokalemia: Resolved   Issues for Follow Up:   Recommend Psychology referral for anxiety. Klonopin and Gabapentin discontinue to AMS and report that she had not had this medications for a month.   HTN: Increased Norvasc and Started Lisinopril (check BMET)   Very anxious about her living situation  Significant Procedures: Lumbar Puncture  Significant Labs and Imaging:   Recent Labs Lab 12/05/13 0435 12/06/13 0355 12/08/13 0436  WBC 12.2* 9.2 7.6  HGB 14.5 14.9 15.8*  HCT 42.4 44.1 46.5*  PLT 291 290 332    Recent Labs Lab 12/04/13 1227 12/04/13 2225 12/05/13 0435 12/06/13 0355 12/07/13 0241 12/08/13 0436  NA 140 137 142 138 145 144  K 3.4* 4.0 3.1* 3.1* 3.6* 3.5*  CL 96 99 108 100 104 104  CO2 22 19 19 22 30 27   GLUCOSE 234* 247* 132* 140* 59* 64*  BUN 13 10 10 9  5* 6  CREATININE 0.49* 0.44* 0.44* 0.50 0.45* 0.40*  CALCIUM 9.6 8.3* 8.2* 8.9 9.1 9.2  ALKPHOS 118*  --   --   --   --   --   AST 26  --   --   --   --   --   ALT 13  --   --   --   --   --  ALBUMIN 4.3  --   --   --   --   --    Ct Head Wo Contrast  12/04/2013   CLINICAL DATA:  Altered mental status ; history of previous CVA and diabetes ; the patient was unable to remain motionless on scanner.  EXAM: CT HEAD WITHOUT CONTRAST  TECHNIQUE: Contiguous axial images were obtained from the base of the skull through the vertex without intravenous contrast.  COMPARISON:  CT HEAD W/O CM dated 08/30/2013   FINDINGS: There is mild age appropriate diffuse cerebral and cerebellar atrophy with compensatory ventriculomegaly. These findings are stable. There is no evidence of an acute intracranial hemorrhage nor of an evolving ischemic infarction. There is an old lacunar infarction in the periphery of the right basal ganglia. The cerebellum and brainstem are normal in density.  At bone window settings the observed portions of the paranasal sinuses and mastoid air cells are clear. No acute skull fracture is demonstrated.  IMPRESSION: There is no acute intracranial abnormality. There are mild age related atrophic changes and changes of chronic small vessel ischemia.   Electronically Signed   By: David  Martinique   On: 12/04/2013 13:35   Dg Chest Port 1 View  12/05/2013   CLINICAL DATA:  Cough, fever, altered mental status  EXAM: PORTABLE CHEST - 1 VIEW  COMPARISON:  12/04/2013; 08/30/2013 ; 08/13/2011  FINDINGS: Grossly unchanged enlarged cardiac silhouette and mediastinal contours with slight which is attributable to decreased lung volumes and patient rotation. Pulmonary vasculature per/cyst in the present examination. Slight worsening bibasilar heterogeneous opacities, left greater than right. No definite pleural effusion, though note, the right costophrenic angles excluded from view. No definite pneumothorax. Grossly unchanged bones.  IMPRESSION: 1. Mild pulmonary edema suspected on this AP portable examination. Further evaluation with a PA and lateral chest radiograph may be obtained as clinically indicated. 2. Grossly unchanged bibasilar heterogeneous opacities, likely atelectasis.   Electronically Signed   By: Sandi Mariscal M.D.   On: 12/05/2013 12:00   Dg Chest Portable 1 View  12/04/2013   CLINICAL DATA:  ALTERED MENTAL STATUS EMESIS HYPERTENSION  EXAM: PORTABLE CHEST - 1 VIEW  COMPARISON:  DG CHEST 1V PORT dated 08/30/2013; DG CHEST 1V PORT dated 12/12/2010; CT ANGIO CHEST W/CM &/OR WO/CM dated 10/05/2010  FINDINGS:  The cardiac silhouette is enlarged. Lungs are clear. Mild degenerative changes within the shoulders and postsurgical changes right shoulder. Atherosclerotic calcifications appreciated in the aortic arch.  IMPRESSION: Stable cardiomegaly no acute cardiopulmonary disease.   Electronically Signed   By: Margaree Mackintosh M.D.   On: 12/04/2013 12:44   Results/Tests Pending at Time of Discharge: Cultures: Blood, Urine, CSF  Discharge Medications:    Medication List    STOP taking these medications       clonazePAM 2 MG tablet  Commonly known as:  KLONOPIN     furosemide 40 MG tablet  Commonly known as:  LASIX     gabapentin 300 MG capsule  Commonly known as:  NEURONTIN     LANTUS 100 UNIT/ML injection  Generic drug:  insulin glargine     oxyCODONE-acetaminophen 10-325 MG per tablet  Commonly known as:  PERCOCET      TAKE these medications       amitriptyline 10 MG tablet  Commonly known as:  ELAVIL  Take 15 mg by mouth at bedtime.     amLODipine 10 MG tablet  Commonly known as:  NORVASC  Take 1 tablet (10 mg total) by mouth  daily.     aspirin 325 MG tablet  Take 325 mg by mouth 2 (two) times daily.     B-12 PO  Take 1 tablet by mouth daily.     diclofenac 75 MG EC tablet  Commonly known as:  VOLTAREN  Take 75 mg by mouth 2 (two) times daily.     FLUoxetine 40 MG capsule  Commonly known as:  PROZAC  Take 80 mg by mouth daily.     glucosamine-chondroitin 500-400 MG tablet  Take 1 tablet by mouth 2 (two) times daily.     insulin detemir 100 UNIT/ML injection  Commonly known as:  LEVEMIR  Inject 0.3 mLs (30 Units total) into the skin 2 (two) times daily.     lisinopril 20 MG tablet  Commonly known as:  PRINIVIL,ZESTRIL  Take 1 tablet (20 mg total) by mouth daily.     metFORMIN 1000 MG tablet  Commonly known as:  GLUCOPHAGE  Take 1,000 mg by mouth 2 (two) times daily with a meal.     NITROSTAT 0.4 MG SL tablet  Generic drug:  nitroGLYCERIN  Place 0.4 mg under the  tongue every 5 (five) minutes as needed for chest pain.     potassium chloride 20 MEQ packet  Commonly known as:  KLOR-CON  Take 20 mEq by mouth 2 (two) times daily.     pravastatin 40 MG tablet  Commonly known as:  PRAVACHOL  Take 1 tablet (40 mg total) by mouth daily.     VITAMIN D-3 PO  Take by mouth daily.        Discharge Instructions: Please refer to Patient Instructions section of EMR for full details.  Patient was counseled important signs and symptoms that should prompt return to medical care, changes in medications, dietary instructions, activity restrictions, and follow up appointments.   Follow-Up Appointments: Follow-up Information   Schedule an appointment as soon as possible for a visit with Delia Chimes, NP.   Specialty:  Nurse Practitioner   Contact information:   [address] [place] [address] [phone]       Phill Myron, MD 12/08/2013, 2:59 PM PGY-1, [place]

## 2013-12-07 NOTE — Progress Notes (Signed)
1134: Report given to [place]. Awaiting potassium from pharmacy, will tube to new location if not available before transfer. eICU and CCMT notified of transfer. Transferred by NT via wheelchair. VSS. Nutrition called to transfer tray, No CBG done on 3S.

## 2013-12-07 NOTE — Progress Notes (Signed)
Family Medicine Teaching Service Daily Progress Note Intern Pager: [phone]  Patient name: Margaret Fischer [place] Medical record number: [id number] Date of birth: 11-21-1940 Age: 73 y.o. Gender: female  Primary Care Provider: Delia Chimes, NP Consultants: none Code Status: full, per prior admission.  Pt Overview and Major Events to Date:  4/17: sitter in room and pt with soft restraints for agitation.   Assessment and Plan: Margaret Fischer is a 73 y.o. female presenting with acute encephalopathy. PMH is significant for DM, HTN, depression, prior acute encephalopathy.   # Acute encephalopathy: Resolved.  Infectious work up including UA,chest xray and CFS are so far negative (WBC is trending down, no fever since 4/18 @ 5:00pm, no more tachycardia) unknown source.  Some concern for possible drug intoxication vs withdrawal with family report of "issues" with pain management in the past. - vanc and zosyn: stopped today (4/18 >>4/20); Tabx day 4; acyclovir (4/17) - blood, csf and urine cultures negative x 3 days - CIWA: scores < 11 last 24 hrs - PT/OT  - D/c foley - Wean Klonopin on d/c  # DM insulin dependent: daughter is unsure what type of insulin the patient is on at home and there appear to be 2 types listed, though levemir appears to have been filled recently in pharmacy review.  -CBGs well controlled -continue levemir 30 BID -SSI and q4 cbgs   # HTN: -consistently elevated overnight - Inc amlodipine 10mg  on 4/2 - D/c'd Lasix 4/20  # Hypokalemia: -replete with KCl 30 mEq PO x 1   # oral thrush:  -day 3/10 (started 4/18) of fluconazole (switched to PO)  FEN/GI: Card mod diet, 1/2 NS 75/hr  Prophylaxis: heparin SQ   Disposition: discharge pending improvement in mental status and completion of work-up   Subjective:  Reports pain in arms and legs at areas of bruising. Denies CP or SOB. No cough or dysuria.   Objective: Temp:  [97 F (36.1 C)-98.5 F (36.9 C)] 98.1 F (36.7 C)  (04/20 0425) Pulse Rate:  [54-78] 69 (04/20 0425) Resp:  [17-24] 17 (04/20 0425) BP: (150-182)/(54-67) 154/60 mmHg (04/20 0425) SpO2:  [93 %-98 %] 95 % (04/20 0425) Weight:  [182 lb 5.1 oz (82.7 kg)] 182 lb 5.1 oz (82.7 kg) (04/20 0425) Physical Exam: General: Alert, oriented x4, follows commands, answers questions appropriately  HEENT: moist mucosa, normal color.  No obvious thrush today Cardiovascular: rrr, no murmurs appreciated  Respiratory: good air movement, CTAB Abdomen: Non distended, soft, not apparently tender, no masses palapted  Extremities: no edema  Laboratory:  Recent Labs Lab 12/04/13 2225 12/05/13 0435 12/06/13 0355  WBC 16.1* 12.2* 9.2  HGB 16.0* 14.5 14.9  HCT 45.9 42.4 44.1  PLT 352 291 290    Recent Labs Lab 12/04/13 1227  12/05/13 0435 12/06/13 0355 12/07/13 0241  NA 140  < > 142 138 145  K 3.4*  < > 3.1* 3.1* 3.6*  CL 96  < > 108 100 104  CO2 22  < > 19 22 30   BUN 13  < > 10 9 5*  CREATININE 0.49*  < > 0.44* 0.50 0.45*  CALCIUM 9.6  < > 8.2* 8.9 9.1  PROT 7.4  --   --   --   --   BILITOT 1.2  --   --   --   --   ALKPHOS 118*  --   --   --   --   ALT 13  --   --   --   --  AST 26  --   --   --   --   GLUCOSE 234*  < > 132* 140* 59*  < > = values in this interval not displayed. Imaging/Diagnostic Tests: UA: >1000 glucose, >80 ketones, 100 protein, no leuks or nitrites  iStat trop negative  iStat lactic acid 4.91  Ethanol neg  UDS neg  INR 0.97   CSF colorless, clear, RBC 595, WBC 1, few seg neutrophils, few lymphs, rare monos, gram stain no WBC or organisms, glucose 116, protein 35   Ct Head Wo Contrast  12/04/2013 IMPRESSION: There is no acute intracranial abnormality. There are mild age related atrophic changes and changes of chronic small vessel ischemia.   Dg Chest Portable 1 View  12/04/2013 IMPRESSION: Stable cardiomegaly no acute cardiopulmonary disease.   Phill Myron, MD 12/07/2013, 7:44 AM PGY-1, [place] Intern pager: [phone], text pages welcome
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
## 2013-12-07 NOTE — Clinical Documentation Improvement (Signed)
Presents with acute encephalopathy with many differential diagnoses as to cause.  After study, please clarify the likely etiology of the patient's acute encephalopathy:   Acute encephalopathy secondary to: Pneumonia             UTI                        DKA                        Drug/Alcohol Withdrawal                         Other Condition  Your response will allow the Coders to accurately define the Principle Diagnosis and Cause of Admission.      Thank You, Zoila Shutter ,RN Clinical Documentation Specialist:  [place] [facility]
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
## 2013-12-07 NOTE — Progress Notes (Signed)
Hypoglycemic Event  CBG: 50  Treatment: 15 GM carbohydrate snack  Symptoms: None  Follow-up CBG: [id number] CBG Result:71  Possible Reasons for Event: Unknown  Comments/MD notified:Continue to monitor    [name] Margaret Fischer  Remember to initiate Hypoglycemia Order Set & complete
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
## 2013-12-08 LAB — GLUCOSE, CAPILLARY
GLUCOSE-CAPILLARY: 153 mg/dL — AB (ref 70–99)
GLUCOSE-CAPILLARY: 165 mg/dL — AB (ref 70–99)
GLUCOSE-CAPILLARY: 73 mg/dL (ref 70–99)
Glucose-Capillary: 181 mg/dL — ABNORMAL HIGH (ref 70–99)
Glucose-Capillary: 164 mg/dL — ABNORMAL HIGH (ref 70–99)

## 2013-12-08 LAB — CBC
HCT: 46.5 % — ABNORMAL HIGH (ref 36.0–46.0)
Hemoglobin: 15.8 g/dL — ABNORMAL HIGH (ref 12.0–15.0)
MCH: 32.2 pg (ref 26.0–34.0)
MCHC: 34 g/dL (ref 30.0–36.0)
MCV: 94.7 fL (ref 78.0–100.0)
PLATELETS: 332 10*3/uL (ref 150–400)
RBC: 4.91 MIL/uL (ref 3.87–5.11)
RDW: 12.6 % (ref 11.5–15.5)
WBC: 7.6 10*3/uL (ref 4.0–10.5)

## 2013-12-08 LAB — BASIC METABOLIC PANEL
BUN: 6 mg/dL (ref 6–23)
CALCIUM: 9.2 mg/dL (ref 8.4–10.5)
CO2: 27 meq/L (ref 19–32)
Chloride: 104 mEq/L (ref 96–112)
Creatinine, Ser: 0.4 mg/dL — ABNORMAL LOW (ref 0.50–1.10)
GFR calc Af Amer: >90 mL/min (ref >90)
GFR calc non Af Amer: >90 mL/min (ref >90)
GLUCOSE: 64 mg/dL — AB (ref 70–99)
POTASSIUM: 3.5 meq/L — AB (ref 3.7–5.3)
SODIUM: 144 meq/L (ref 137–147)

## 2013-12-08 LAB — VDRL, CSF: SYPHILIS VDRL QUANT CSF: NONREACTIVE

## 2013-12-08 MED ORDER — AMLODIPINE BESYLATE 10 MG PO TABS: 10.0000 mg | ORAL_TABLET | Freq: Every day | ORAL | Status: AC

## 2013-12-08 MED ORDER — LISINOPRIL 20 MG PO TABS: 20.0000 mg | ORAL_TABLET | Freq: Every day | ORAL | Status: AC

## 2013-12-08 NOTE — Progress Notes (Signed)
While RN was going over discharge with patient, patient stated she was not taking meds that were listed under "your daily medication list." Called patient's pharmacy,[facility] and went over medications with pharmacist. Results as follows: ELAVIL : has been taking for long time,getting refilled NORVASC: has never gotten filled ASA,B-12,GLUCOSAMINE/CHONDROITIN,D-3,and POTASSIUM : over the counter and unsure if obtained VOLTAREN : got on the 7th of this month but only got 20 tablets PROZAC : waiting for patient to pick up LEVEMIR : has never been on,but was taking LANTUS (has not had filled since 06 of 2014) LISINOPRIL : was on 10mg  but has not gotten filled since 12/14 METFORMIN : last filled -1/15 NTG, PRAVACHOL : never filled. Will text MD to notify and advise SR:[id number].

## 2013-12-08 NOTE — Discharge Instructions (Signed)
Margaret Fischer you were admitted to the hospital due to Confusion. No infections were found. Your confusion could have been caused by several of the medications you were taking. We have discontinued Klonopin and Gabapentin. You should make an appointment to see your primary care doctor this week to discuss you anxiety and should get a referral to a psychologist. Your blood pressure was also found to be high, so you should start taking Lisinopril daily and we increased your Norvasc to 10mg  daily.

## 2013-12-08 NOTE — Progress Notes (Addendum)
FPTS PGY-2 Interim Note  Paged by RN prior to discharge; see note by A. Truitt Leep, RN at 3:55 PM. Pt reports differences in her home med list comparing what she has been taking / filling at her pharmacy to what her DC information shows (which in turn is based on med rec from admission). In summary, advised that pt should start lisinopril and amlodipine as per discharge instructions (these were specifically started by our service this admission), continue what she has been taking prior to admission (with the exception of STOPPING Klonopin and gabapentin), and to follow up with her PCP in order to sort out what long-term medications she should or should not be on. Otherwise see previous progress notes and discharge summary.  Emmaline Kluver, MD PGY-2, [place] [facility] 12/08/2013, 4:15 PM [place] Service pager: [phone] (text pages welcome through [place])  Addendum: correction to the above (stopped KLONOPIN this admission, not Xanax; pt was not on Xanax)
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
## 2013-12-08 NOTE — Discharge Summary (Signed)
Seen and examined.  Agree with Dr. Milagros Reap DC documentation and management.

## 2013-12-08 NOTE — Progress Notes (Signed)
Physical Therapy Treatment/Vestibular Eval Patient Details Name: Margaret Fischer MRN: [id number] DOB: Nov 29, 1940 Today's Date: 12/08/2013    History of Present Illness Margaret Fischer is a 73 y.o. female presenting with acute encephalopathy. PMH is significant for DM, HTN, depression, prior acute encephalopathy.     PT Comments    Patient presents with decreased vestibular inputs bilaterally with impaired ocular tilt reflex.  She has had years of imbalance and will benefit from follow up HHPT with vestibular and balance training.  May need referral to outpatient when able to move beyond HHPT.  Follow Up Recommendations  Home health PT;Supervision/Assistance - 24 hour (HHPT for vestibular and balance rehab)     Equipment Recommendations  Rolling walker with 5" wheels    Recommendations for Other Services       Precautions / Restrictions Precautions Precautions: Fall Precaution Comments: reports fall in 2012 down steps with head trauma never had medical follow up    Mobility  Bed Mobility Overal bed mobility: Modified Independent                Transfers Overall transfer level: Needs assistance   Transfers: Sit to/from Stand Sit to Stand: Min guard;Modified independent (Device/Increase time)         General transfer comment: minguard from bed for safety (sat back on bed after initial stand due to symptoms), stood in bathroom from toilet unaided; cues for visual target to diminish symptoms  Ambulation/Gait Ambulation/Gait assistance: Min guard;Supervision Ambulation Distance (Feet): 12 Feet (x 2) Assistive device: None Gait Pattern/deviations: Step-through pattern;WFL(Within Functional Limits)     General Gait Details: to bathroom and back minguard for safety, catches hold of sink, Journalist, newspaper    Modified Rankin (Stroke Patients Only)       Balance           Standing balance support: No upper extremity  supported;During functional activity Standing balance-Leahy Scale: Fair Standing balance comment: standing at sink to wash hands                    Cognition Arousal/Alertness: Awake/alert Behavior During Therapy: WFL for tasks assessed/performed Overall Cognitive Status: Within Functional Limits for tasks assessed                      Vestibular  Subjective: describes symptoms as wooziness, relates imbalance over many years and fall in 2012 with head trauma down [place] in [place] (but did not seek medical help).  Hearing in left ear worse than right; decreased vision left eye compared to right h/o Lasik surgery, has macular degeneration Oculomotor: smooth pursuits and saccades WNL except symptomatic to 8/10 with smooth pursuits, note left eye higher in orbit and with decreased motion with head nods during VOR testing Vestibular: Performs vestibular ocular reflex horizontal and vertical for 10 head turns each with symptoms 7-8/10; VOR cancellation WNL, horizontal head shake test without nystagmus, but positive for increased symptoms 9/10 with nausea; head thrust test positive bilaterally for refixation and severe symptoms; ocular tilt testing with right eye pt reports tipping either way causes image to double and split; on left only blurs with tilt to right. Positional: deferred due to symptomatic with above testing    General Comments        Pertinent Vitals/Pain C/o headache after vestibular testing; provided cool cloth    Home Living  Prior Function            PT Goals (current goals can now be found in the care plan section) Progress towards PT goals: Progressing toward goals    Frequency  Min 3X/week    PT Plan Current plan remains appropriate    Co-evaluation             End of Session Equipment Utilized During Treatment: Gait belt Activity Tolerance: Other (comment) (limited by nausea and dizziness symptoms) Patient  left: in bed;with bed alarm set;with call bell/phone within reach     Time: 1013-1052 PT Time Calculation (min): 39 min  Charges:  $Gait Training: 8-22 mins $Neuromuscular Re-education: 8-22 mins $Self Care/Home Management: 05-06-23                    G Codes:      Max Sane 2014-01-02, 11:18 AM Magda Kiel, PT [phone] 01-02-14

## 2013-12-08 NOTE — Progress Notes (Signed)
Family Medicine Teaching Service Daily Progress Note Intern Pager: [phone]  Patient name: Margaret Fischer Medical record number: [id number] Date of birth: Oct 15, 1940 Age: 73 y.o. Gender: female  Primary Care Provider: Delia Chimes, NP Consultants: none Code Status: full, per prior admission.  Pt Overview and Major Events to Date:  4/17: sitter in room and pt with soft restraints for agitation.   Assessment and Plan: Margaret Fischer is a 73 y.o. female presenting with acute encephalopathy. PMH is significant for DM, HTN, depression, prior acute encephalopathy.   # Acute encephalopathy: Resolved.  Infectious work up including UA,chest xray and CFS are so far negative (WBC is trending down, no fever since 4/18 @ 5:00pm, no more tachycardia) unknown source.  Some concern for possible drug intoxication vs withdrawal with family report of "issues" with pain management in the past. - vanc and zosyn: stopped today (4/18 >>4/20); Tabx day 4; acyclovir (4/17) - blood, csf and urine cultures negative x 3 days - CIWA: scores < 11 last 24 hrs - PT: Home health PT;Supervision/Assistance - 24 hour; OT pending - Wean Klonopin on d/c  # DM insulin dependent: daughter is unsure what type of insulin the patient is on at home and there appear to be 2 types listed, though levemir appears to have been filled recently in pharmacy review.  -CBGs well controlled -continue levemir 30 BID -SSI and q4 cbgs   # HTN: -consistently elevated overnight - Inc amlodipine 10mg  on 4/2 - D/c'd Lasix 4/20  # Hypokalemia:  # oral thrush:  -day 3/10 (started 4/18) of fluconazole (switched to PO)  FEN/GI: Card mod diet, 1/2 NS 75/hr  Prophylaxis: heparin SQ   Disposition: discharge pending improvement in mental status and completion of work-up   Subjective:  Very sad this morning "I'm going to have my light turned off". Denies any CP,SOB.   Objective: Temp:  [97.8 F (36.6 C)-98.7 F (37.1 C)] 97.8 F (36.6 C)  (04/21 0900) Pulse Rate:  [61-78] 75 (04/21 0900) Resp:  [12-18] 18 (04/21 0900) BP: (155-199)/(59-92) 199/83 mmHg (04/21 0900) SpO2:  [94 %-98 %] 98 % (04/21 0900) Physical Exam: General: Alert, oriented x4, follows commands, answers questions appropriately  HEENT: moist mucosa, normal color.  No obvious thrush today Cardiovascular: rrr, no murmurs appreciated  Respiratory: good air movement, CTAB Abdomen: Non distended, soft, not apparently tender, no masses palapted  Extremities: no edema  Laboratory:  Recent Labs Lab 12/05/13 0435 12/06/13 0355 12/08/13 0436  WBC 12.2* 9.2 7.6  HGB 14.5 14.9 15.8*  HCT 42.4 44.1 46.5*  PLT 291 290 332    Recent Labs Lab 12/04/13 1227  12/06/13 0355 12/07/13 0241 12/08/13 0436  NA 140  < > 138 145 144  K 3.4*  < > 3.1* 3.6* 3.5*  CL 96  < > 100 104 104  CO2 22  < > 22 30 27   BUN 13  < > 9 5* 6  CREATININE 0.49*  < > 0.50 0.45* 0.40*  CALCIUM 9.6  < > 8.9 9.1 9.2  PROT 7.4  --   --   --   --   BILITOT 1.2  --   --   --   --   ALKPHOS 118*  --   --   --   --   ALT 13  --   --   --   --   AST 26  --   --   --   --   GLUCOSE 234*  < >  140* 59* 64*  < > = values in this interval not displayed. Imaging/Diagnostic Tests: UA: >1000 glucose, >80 ketones, 100 protein, no leuks or nitrites  iStat trop negative  iStat lactic acid 4.91  Ethanol neg  UDS neg  INR 0.97   CSF colorless, clear, RBC 595, WBC 1, few seg neutrophils, few lymphs, rare monos, gram stain no WBC or organisms, glucose 116, protein 35   Ct Head Wo Contrast  12/04/2013 IMPRESSION: There is no acute intracranial abnormality. There are mild age related atrophic changes and changes of chronic small vessel ischemia.   Dg Chest Portable 1 View  12/04/2013 IMPRESSION: Stable cardiomegaly no acute cardiopulmonary disease.   Phill Myron, MD 12/08/2013, 10:01 AM PGY-1, [place] Intern pager: [phone], text pages welcome

## 2013-12-08 NOTE — Progress Notes (Signed)
Seen and examined.  Agree with Dr. Milagros Reap documentation and management.

## 2013-12-08 NOTE — Progress Notes (Signed)
CARE MANAGEMENT NOTE 12/08/2013  Patient:  Margaret Fischer, Margaret Fischer   Account Number:  [id number]  Date Initiated:  12/08/2013  Documentation initiated by:  Lizabeth Leyden  Subjective/Objective Assessment:   admitted with AMS, sepsis     Action/Plan:   home health PT, SW   Anticipated DC Date:  12/08/2013   Anticipated DC Plan:  [name]  CM consult      [facility] Choice  HOME HEALTH   Choice offered to / List presented to:  C-1 Patient   DME arranged  Vassie Moselle      DME agency  [place] arranged  [place]      [place].   Status of service:  Completed, signed off Medicare Important Message given?   (If response is "NO", the following Medicare IM given date fields will be blank) Date Medicare IM given:   Date Additional Medicare IM given:    Discharge Disposition:  [place]  Per UR Regulation:    If discussed at Long Length of Stay Meetings, dates discussed:    Comments:  12/08/2013  [place], [place] [phone] CM referral:  home health PT, SW                      DME:  RW  Met with patient for choice of home health agency, she selected [facility].  [facility]/Stephanie called with referral  [facility]/Justin called with referral for RW

## 2013-12-10 LAB — CULTURE, BLOOD (ROUTINE X 2)
CULTURE: NO GROWTH
Culture: NO GROWTH

## 2014-01-11 ENCOUNTER — Other Ambulatory Visit (HOSPITAL_COMMUNITY): Payer: Self-pay | Admitting: Family Medicine

## 2014-03-03 ENCOUNTER — Telehealth: Payer: Self-pay | Admitting: Neurology

## 2014-03-03 NOTE — Telephone Encounter (Signed)
Patient calling to state that she fell and hit her head 4 days ago on the concrete, states she has a big lump on her head about half the size of a grapefruit, is wondering if she needs to have a CT scan before her appointment. Please return call to patient and advise.

## 2014-03-04 NOTE — Telephone Encounter (Signed)
Called pt and left message informing her to give our office a call back.

## 2014-03-05 ENCOUNTER — Telehealth: Payer: Self-pay | Admitting: Neurology

## 2014-03-05 NOTE — Telephone Encounter (Signed)
Patient returning call about falling and hitting her head. Please call.

## 2014-03-08 ENCOUNTER — Other Ambulatory Visit: Payer: Self-pay | Admitting: Neurology

## 2014-03-08 DIAGNOSIS — R51 Headache: Secondary | ICD-10-CM

## 2014-03-08 NOTE — Telephone Encounter (Signed)
I called pt and she relayed that she slipped and fell outside and bumped her head (happened around 7-10, 7-11).  Stated that size filled her cupped hand.  She stated the lump has receded.  Has had transient headache and dizziness since.  She lives with her daughter.   She takes 325mg  aspirin.  No new meds.  She did not lose consciousness.  She is answering questions appropriately.  She did see her pcp Margaret Chimes, NP) after this happened but was not addressed. She has appt with you on  03-17-14 (f/u headache/gait).  Get CT?

## 2014-03-08 NOTE — Telephone Encounter (Signed)
Please let her know I want her to get a head CT. The order was placed. Thanks.

## 2014-03-09 NOTE — Telephone Encounter (Signed)
I called pt and relayed that Dr. Janann Colonel ordered a CT for her, she is still with dizziness.   I instructed to go to her pcp, but she stated she would not do anything.  [facility] will call pt to schedule.   FAXED to [phone].

## 2014-03-11 ENCOUNTER — Ambulatory Visit
Admission: RE | Admit: 2014-03-11 | Discharge: 2014-03-11 | Disposition: A | Discharge status: Home / Self Care | Payer: Medicare Other | Source: Ambulatory Visit | Attending: Neurology | Admitting: Neurology

## 2014-03-11 DIAGNOSIS — R51 Headache: Secondary | ICD-10-CM

## 2014-03-12 ENCOUNTER — Telehealth: Payer: Self-pay | Admitting: Neurology

## 2014-03-12 NOTE — Telephone Encounter (Signed)
Patient is returning a call. °

## 2014-03-12 NOTE — Telephone Encounter (Signed)
Patient is aware that her CT scan was normal.

## 2014-03-12 NOTE — Telephone Encounter (Signed)
Message copied by Vivi Barrack on Fri Mar 12, 2014  3:46 PM ------      Message from: Margaret Fischer      Created: Fri Mar 12, 2014  7:48 AM       Please let her know her head CT was normal. Thanks. ------

## 2014-03-17 ENCOUNTER — Ambulatory Visit: Payer: PRIVATE HEALTH INSURANCE | Admitting: Neurology

## 2014-04-08 ENCOUNTER — Ambulatory Visit: Payer: PRIVATE HEALTH INSURANCE | Admitting: Neurology

## 2014-09-30 ENCOUNTER — Other Ambulatory Visit: Payer: Self-pay | Admitting: Neurology

## 2014-10-01 NOTE — Telephone Encounter (Signed)
I tried to call patient, number is temp out of service.

## 2015-02-14 ENCOUNTER — Other Ambulatory Visit: Payer: Self-pay

## 2015-08-28 ENCOUNTER — Emergency Department (HOSPITAL_COMMUNITY): Payer: Medicare Other

## 2015-08-28 ENCOUNTER — Inpatient Hospital Stay (HOSPITAL_COMMUNITY)
Admission: EM | Admit: 2015-08-28 | Discharge: 2015-09-07 | DRG: 082 | Disposition: A | Discharge status: Skilled Nursing Facility | Payer: Medicare Other | Attending: Internal Medicine | Admitting: Internal Medicine

## 2015-08-28 ENCOUNTER — Inpatient Hospital Stay (HOSPITAL_COMMUNITY): Payer: Medicare Other

## 2015-08-28 ENCOUNTER — Encounter (HOSPITAL_COMMUNITY): Payer: Self-pay | Admitting: Emergency Medicine

## 2015-08-28 DIAGNOSIS — Z888 Allergy status to other drugs, medicaments and biological substances status: Secondary | ICD-10-CM

## 2015-08-28 DIAGNOSIS — D473 Essential (hemorrhagic) thrombocythemia: Secondary | ICD-10-CM | POA: Insufficient documentation

## 2015-08-28 DIAGNOSIS — I10 Essential (primary) hypertension: Secondary | ICD-10-CM | POA: Diagnosis present

## 2015-08-28 DIAGNOSIS — Z7982 Long term (current) use of aspirin: Secondary | ICD-10-CM

## 2015-08-28 DIAGNOSIS — R739 Hyperglycemia, unspecified: Secondary | ICD-10-CM | POA: Insufficient documentation

## 2015-08-28 DIAGNOSIS — F329 Major depressive disorder, single episode, unspecified: Secondary | ICD-10-CM | POA: Diagnosis present

## 2015-08-28 DIAGNOSIS — E0800 Diabetes mellitus due to underlying condition with hyperosmolarity without nonketotic hyperglycemic-hyperosmolar coma (NKHHC): Secondary | ICD-10-CM | POA: Diagnosis present

## 2015-08-28 DIAGNOSIS — S0280XA Fracture of other specified skull and facial bones, unspecified side, initial encounter for closed fracture: Secondary | ICD-10-CM | POA: Diagnosis not present

## 2015-08-28 DIAGNOSIS — Z85828 Personal history of other malignant neoplasm of skin: Secondary | ICD-10-CM

## 2015-08-28 DIAGNOSIS — B3789 Other sites of candidiasis: Secondary | ICD-10-CM | POA: Diagnosis present

## 2015-08-28 DIAGNOSIS — S0232XA Fracture of orbital floor, left side, initial encounter for closed fracture: Secondary | ICD-10-CM | POA: Diagnosis present

## 2015-08-28 DIAGNOSIS — F1210 Cannabis abuse, uncomplicated: Secondary | ICD-10-CM | POA: Diagnosis not present

## 2015-08-28 DIAGNOSIS — Z885 Allergy status to narcotic agent status: Secondary | ICD-10-CM

## 2015-08-28 DIAGNOSIS — F411 Generalized anxiety disorder: Secondary | ICD-10-CM | POA: Diagnosis present

## 2015-08-28 DIAGNOSIS — E1140 Type 2 diabetes mellitus with diabetic neuropathy, unspecified: Secondary | ICD-10-CM | POA: Diagnosis present

## 2015-08-28 DIAGNOSIS — M797 Fibromyalgia: Secondary | ICD-10-CM | POA: Diagnosis present

## 2015-08-28 DIAGNOSIS — G8929 Other chronic pain: Secondary | ICD-10-CM | POA: Diagnosis present

## 2015-08-28 DIAGNOSIS — J9690 Respiratory failure, unspecified, unspecified whether with hypoxia or hypercapnia: Secondary | ICD-10-CM

## 2015-08-28 DIAGNOSIS — I5032 Chronic diastolic (congestive) heart failure: Secondary | ICD-10-CM | POA: Diagnosis present

## 2015-08-28 DIAGNOSIS — I6200 Nontraumatic subdural hemorrhage, unspecified: Secondary | ICD-10-CM | POA: Diagnosis present

## 2015-08-28 DIAGNOSIS — Z794 Long term (current) use of insulin: Secondary | ICD-10-CM

## 2015-08-28 DIAGNOSIS — M542 Cervicalgia: Secondary | ICD-10-CM | POA: Diagnosis present

## 2015-08-28 DIAGNOSIS — S0232XK Fracture of orbital floor, left side, subsequent encounter for fracture with nonunion: Secondary | ICD-10-CM | POA: Diagnosis present

## 2015-08-28 DIAGNOSIS — Z8673 Personal history of transient ischemic attack (TIA), and cerebral infarction without residual deficits: Secondary | ICD-10-CM | POA: Diagnosis not present

## 2015-08-28 DIAGNOSIS — E876 Hypokalemia: Secondary | ICD-10-CM | POA: Diagnosis present

## 2015-08-28 DIAGNOSIS — E870 Hyperosmolality and hypernatremia: Secondary | ICD-10-CM | POA: Diagnosis present

## 2015-08-28 DIAGNOSIS — N390 Urinary tract infection, site not specified: Secondary | ICD-10-CM | POA: Diagnosis not present

## 2015-08-28 DIAGNOSIS — R451 Restlessness and agitation: Secondary | ICD-10-CM | POA: Diagnosis not present

## 2015-08-28 DIAGNOSIS — Z79899 Other long term (current) drug therapy: Secondary | ICD-10-CM

## 2015-08-28 DIAGNOSIS — R34 Anuria and oliguria: Secondary | ICD-10-CM | POA: Diagnosis present

## 2015-08-28 DIAGNOSIS — F418 Other specified anxiety disorders: Secondary | ICD-10-CM | POA: Diagnosis present

## 2015-08-28 DIAGNOSIS — J9601 Acute respiratory failure with hypoxia: Secondary | ICD-10-CM | POA: Diagnosis present

## 2015-08-28 DIAGNOSIS — Z91040 Latex allergy status: Secondary | ICD-10-CM | POA: Diagnosis not present

## 2015-08-28 DIAGNOSIS — R112 Nausea with vomiting, unspecified: Secondary | ICD-10-CM | POA: Diagnosis not present

## 2015-08-28 DIAGNOSIS — Z8249 Family history of ischemic heart disease and other diseases of the circulatory system: Secondary | ICD-10-CM | POA: Diagnosis not present

## 2015-08-28 DIAGNOSIS — E119 Type 2 diabetes mellitus without complications: Secondary | ICD-10-CM

## 2015-08-28 DIAGNOSIS — R1310 Dysphagia, unspecified: Secondary | ICD-10-CM | POA: Insufficient documentation

## 2015-08-28 DIAGNOSIS — E872 Acidosis: Secondary | ICD-10-CM | POA: Diagnosis present

## 2015-08-28 DIAGNOSIS — M549 Dorsalgia, unspecified: Secondary | ICD-10-CM | POA: Diagnosis present

## 2015-08-28 DIAGNOSIS — G9340 Encephalopathy, unspecified: Secondary | ICD-10-CM | POA: Diagnosis present

## 2015-08-28 DIAGNOSIS — Z87891 Personal history of nicotine dependence: Secondary | ICD-10-CM | POA: Diagnosis not present

## 2015-08-28 DIAGNOSIS — S065X9A Traumatic subdural hemorrhage with loss of consciousness of unspecified duration, initial encounter: Secondary | ICD-10-CM | POA: Diagnosis present

## 2015-08-28 DIAGNOSIS — R1110 Vomiting, unspecified: Secondary | ICD-10-CM

## 2015-08-28 DIAGNOSIS — S72009A Fracture of unspecified part of neck of unspecified femur, initial encounter for closed fracture: Secondary | ICD-10-CM

## 2015-08-28 DIAGNOSIS — E1165 Type 2 diabetes mellitus with hyperglycemia: Secondary | ICD-10-CM | POA: Diagnosis present

## 2015-08-28 DIAGNOSIS — Z9181 History of falling: Secondary | ICD-10-CM

## 2015-08-28 DIAGNOSIS — W1830XA Fall on same level, unspecified, initial encounter: Secondary | ICD-10-CM | POA: Diagnosis present

## 2015-08-28 DIAGNOSIS — I509 Heart failure, unspecified: Secondary | ICD-10-CM | POA: Diagnosis not present

## 2015-08-28 DIAGNOSIS — B370 Candidal stomatitis: Secondary | ICD-10-CM | POA: Diagnosis present

## 2015-08-28 DIAGNOSIS — S065XAA Traumatic subdural hemorrhage with loss of consciousness status unknown, initial encounter: Secondary | ICD-10-CM | POA: Diagnosis present

## 2015-08-28 DIAGNOSIS — F32A Depression, unspecified: Secondary | ICD-10-CM | POA: Diagnosis present

## 2015-08-28 DIAGNOSIS — S069XAS Unspecified intracranial injury with loss of consciousness status unknown, sequela: Secondary | ICD-10-CM | POA: Insufficient documentation

## 2015-08-28 DIAGNOSIS — J9811 Atelectasis: Secondary | ICD-10-CM | POA: Diagnosis present

## 2015-08-28 DIAGNOSIS — D75839 Thrombocytosis, unspecified: Secondary | ICD-10-CM | POA: Insufficient documentation

## 2015-08-28 DIAGNOSIS — M25552 Pain in left hip: Secondary | ICD-10-CM | POA: Diagnosis not present

## 2015-08-28 DIAGNOSIS — S069X9S Unspecified intracranial injury with loss of consciousness of unspecified duration, sequela: Secondary | ICD-10-CM

## 2015-08-28 DIAGNOSIS — E785 Hyperlipidemia, unspecified: Secondary | ICD-10-CM | POA: Diagnosis present

## 2015-08-28 DIAGNOSIS — A419 Sepsis, unspecified organism: Secondary | ICD-10-CM | POA: Diagnosis not present

## 2015-08-28 DIAGNOSIS — Z841 Family history of disorders of kidney and ureter: Secondary | ICD-10-CM | POA: Diagnosis not present

## 2015-08-28 DIAGNOSIS — S0285XA Fracture of orbit, unspecified, initial encounter for closed fracture: Secondary | ICD-10-CM

## 2015-08-28 DIAGNOSIS — Z4659 Encounter for fitting and adjustment of other gastrointestinal appliance and device: Secondary | ICD-10-CM

## 2015-08-28 DIAGNOSIS — F0630 Mood disorder due to known physiological condition, unspecified: Secondary | ICD-10-CM | POA: Insufficient documentation

## 2015-08-28 LAB — I-STAT CHEM 8, ED
BUN: 16 mg/dL (ref 6–20)
CALCIUM ION: 1.05 mmol/L — AB (ref 1.13–1.30)
CHLORIDE: 101 mmol/L (ref 101–111)
Creatinine, Ser: 0.4 mg/dL — ABNORMAL LOW (ref 0.44–1.00)
Glucose, Bld: 492 mg/dL — ABNORMAL HIGH (ref 65–99)
HCT: 50 % — ABNORMAL HIGH (ref 36.0–46.0)
HEMOGLOBIN: 17 g/dL — AB (ref 12.0–15.0)
Potassium: 4.1 mmol/L (ref 3.5–5.1)
SODIUM: 137 mmol/L (ref 135–145)
TCO2: 19 mmol/L (ref 0–100)

## 2015-08-28 LAB — RAPID URINE DRUG SCREEN, HOSP PERFORMED
Amphetamines: NOT DETECTED
Barbiturates: NOT DETECTED
Benzodiazepines: NOT DETECTED
COCAINE: NOT DETECTED
OPIATES: NOT DETECTED
TETRAHYDROCANNABINOL: POSITIVE — AB

## 2015-08-28 LAB — DIFFERENTIAL
BASOS PCT: 0 %
Basophils Absolute: 0 10*3/uL (ref 0.0–0.1)
EOS PCT: 0 %
Eosinophils Absolute: 0 10*3/uL (ref 0.0–0.7)
Lymphocytes Relative: 6 %
Lymphs Abs: 1.1 10*3/uL (ref 0.7–4.0)
MONOS PCT: 6 %
Monocytes Absolute: 1.1 10*3/uL — ABNORMAL HIGH (ref 0.1–1.0)
NEUTROS ABS: 16.3 10*3/uL — AB (ref 1.7–7.7)
Neutrophils Relative %: 88 %

## 2015-08-28 LAB — I-STAT TROPONIN, ED: TROPONIN I, POC: 0.02 ng/mL (ref 0.00–0.08)

## 2015-08-28 LAB — COMPREHENSIVE METABOLIC PANEL
ALK PHOS: 117 U/L (ref 38–126)
ALT: 13 U/L — ABNORMAL LOW (ref 14–54)
AST: 29 U/L (ref 15–41)
Albumin: 4 g/dL (ref 3.5–5.0)
Anion gap: 23 — ABNORMAL HIGH (ref 5–15)
BUN: 14 mg/dL (ref 6–20)
CALCIUM: 8.6 mg/dL — AB (ref 8.9–10.3)
CO2: 16 mmol/L — ABNORMAL LOW (ref 22–32)
Chloride: 98 mmol/L — ABNORMAL LOW (ref 101–111)
Creatinine, Ser: 0.84 mg/dL (ref 0.44–1.00)
GLUCOSE: 488 mg/dL — AB (ref 65–99)
POTASSIUM: 4.2 mmol/L (ref 3.5–5.1)
Sodium: 137 mmol/L (ref 135–145)
TOTAL PROTEIN: 6.9 g/dL (ref 6.5–8.1)
Total Bilirubin: 1.5 mg/dL — ABNORMAL HIGH (ref 0.3–1.2)

## 2015-08-28 LAB — BASIC METABOLIC PANEL
ANION GAP: 9 (ref 5–15)
ANION GAP: 10 (ref 5–15)
BUN: 12 mg/dL (ref 6–20)
BUN: 14 mg/dL (ref 6–20)
CHLORIDE: 112 mmol/L — AB (ref 101–111)
CHLORIDE: 110 mmol/L (ref 101–111)
CO2: 22 mmol/L (ref 22–32)
CO2: 23 mmol/L (ref 22–32)
CREATININE: 0.72 mg/dL (ref 0.44–1.00)
Calcium: 8 mg/dL — ABNORMAL LOW (ref 8.9–10.3)
Calcium: 8 mg/dL — ABNORMAL LOW (ref 8.9–10.3)
Creatinine, Ser: 0.72 mg/dL (ref 0.44–1.00)
GFR calc Af Amer: >60 mL/min (ref >60)
GFR calc Af Amer: >60 mL/min (ref >60)
GFR calc non Af Amer: >60 mL/min (ref >60)
GFR calc non Af Amer: >60 mL/min (ref >60)
GLUCOSE: 273 mg/dL — AB (ref 65–99)
Glucose, Bld: 189 mg/dL — ABNORMAL HIGH (ref 65–99)
POTASSIUM: 3.7 mmol/L (ref 3.5–5.1)
POTASSIUM: 3.6 mmol/L (ref 3.5–5.1)
SODIUM: 143 mmol/L (ref 135–145)
SODIUM: 143 mmol/L (ref 135–145)

## 2015-08-28 LAB — I-STAT ARTERIAL BLOOD GAS, ED
Acid-base deficit: 8 mmol/L — ABNORMAL HIGH (ref 0.0–2.0)
BICARBONATE: 18.5 meq/L — AB (ref 20.0–24.0)
O2 Saturation: 100 %
PCO2 ART: 39.7 mmHg (ref 35.0–45.0)
PH ART: 7.275 — AB (ref 7.350–7.450)
PO2 ART: 455 mmHg — AB (ref 80.0–100.0)
TCO2: 20 mmol/L (ref 0–100)

## 2015-08-28 LAB — CBC
HEMATOCRIT: 45.3 % (ref 36.0–46.0)
Hemoglobin: 15.7 g/dL — ABNORMAL HIGH (ref 12.0–15.0)
MCH: 33.1 pg (ref 26.0–34.0)
MCHC: 34.7 g/dL (ref 30.0–36.0)
MCV: 95.4 fL (ref 78.0–100.0)
Platelets: 320 10*3/uL (ref 150–400)
RBC: 4.75 MIL/uL (ref 3.87–5.11)
RDW: 12.2 % (ref 11.5–15.5)
WBC: 18.5 10*3/uL — AB (ref 4.0–10.5)

## 2015-08-28 LAB — I-STAT CG4 LACTIC ACID, ED
LACTIC ACID, VENOUS: 2.08 mmol/L — AB (ref 0.5–2.0)
Lactic Acid, Venous: 4.35 mmol/L (ref 0.5–2.0)

## 2015-08-28 LAB — GLUCOSE, CAPILLARY
GLUCOSE-CAPILLARY: 345 mg/dL — AB (ref 65–99)
GLUCOSE-CAPILLARY: 329 mg/dL — AB (ref 65–99)
GLUCOSE-CAPILLARY: 256 mg/dL — AB (ref 65–99)
GLUCOSE-CAPILLARY: 244 mg/dL — AB (ref 65–99)
Glucose-Capillary: 270 mg/dL — ABNORMAL HIGH (ref 65–99)
Glucose-Capillary: 214 mg/dL — ABNORMAL HIGH (ref 65–99)

## 2015-08-28 LAB — URINE MICROSCOPIC-ADD ON: RBC / HPF: NONE SEEN RBC/hpf (ref 0–5)

## 2015-08-28 LAB — URINALYSIS, ROUTINE W REFLEX MICROSCOPIC
Bilirubin Urine: NEGATIVE
Glucose, UA: >1000 mg/dL — AB
Ketones, ur: >80 mg/dL — AB
LEUKOCYTES UA: NEGATIVE
NITRITE: NEGATIVE
PH: 5 (ref 5.0–8.0)
Protein, ur: NEGATIVE mg/dL
Specific Gravity, Urine: 1.034 — ABNORMAL HIGH (ref 1.005–1.030)

## 2015-08-28 LAB — INFLUENZA PANEL BY PCR (TYPE A & B)
H1N1 flu by pcr: NOT DETECTED
Influenza A By PCR: NEGATIVE
Influenza B By PCR: NEGATIVE

## 2015-08-28 LAB — PROTIME-INR
INR: 1.17 (ref 0.00–1.49)
PROTHROMBIN TIME: 15.1 s (ref 11.6–15.2)

## 2015-08-28 LAB — APTT: aPTT: 24 seconds (ref 24–37)

## 2015-08-28 LAB — MRSA PCR SCREENING: MRSA by PCR: NEGATIVE

## 2015-08-28 LAB — ETHANOL

## 2015-08-28 LAB — LACTIC ACID, PLASMA: Lactic Acid, Venous: 2.5 mmol/L (ref 0.5–2.0)

## 2015-08-28 MED ORDER — SODIUM CHLORIDE 0.9 % IV SOLN
Freq: Once | INTRAVENOUS | Status: AC
  Administered 2015-08-28: 13:00:00 via INTRAVENOUS

## 2015-08-28 MED ORDER — CHLORHEXIDINE GLUCONATE 0.12% ORAL RINSE (MEDLINE KIT)
15.0000 mL | Freq: Two times a day (BID) | OROMUCOSAL | Status: DC
  Administered 2015-08-28 – 2015-09-02 (×10): 15 mL via OROMUCOSAL

## 2015-08-28 MED ORDER — DEXMEDETOMIDINE HCL IN NACL 200 MCG/50ML IV SOLN
0.0000 ug/kg/h | INTRAVENOUS | Status: DC
  Filled 2015-08-28: qty 50

## 2015-08-28 MED ORDER — PIPERACILLIN-TAZOBACTAM 3.375 G IVPB: 3.3750 g | Freq: Three times a day (TID) | INTRAVENOUS | Status: DC

## 2015-08-28 MED ORDER — SODIUM CHLORIDE 0.9 % IV SOLN
250.0000 mL | INTRAVENOUS | Status: DC | PRN
Start: 2015-08-28 — End: 2015-09-06

## 2015-08-28 MED ORDER — VANCOMYCIN HCL IN DEXTROSE 1-5 GM/200ML-% IV SOLN
1000.0000 mg | Freq: Once | INTRAVENOUS | Status: AC
  Administered 2015-08-28: 1000 mg via INTRAVENOUS
  Filled 2015-08-28: qty 200

## 2015-08-28 MED ORDER — PROPOFOL 1000 MG/100ML IV EMUL
INTRAVENOUS | Status: AC
  Administered 2015-08-28: 1000 mg
  Filled 2015-08-28: qty 100

## 2015-08-28 MED ORDER — SODIUM CHLORIDE 0.9 % IV SOLN
INTRAVENOUS | Status: DC
  Administered 2015-08-28: 2.9 [IU]/h via INTRAVENOUS
  Filled 2015-08-28: qty 2.5

## 2015-08-28 MED ORDER — ETOMIDATE 2 MG/ML IV SOLN
INTRAVENOUS | Status: AC | PRN
  Administered 2015-08-28: 20 mg via INTRAVENOUS

## 2015-08-28 MED ORDER — PROPOFOL 1000 MG/100ML IV EMUL
5.0000 ug/kg/min | Freq: Once | INTRAVENOUS | Status: DC
  Administered 2015-08-28: 10 ug/kg/min via INTRAVENOUS

## 2015-08-28 MED ORDER — PIPERACILLIN-TAZOBACTAM 3.375 G IVPB 30 MIN
3.3750 g | Freq: Once | INTRAVENOUS | Status: AC
  Administered 2015-08-28: 3.375 g via INTRAVENOUS
  Filled 2015-08-28: qty 50

## 2015-08-28 MED ORDER — FENTANYL CITRATE (PF) 100 MCG/2ML IJ SOLN
50.0000 ug | INTRAMUSCULAR | Status: DC | PRN
  Administered 2015-08-28 (×2): 50 ug via INTRAVENOUS
  Filled 2015-08-28 (×3): qty 2

## 2015-08-28 MED ORDER — VANCOMYCIN HCL IN DEXTROSE 1-5 GM/200ML-% IV SOLN
1000.0000 mg | Freq: Two times a day (BID) | INTRAVENOUS | Status: DC
  Filled 2015-08-28: qty 200

## 2015-08-28 MED ORDER — SODIUM CHLORIDE 0.9 % IV BOLUS (SEPSIS)
500.0000 mL | Freq: Once | INTRAVENOUS | Status: AC
  Administered 2015-08-28: 500 mL via INTRAVENOUS

## 2015-08-28 MED ORDER — DEXTROSE-NACL 5-0.45 % IV SOLN
INTRAVENOUS | Status: DC
  Administered 2015-08-28 – 2015-08-29 (×2): via INTRAVENOUS

## 2015-08-28 MED ORDER — SODIUM CHLORIDE 0.9 % IV BOLUS (SEPSIS)
500.0000 mL | INTRAVENOUS | Status: AC
  Administered 2015-08-28: 500 mL via INTRAVENOUS

## 2015-08-28 MED ORDER — ANTISEPTIC ORAL RINSE SOLUTION (CORINZ)
7.0000 mL | Freq: Four times a day (QID) | OROMUCOSAL | Status: DC
  Administered 2015-08-28: 7 mL via OROMUCOSAL

## 2015-08-28 MED ORDER — SODIUM CHLORIDE 0.9 % IV SOLN
INTRAVENOUS | Status: DC
  Administered 2015-08-28: 17:00:00 via INTRAVENOUS

## 2015-08-28 MED ORDER — SODIUM CHLORIDE 0.9 % IV SOLN
1000.0000 mg | INTRAVENOUS | Status: AC
  Administered 2015-08-28: 1000 mg via INTRAVENOUS
  Filled 2015-08-28 (×2): qty 10

## 2015-08-28 MED ORDER — FENTANYL CITRATE (PF) 100 MCG/2ML IJ SOLN
50.0000 ug | INTRAMUSCULAR | Status: DC | PRN
  Administered 2015-08-28 (×2): 50 ug via INTRAVENOUS
  Filled 2015-08-28: qty 2

## 2015-08-28 MED ORDER — SODIUM CHLORIDE 0.9 % IV SOLN: INTRAVENOUS | Status: DC

## 2015-08-28 MED ORDER — POTASSIUM CHLORIDE 10 MEQ/100ML IV SOLN
10.0000 meq | INTRAVENOUS | Status: AC
  Administered 2015-08-28 (×2): 10 meq via INTRAVENOUS
  Filled 2015-08-28 (×2): qty 100

## 2015-08-28 MED ORDER — SUCCINYLCHOLINE CHLORIDE 20 MG/ML IJ SOLN
INTRAMUSCULAR | Status: AC | PRN
  Administered 2015-08-28: 120 mg via INTRAVENOUS

## 2015-08-28 MED ORDER — SODIUM CHLORIDE 0.9 % IV SOLN
0.0000 ug/h | INTRAVENOUS | Status: DC
  Administered 2015-08-28: 50 ug/h via INTRAVENOUS
  Administered 2015-08-29: 125 ug/h via INTRAVENOUS
  Administered 2015-08-30 – 2015-08-31 (×3): 200 ug/h via INTRAVENOUS
  Filled 2015-08-28 (×6): qty 50

## 2015-08-28 MED ORDER — ACETAMINOPHEN 160 MG/5ML PO SOLN
650.0000 mg | ORAL | Status: DC | PRN
  Administered 2015-08-28 – 2015-08-31 (×3): 650 mg
  Filled 2015-08-28 (×3): qty 20.3

## 2015-08-28 MED ORDER — PROPOFOL 1000 MG/100ML IV EMUL
5.0000 ug/kg/min | INTRAVENOUS | Status: DC
  Administered 2015-08-28 (×2): 50 ug/kg/min via INTRAVENOUS
  Filled 2015-08-28 (×2): qty 100

## 2015-08-28 MED ORDER — PIPERACILLIN-TAZOBACTAM 3.375 G IVPB
3.3750 g | Freq: Three times a day (TID) | INTRAVENOUS | Status: DC
Start: 2015-08-28 — End: 2015-09-02
  Administered 2015-08-28 – 2015-09-02 (×15): 3.375 g via INTRAVENOUS
  Filled 2015-08-28 (×19): qty 50

## 2015-08-28 MED ORDER — PROPOFOL 1000 MG/100ML IV EMUL
INTRAVENOUS | Status: AC
  Administered 2015-08-28: 10 ug/kg/min via INTRAVENOUS
  Filled 2015-08-28: qty 100

## 2015-08-28 MED ORDER — ACETAMINOPHEN 650 MG RE SUPP
650.0000 mg | Freq: Once | RECTAL | Status: AC
  Administered 2015-08-28: 650 mg via RECTAL
  Filled 2015-08-28: qty 1

## 2015-08-28 MED ORDER — PANTOPRAZOLE SODIUM 40 MG IV SOLR
40.0000 mg | Freq: Every day | INTRAVENOUS | Status: DC
  Administered 2015-08-28: 40 mg via INTRAVENOUS
  Filled 2015-08-28: qty 40

## 2015-08-28 MED ORDER — SODIUM CHLORIDE 0.9 % IV SOLN
500.0000 mg | Freq: Two times a day (BID) | INTRAVENOUS | Status: DC
  Administered 2015-08-29 – 2015-09-05 (×15): 500 mg via INTRAVENOUS
  Filled 2015-08-28 (×17): qty 5

## 2015-08-28 MED ORDER — ACETAMINOPHEN 325 MG PO TABS: 650.0000 mg | ORAL_TABLET | Freq: Once | ORAL | Status: DC

## 2015-08-28 MED ORDER — SODIUM CHLORIDE 0.9 % IV BOLUS (SEPSIS)
1000.0000 mL | INTRAVENOUS | Status: AC
  Administered 2015-08-28 (×2): 1000 mL via INTRAVENOUS

## 2015-08-28 NOTE — ED Notes (Signed)
Son at bedside; he reports she lives with the daughter. He reports poor control of medical conditions and increase in dementia-like behavior. He reports his sister is not the best caretaker. His sister told him she was confused and combative this morning since 0330, walking through house naked, and fell a few times. He was not present at the time. He said his sister called him and he went by the house; the bed was saturated with urine and their was vomit in the bed.

## 2015-08-28 NOTE — Consult Note (Signed)
CC:  Chief Complaint  Patient presents with  . Altered Mental Status    HPI: Margaret Fischer is a 75 y.o. female with left acute subdural hematoma and altered mental status.  She has had symptoms of a UTI for several days.  She became confused yesterday and fell and struck her head.  She subsequently became more confused and agitated.  She was brought to the ED for evaluation and was combative.  She was found to be septic.  She had a head CT which showed a left sided subdural hematoma with minimal midline shift.  She was intubated for agitation.  She has been moving both sides briskly and opening her eyes since intubation.  PMH: Past Medical History  Diagnosis Date  . Diabetes mellitus     Insulin dependent  . Chest pain, non-cardiac     History of 2 normal cardiac catheterizations  . Bradycardia   . Dizziness     Chronic  . Fibromyalgia   . Neuropathy ([place])   . Chronic chest pain   . Hyperlipidemia   . Chronic neck pain   . Chronic back pain   . Basal cell carcinoma of nose   . Depression   . Thyroid cyst   . Goiter   . 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 Hypertension   . Depression   . Anxiety   . Migraine     PSH: Past Surgical History  Procedure Laterality Date  . Total knee arthroplasty    . Cardiac catheterization      History of 2 caths, reportedly normal  . Joint replacement    . Rotator cuff repair    . Appendectomy    . Cystectomy    . Thyroid surgery    . Breast lumpectomy    . Abdominal hysterectomy    . Biceps tendon repair    . Refractive surgery    . Right nares  09/2011    basal cell surgery  . Cholecystectomy    . Vesicovaginal fistula closure w/ tah    . Lasik      SH: Social History  Substance Use Topics  . Smoking status: Former Smoker    Quit date: 12/25/1983  . Smokeless tobacco: Never Used  . Alcohol Use: No    MEDS: Prior to Admission medications   Medication Sig Start Date End Date Taking? Authorizing Provider  clonazePAM  (KLONOPIN) 2 MG tablet Take 2 mg by mouth 3 (three) times daily as needed for anxiety.   Yes Historical Provider, MD  gabapentin (NEURONTIN) 600 MG tablet Take 600 mg by mouth 4 (four) times daily.   Yes Historical Provider, MD  insulin detemir (LEVEMIR) 100 UNIT/ML injection Inject 0.3 mLs (30 Units total) into the skin 2 (two) times daily. 09/01/13  Yes Bobby Rumpf [place], PA-C  metFORMIN (GLUCOPHAGE) 1000 MG tablet Take 1,000 mg by mouth 2 (two) times daily with a meal.   Yes Historical Provider, MD  nitroGLYCERIN (NITROSTAT) 0.4 MG SL tablet Place 0.4 mg under the tongue every 5 (five) minutes as needed for chest pain.   Yes Historical Provider, MD  amitriptyline (ELAVIL) 10 MG tablet TAKE ONE & ONE-HALF TABLETS BY MOUTH ONCE DAILY AT BEDTIME 10/01/14   Kathrynn Ducking, MD  amLODipine (NORVASC) 10 MG tablet Take 1 tablet (10 mg total) by mouth daily. 12/08/13   Olam Idler, MD  aspirin 325 MG tablet Take 325 mg by mouth 2 (two) times daily.  Historical Provider, MD  Cholecalciferol (VITAMIN D-3 PO) Take by mouth daily.      Historical Provider, MD  Cyanocobalamin (B-12 PO) Take 1 tablet by mouth daily.    Historical Provider, MD  diclofenac (VOLTAREN) 75 MG EC tablet Take 75 mg by mouth 2 (two) times daily.    Historical Provider, MD  FLUoxetine (PROZAC) 40 MG capsule Take 80 mg by mouth daily.     Historical Provider, MD  glucosamine-chondroitin 500-400 MG tablet Take 1 tablet by mouth 2 (two) times daily.      Historical Provider, MD  lisinopril (PRINIVIL,ZESTRIL) 20 MG tablet Take 1 tablet (20 mg total) by mouth daily. 12/08/13   Olam Idler, MD  potassium chloride (KLOR-CON) 20 MEQ packet Take 20 mEq by mouth 2 (two) times daily.    Historical Provider, MD  pravastatin (PRAVACHOL) 40 MG tablet Take 1 tablet (40 mg total) by mouth daily. 09/01/13   Melton Alar, PA-C    ALLERGY: Allergies  Allergen Reactions  . Epinephrine Other (See Comments)    Tachycardia when used as a local  anesthetic  . Hydromorphone Other (See Comments)    Unknown reaction  . Morphine And Related Other (See Comments)    Unknown reaction  . Phenergan [Promethazine Hcl] Other (See Comments)    " GOES WACK YOU WITH THIS MEDICATION" STATEMENT MADE BY PATIENT  . Succinylcholine Other (See Comments)    Unknown reaction  . Latex Rash    ROS: ROS  NEUROLOGIC EXAM: Intubated and sedated Opens eyes bilaterally Moves both sides purposefully with full strength  IMGAING: CT Head: Left frontal and temporal SDH, 1.3 cm at thickest but relatively small and with only 3-4 mm of shift.  On repeat scan the hematoma was unchanged and shift was slightly improved.  IMPRESSION: - 75 y.o. female with sepsis and acute left subdural hematoma.  I don't believe her altered mental status is due to her SDH.  I think it is primarily due to her infectious process and sepsis.  I'm optimistic she can be managed non-operatively.  I'm going to order a repeat CT for the morning and start her on Keppra.  PLAN: - CT Head at 0400 tomorrow - Keppra 500 mg IV bid - Medical management per PCCM

## 2015-08-28 NOTE — Progress Notes (Signed)
[facility] Progress Note Patient Name: Margaret Fischer DOB: 11-25-1940 MRN: [id number]   Date of Service  08/28/2015  HPI/Events of Note  agitation  eICU Interventions  Propofol gtt     Intervention Category Major Interventions: Delirium, psychosis, severe agitation - evaluation and management  [name] V. 08/28/2015, 5:25 PM

## 2015-08-28 NOTE — ED Provider Notes (Signed)
INTUBATION Performed by: Elmer Ramp  Required items: required blood products, implants, devices, and special equipment available Patient identity confirmed: provided demographic data and hospital-assigned identification number Time out: Immediately prior to procedure a "time out" was called to verify the correct patient, procedure, equipment, support staff and site/side marked as required.  Indications: Inability to protect airway, patient pulling out IVs.   Intubation method: Glidescope Laryngoscopy   Preoxygenation: Nonrebreather   Sedatives: Etomidate Paralytic: Succinylcholine  Tube Size: 7.5 cuffed  Post-procedure assessment: chest rise and ETCO2 monitor Breath sounds: equal and absent over the epigastrium Tube secured with: ETT holder Chest x-ray interpreted by radiologist and me.  Chest x-ray findings: endotracheal tube in appropriate position  Patient tolerated the procedure well with no immediate complications.    Okey Regal, PA-C 08/28/15 1454  Pattricia Boss, MD 08/28/15 1537

## 2015-08-28 NOTE — ED Notes (Signed)
Family at bedside. 

## 2015-08-28 NOTE — Progress Notes (Signed)
Dr Elsworth Soho notified of pts lactic acid level of 2.5. No new orders received will continue to monitor.

## 2015-08-28 NOTE — Progress Notes (Signed)
eLink Physician-Brief Progress Note Patient Name: Margaret Fischer DOB: 1940/09/19 MRN: [id number]   Date of Service  08/28/2015  HPI/Events of Note  hypotenwive after propofol gtt  eICU Interventions  NS bolus Use fent gtt instead     Intervention Category Major Interventions: Hypotension - evaluation and management  Margaret Fischer V. 08/28/2015, 10:21 PM

## 2015-08-28 NOTE — ED Notes (Signed)
Attempted report 

## 2015-08-28 NOTE — Progress Notes (Signed)
Pt transported to CT on vent, no complications  

## 2015-08-28 NOTE — ED Notes (Signed)
Neuro surgery at bedside talking with famil;y

## 2015-08-28 NOTE — Progress Notes (Signed)
Transported pt from ED Trauma A to 3M13 on ventilator. Pt stable throughout with no complications, but was restless and reaching for her tube. Pt had safety mitts placed upon arrival to her assigned room. RT will continue to monitor

## 2015-08-28 NOTE — Progress Notes (Signed)
Pt transported to CT without complication.  

## 2015-08-28 NOTE — ED Provider Notes (Signed)
CSN: [id number]     Arrival date & time 08/28/15  [id number] History   First MD Initiated Contact with Patient 08/28/15 1006     Chief Complaint  Patient presents with  . Altered Mental Status    Level V caveat secondary to altered mental status (Consider location/radiation/quality/duration/timing/severity/associated sxs/prior Treatment) Patient is a 75 y.o. female presenting with altered mental status.  Altered Mental Status  75 year old female presents via EMS with reports that she is confused. They report that patient's family reported that she was not feeling well yesterday. She had several falls. She got out of bed this morning and tried to come to the breakfast table. She then became acutely altered with vomiting and stooling. All history is obtained via EMS and family is not present on my initial evaluation. Patient unable to give any history. A report prehospital blood sugar at 150. EMS reports that there is a strong smell of urine about the patient in the house. Review of previous medical records reveal previous admission for encephalopathy related to fever. They report the patient has been very agitated prehospital and attempted to pull out her IVs. Oxygen saturations were in the low 80s but they were having difficulty keeping oxygen on the patient. Patient has been unable to answer questions but is confused and agitated report prehospital blood sugar greater than 500 Past Medical History  Diagnosis Date  . Diabetes mellitus     Insulin dependent  . Chest pain, non-cardiac     History of 2 normal cardiac catheterizations  . Bradycardia   . Dizziness     Chronic  . Fibromyalgia   . Neuropathy ([place])   . Chronic chest pain   . Hyperlipidemia   . Chronic neck pain   . Chronic back pain   . Basal cell carcinoma of nose   . Depression   . Thyroid cyst   . Goiter   . 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 Hypertension   . Depression   . Anxiety   . Migraine    Past Surgical  History  Procedure Laterality Date  . Total knee arthroplasty    . Cardiac catheterization      History of 2 caths, reportedly normal  . Joint replacement    . Rotator cuff repair    . Appendectomy    . Cystectomy    . Thyroid surgery    . Breast lumpectomy    . Abdominal hysterectomy    . Biceps tendon repair    . Refractive surgery    . Right nares  09/2011    basal cell surgery  . Cholecystectomy    . Vesicovaginal fistula closure w/ tah    . Lasik     Family History  Problem Relation Age of Onset  . Lymphoma Mother   . Diabetic kidney disease Mother   . Diabetic kidney disease Brother   . Heart disease Sister   . Cancer Sister   . Heart attack Brother   . Myasthenia gravis Brother    Social History  Substance Use Topics  . Smoking status: Former Smoker    Quit date: 12/25/1983  . Smokeless tobacco: Never Used  . Alcohol Use: No   OB History    No data available     Review of Systems  Unable to perform ROS: Acuity of condition      Allergies  Epinephrine; Hydromorphone; Morphine and related; Phenergan; Succinylcholine; and Latex  Home Medications   Prior  to Admission medications   Medication Sig Start Date End Date Taking? Authorizing Provider  amitriptyline (ELAVIL) 10 MG tablet TAKE ONE & ONE-HALF TABLETS BY MOUTH ONCE DAILY AT BEDTIME 10/01/14   Kathrynn Ducking, MD  amLODipine (NORVASC) 10 MG tablet Take 1 tablet (10 mg total) by mouth daily. 12/08/13   Olam Idler, MD  aspirin 325 MG tablet Take 325 mg by mouth 2 (two) times daily.     Historical Provider, MD  Cholecalciferol (VITAMIN D-3 PO) Take by mouth daily.      Historical Provider, MD  Cyanocobalamin (B-12 PO) Take 1 tablet by mouth daily.    Historical Provider, MD  diclofenac (VOLTAREN) 75 MG EC tablet Take 75 mg by mouth 2 (two) times daily.    Historical Provider, MD  FLUoxetine (PROZAC) 40 MG capsule Take 80 mg by mouth daily.     Historical Provider, MD  glucosamine-chondroitin 500-400  MG tablet Take 1 tablet by mouth 2 (two) times daily.      Historical Provider, MD  insulin detemir (LEVEMIR) 100 UNIT/ML injection Inject 0.3 mLs (30 Units total) into the skin 2 (two) times daily. 09/01/13   Bobby Rumpf York, PA-C  lisinopril (PRINIVIL,ZESTRIL) 20 MG tablet Take 1 tablet (20 mg total) by mouth daily. 12/08/13   Olam Idler, MD  metFORMIN (GLUCOPHAGE) 1000 MG tablet Take 1,000 mg by mouth 2 (two) times daily with a meal.    Historical Provider, MD  nitroGLYCERIN (NITROSTAT) 0.4 MG SL tablet Place 0.4 mg under the tongue every 5 (five) minutes as needed for chest pain.    Historical Provider, MD  potassium chloride (KLOR-CON) 20 MEQ packet Take 20 mEq by mouth 2 (two) times daily.    Historical Provider, MD  pravastatin (PRAVACHOL) 40 MG tablet Take 1 tablet (40 mg total) by mouth daily. 09/01/13   Marianne L York, PA-C   BP 168/78 mmHg  Pulse 111  Resp 14  SpO2 99% Physical Exam  Constitutional: She appears well-developed. She appears distressed.  HENT:  Head: Normocephalic.    Right Ear: External ear normal.  Left Ear: External ear normal.  Nose: Nose normal.  Mouth/Throat: Oropharynx is clear and moist. Abnormal dentition.  Mucous membranes are dry A edentulous  Eyes: Conjunctivae are normal. Pupils are equal, round, and reactive to light.  Neck: Normal range of motion.  Cardiovascular: Intact distal pulses.   Tachycardic  Pulmonary/Chest: Effort normal. She has wheezes.  Abdominal: Soft.  Musculoskeletal: She exhibits no edema or tenderness.  Multiple contusions bearing colors bilateral upper extremities  Neurological: She has normal strength. GCS eye subscore is 2. GCS verbal subscore is 1. GCS motor subscore is 4.  Patient appears to move all extremities well and equally. She is agitated and combative but I'm unable to assess if this is in response to painful stimuli and has no apparent response to verbal stimuli  Skin: Skin is warm and dry.  Vitals  reviewed.   ED Course  Procedures (including critical care time) Labs Review Labs Reviewed  CBC - Abnormal; Notable for the following:    WBC 18.5 (*)    Hemoglobin 15.7 (*)    All other components within normal limits  DIFFERENTIAL - Abnormal; Notable for the following:    Neutro Abs 16.3 (*)    Monocytes Absolute 1.1 (*)    All other components within normal limits  COMPREHENSIVE METABOLIC PANEL - Abnormal; Notable for the following:    Chloride 98 (*)  CO2 16 (*)    Glucose, Bld 488 (*)    Calcium 8.6 (*)    ALT 13 (*)    Total Bilirubin 1.5 (*)    Anion gap 23 (*)    All other components within normal limits  URINE RAPID DRUG SCREEN, HOSP PERFORMED - Abnormal; Notable for the following:    Tetrahydrocannabinol POSITIVE (*)    All other components within normal limits  URINALYSIS, ROUTINE W REFLEX MICROSCOPIC (NOT AT [facility]) - Abnormal; Notable for the following:    Specific Gravity, Urine 1.034 (*)    Glucose, UA >1000 (*)    Hgb urine dipstick TRACE (*)    Ketones, ur >80 (*)    All other components within normal limits  URINE MICROSCOPIC-ADD ON - Abnormal; Notable for the following:    Squamous Epithelial / LPF 0-5 (*)    Bacteria, UA RARE (*)    All other components within normal limits  I-STAT CHEM 8, ED - Abnormal; Notable for the following:    Creatinine, Ser 0.40 (*)    Glucose, Bld 492 (*)    Calcium, Ion 1.05 (*)    Hemoglobin 17.0 (*)    HCT 50.0 (*)    All other components within normal limits  I-STAT CG4 LACTIC ACID, ED - Abnormal; Notable for the following:    Lactic Acid, Venous 4.35 (*)    All other components within normal limits  I-STAT ARTERIAL BLOOD GAS, ED - Abnormal; Notable for the following:    pH, Arterial 7.275 (*)    pO2, Arterial 455.0 (*)    Bicarbonate 18.5 (*)    Acid-base deficit 8.0 (*)    All other components within normal limits  CULTURE, BLOOD (ROUTINE X 2)  CULTURE, BLOOD (ROUTINE X 2)  ETHANOL  PROTIME-INR  APTT  BLOOD  GAS, ARTERIAL  I-STAT TROPOININ, ED  I-STAT TROPOININ, ED    Imaging Review Ct Head Wo Contrast  08/28/2015  CLINICAL DATA:  75 year old female with altered mental status and agitated EXAM: CT HEAD WITHOUT CONTRAST TECHNIQUE: Contiguous axial images were obtained from the base of the skull through the vertex without intravenous contrast. COMPARISON:  Prior head CT 03/11/2014 FINDINGS: Acute crescentic extra-axial high attenuation fluid overlying the left cerebral convexity consistent with acute subdural hematoma. The maximal depth at the mid temporal lobe is approximately 13 mm. Additionally, there is likely some focal subdural blood overlying the right frontal lobe as well although this is in a region of combined streak and beam hardening artifact. No evidence of mass, mass effect, hydrocephalus or acute infarct. No intraventricular or subarachnoid hemorrhage visualized. Approximately 4 mm left-to-right midline shift. Motion artifact slightly limits evaluation for subtle calvarial fracture. No definite displaced calvarial fracture identified. Grossly normally aerated mastoid air cells and paranasal sinuses save for a small amount of hemo sinus layering in the left maxillary sinus. Incompletely imaged soft tissue contusion overlying the left maxillary antrum. There is slight irregularity of the inferior wall of the left orbit. Fracture is not excluded. IMPRESSION: 1. Acute subdural hematoma overlying the left cerebral convex [place] measuring up to 13 mm in width. There is associated local mass effect with 4 mm left-to-right midline shift. 2. Additional small volume subdural hematoma versus artifact overlying the right frontal lobe. 3. Incompletely imaged soft tissue contusion overlying the left maxillary antrum with associated small volume left maxillary hemo sinus. 4. Question of possible left orbital floor fracture which is incompletely imaged. Recommend further evaluation with dedicated CT scan of  the face  when the patient is able to cooperate and there is less potential for severe motion artifact. Critical Value/emergent results were called by telephone at the time of interpretation on 08/28/2015 at 11:55 am to Dr. Pattricia Boss , who verbally acknowledged these results. Electronically Signed   By: Jacqulynn Cadet M.D.   On: 08/28/2015 11:56   Dg Chest Portable 1 View  08/28/2015  CLINICAL DATA:  Patient status post intubation. Loss of consciousness. EXAM: PORTABLE CHEST 1 VIEW COMPARISON:  Chest radiograph 01/27/2015. FINDINGS: ET tube terminates in the mid trachea. Stable cardiomegaly. Minimal heterogeneous opacities left lung base. No pleural effusion or pneumothorax. Old left lateral rib fracture. IMPRESSION: ET tube terminates in mid trachea. Probable left basilar atelectasis. Electronically Signed   By: Lovey Newcomer M.D.   On: 08/28/2015 10:39   I have personally reviewed and evaluated these images and lab results as part of my medical decision-making.   EKG Interpretation   Date/Time:  Sunday August 28 2015 10:05:53 EST Ventricular Rate:  77 PR Interval:  154 QRS Duration: 97 QT Interval:  401 QTC Calculation: 454 R Axis:   39 Text Interpretation:  Normal sinus rhythm LVH by voltage Non-specific ST-t  changes lateral leads Confirmed by Shrihaan Porzio MD, Andee Poles [id number]) on 08/28/2015  10:17:40 AM      MDM   Final diagnoses:  None   patient intubated after initial evaluation due to low sats and inability to protect airway as well as protection for procedures including IV starts. After initial evaluation, temperature of 101.2 was noted. Patient is given antibiotics per sepsis protocol. Head CT is currently pending.  1-altered mental status- ct with significant sdh discussed with radiology, discussed with Dr. Cyndy Freeze and he will contact admitting team after ct review.  2- sepsis- patient febrile with elevated lactic acid.  She is tacycardiac, febrile with leukocytosis but has had normo to  hypertensive with exception of one sbp of 95.  Fluids and antibiotics ordered. 3- hyperglycemia, possible dka-fluid  Bolus and insulin drip orders entered.  Sepsis - Repeat Assessment  Performed at:    11:49 AM   Vitals     Blood pressure 131/82, pulse 117, temperature 102 F (38.9 C), temperature source Core (Comment), resp. rate 18, height 5\' 6"  (1.676 m), weight 75.1 kg, SpO2 100 %.  Heart:     Tachycardic  Lungs:    CTA  Capillary Refill:   <2 sec  Peripheral Pulse:   Radial pulse palpable  Skin:     Mottled   Discussed with DR. McQuaid and will see to admit.  CRITICAL CARE Performed by: Shaune Pollack Total critical care time: 75 minutes Critical care time was exclusive of separately billable procedures and treating other patients. Critical care was necessary to treat or prevent imminent or life-threatening deterioration. Critical care was time spent personally by me on the following activities: development of treatment plan with patient and/or surrogate as well as nursing, discussions with consultants, evaluation of patient's response to treatment, examination of patient, obtaining history from patient or surrogate, ordering and performing treatments and interventions, ordering and review of laboratory studies, ordering and review of radiographic studies, pulse oximetry and re-evaluation of patient's condition.     Pattricia Boss, MD 08/28/15 1229

## 2015-08-28 NOTE — Consult Note (Signed)
Images, lab, history reviewed.  Relatively small subdural hematoma without much midline shift.  Suspect altered mental status is due to active medical issues.  Will repeat CT head in 2 hours.  If stable will allow time to assess response to medical management.

## 2015-08-28 NOTE — Progress Notes (Signed)
Pharmacy Code Sepsis Protocol  Time of code sepsis page: 10:49 Time of antibiotic delivery: 10:55  Were antibiotics ordered at the time of the code sepsis page? Yes Was it required to contact the physician? []  Physician not contacted []  Physician contacted to order antibiotics for code sepsis []  Physician contacted to recommend changing antibiotics  Pharmacy consulted for: vancomycin and Zosyn  Anti-infectives    Start     Dose/Rate Route Frequency Ordered Stop   08/28/15 2300  vancomycin (VANCOCIN) IVPB 1000 mg/200 mL premix     1,000 mg 200 mL/hr over 60 Minutes Intravenous Every 12 hours 08/28/15 1113     08/28/15 1400  piperacillin-tazobactam (ZOSYN) IVPB 3.375 g     3.375 g 12.5 mL/hr over 240 Minutes Intravenous 3 times per day 08/28/15 1113     08/28/15 1045  piperacillin-tazobactam (ZOSYN) IVPB 3.375 g     3.375 g 100 mL/hr over 30 Minutes Intravenous  Once 08/28/15 1031     08/28/15 1045  vancomycin (VANCOCIN) IVPB 1000 mg/200 mL premix     1,000 mg 200 mL/hr over 60 Minutes Intravenous  Once 08/28/15 1031        @infusions @   Nurse education provided: []  Minutes left to administer antibiotics to achieve 1 hour goal []  Correct order of antibiotic administration []  Antibiotic Y-site compatibilities    Delivered by Angela Burke.  [place], Pharm.D., BCPS Clinical Pharmacist Pager: [phone] 08/28/2015 11:26 AM

## 2015-08-28 NOTE — ED Notes (Signed)
From home; daughter reports not feeling well and urinating frequently yesterday. Today woke up confused, combative, urinating and defecating on self. Bruises scattered on body, knot on left forehead. EMS reports family poor historians. Arrives to ED combative and fighting all care, alert and moaning. Not answering questions.

## 2015-08-28 NOTE — ED Notes (Signed)
Keppra not arrived yet from pharmacy at time of transfer.

## 2015-08-28 NOTE — H&P (Signed)
PULMONARY / CRITICAL CARE MEDICINE   Name: Margaret Fischer MRN: [id number] DOB: Feb 10, 1941    ADMISSION DATE:  08/28/2015 CONSULTATION DATE:  08/28/15  REFERRING MD:  ED  CHIEF COMPLAINT:  Altered Mental Status  HISTORY OF PRESENT ILLNESS:   Margaret Fischer is a 75 y.o. female w/ PMHx of HTN, HLD, DM type II, Migraines, depression, and anxiety, presents to the ED by EMS w/ complaints of confusion. Per chart review and sn, patient has been somewhat confused over the past few days (lives with sister/daughhter) and was apparently naked and further confused this AM. She was found to have stool and vomit in her bed today. Son states that the patient thought she had a UTI a few days ago as this has been an issue for herr in the past. She also reportedly fell, hitting her left face and head on her fall. EMS was called, taken to the ED. Of note, patient was noted to have CBG's in the 400's, lactic acidosis of 4.5, fever, tachycardia, and CT head showing left subdural hematoma.    PAST MEDICAL HISTORY :  She  has a past medical history of Diabetes mellitus; Chest pain, non-cardiac; Bradycardia; Dizziness; Fibromyalgia; Neuropathy ([place]); Chronic chest pain; Hyperlipidemia; Chronic neck pain; Chronic back pain; Basal cell carcinoma of nose; Depression; Thyroid cyst; Goiter; Stroke ([place]) (2000); Hypertension; Depression; Anxiety; and Migraine.  PAST SURGICAL HISTORY: She  has past surgical history that includes Total knee arthroplasty; Cardiac catheterization; Joint replacement; Rotator cuff repair; Appendectomy; Cystectomy; Thyroid surgery; Breast lumpectomy; Abdominal hysterectomy; Biceps tendon repair; Refractive surgery; right nares (09/2011); Cholecystectomy; Vesicovaginal fistula closure w/ TAH; and LASIK.  Allergies  Allergen Reactions  . Epinephrine     Tachycardia when used as a local anesthetic  . Hydromorphone     UNKNOWN  . Morphine And Related     UNKNOWN  . Phenergan [Promethazine Hcl]      " GOES WACK YOU WITH THIS MEDICATION" STATEMENT MADE BY PATIENT  . Succinylcholine Other (See Comments)    unknown  . Latex Rash    No current facility-administered medications on file prior to encounter.   Current Outpatient Prescriptions on File Prior to Encounter  Medication Sig  . amitriptyline (ELAVIL) 10 MG tablet TAKE ONE & ONE-HALF TABLETS BY MOUTH ONCE DAILY AT BEDTIME  . amLODipine (NORVASC) 10 MG tablet Take 1 tablet (10 mg total) by mouth daily.  Marland Kitchen aspirin 325 MG tablet Take 325 mg by mouth 2 (two) times daily.   . Cholecalciferol (VITAMIN D-3 PO) Take by mouth daily.    . Cyanocobalamin (B-12 PO) Take 1 tablet by mouth daily.  . diclofenac (VOLTAREN) 75 MG EC tablet Take 75 mg by mouth 2 (two) times daily.  Marland Kitchen FLUoxetine (PROZAC) 40 MG capsule Take 80 mg by mouth daily.   Marland Kitchen glucosamine-chondroitin 500-400 MG tablet Take 1 tablet by mouth 2 (two) times daily.    . insulin detemir (LEVEMIR) 100 UNIT/ML injection Inject 0.3 mLs (30 Units total) into the skin 2 (two) times daily.  Marland Kitchen lisinopril (PRINIVIL,ZESTRIL) 20 MG tablet Take 1 tablet (20 mg total) by mouth daily.  . metFORMIN (GLUCOPHAGE) 1000 MG tablet Take 1,000 mg by mouth 2 (two) times daily with a meal.  . nitroGLYCERIN (NITROSTAT) 0.4 MG SL tablet Place 0.4 mg under the tongue every 5 (five) minutes as needed for chest pain.  . potassium chloride (KLOR-CON) 20 MEQ packet Take 20 mEq by mouth 2 (two) times daily.  . pravastatin (  PRAVACHOL) 40 MG tablet Take 1 tablet (40 mg total) by mouth daily.    FAMILY HISTORY:  Her indicated that her mother is deceased. She indicated that her father is deceased. She indicated that her brother is deceased.   SOCIAL HISTORY: She  reports that she quit smoking about 31 years ago. She has never used smokeless tobacco. She reports that she does not drink alcohol or use illicit drugs.  REVIEW OF SYSTEMS:   Unable to obtain d/t mechanical ventilation/intubation  SUBJECTIVE:   Intubated, sedated.   VITAL SIGNS: BP 131/82 mmHg  Pulse 117  Temp(Src) 102 F (38.9 C) (Core (Comment))  Resp 18  Ht 5\' 6"  (1.676 m)  Wt 165 lb 9.1 oz (75.1 kg)  BMI 26.74 kg/m2  SpO2 100%  HEMODYNAMICS:    VENTILATOR SETTINGS: Vent Mode:  [-] PRVC FiO2 (%):  [40 %-100 %] 40 % Set Rate:  [14 bmp] 14 bmp Vt Set:  [500 mL] 500 mL PEEP:  [5 cmH20] 5 cmH20 Plateau Pressure:  [13 cmH20] 13 cmH20  INTAKE / OUTPUT:    PHYSICAL EXAMINATION: General:  Intubated, sedated, on ventilator Neuro:  Sedated, RASS -1/0. Intermittently moving extremities. Pupils equal, reactive, no focal deficits noted.  HEENT:  PERRL, dry mucus membranes. Left orbital ecchymosis. Dried blood around mouth. ETT in place.  Cardiovascular:  Tachycardia, regular, mo murmurs, gallops, rubs.  Lungs:  Clear to auscultation bilaterally, no wheezes, crackles, or rales.  Abdomen:  Soft, non-tender, non-distended. BS+ Musculoskeletal:  No edema Skin:  Scattered ecchymosis. No rashes  LABS:  BMET  Recent Labs Lab 08/28/15 1026 08/28/15 1041  NA 137 137  K 4.2 4.1  CL 98* 101  CO2 16*  --   BUN 14 16  CREATININE 0.84 0.40*  GLUCOSE 488* 492*    Electrolytes  Recent Labs Lab 08/28/15 1026  CALCIUM 8.6*    CBC  Recent Labs Lab 08/28/15 1026 08/28/15 1041  WBC 18.5*  --   HGB 15.7* 17.0*  HCT 45.3 50.0*  PLT 320  --     Coag's  Recent Labs Lab 08/28/15 1026  APTT 24  INR 1.17    Sepsis Markers  Recent Labs Lab 08/28/15 1044  LATICACIDVEN 4.35*    ABG  Recent Labs Lab 08/28/15 1050  PHART 7.275*  PCO2ART 39.7  PO2ART 455.0*    Liver Enzymes  Recent Labs Lab 08/28/15 1026  AST 29  ALT 13*  ALKPHOS 117  BILITOT 1.5*  ALBUMIN 4.0    Glucose No results for input(s): GLUCAP in the last 168 hours.  Imaging Ct Head Wo Contrast  08/28/2015  CLINICAL DATA:  75 year old female with altered mental status and agitated EXAM: CT HEAD WITHOUT CONTRAST TECHNIQUE:  Contiguous axial images were obtained from the base of the skull through the vertex without intravenous contrast. COMPARISON:  Prior head CT 03/11/2014 FINDINGS: Acute crescentic extra-axial high attenuation fluid overlying the left cerebral convexity consistent with acute subdural hematoma. The maximal depth at the mid temporal lobe is approximately 13 mm. Additionally, there is likely some focal subdural blood overlying the right frontal lobe as well although this is in a region of combined streak and beam hardening artifact. No evidence of mass, mass effect, hydrocephalus or acute infarct. No intraventricular or subarachnoid hemorrhage visualized. Approximately 4 mm left-to-right midline shift. Motion artifact slightly limits evaluation for subtle calvarial fracture. No definite displaced calvarial fracture identified. Grossly normally aerated mastoid air cells and paranasal sinuses save for a small amount of  hemo sinus layering in the left maxillary sinus. Incompletely imaged soft tissue contusion overlying the left maxillary antrum. There is slight irregularity of the inferior wall of the left orbit. Fracture is not excluded. IMPRESSION: 1. Acute subdural hematoma overlying the left cerebral convex [place] measuring up to 13 mm in width. There is associated local mass effect with 4 mm left-to-right midline shift. 2. Additional small volume subdural hematoma versus artifact overlying the right frontal lobe. 3. Incompletely imaged soft tissue contusion overlying the left maxillary antrum with associated small volume left maxillary hemo sinus. 4. Question of possible left orbital floor fracture which is incompletely imaged. Recommend further evaluation with dedicated CT scan of the face when the patient is able to cooperate and there is less potential for severe motion artifact. Critical Value/emergent results were called by telephone at the time of interpretation on 08/28/2015 at 11:55 am to Dr. Pattricia Boss , who  verbally acknowledged these results. Electronically Signed   By: Jacqulynn Cadet M.D.   On: 08/28/2015 11:56   Dg Chest Portable 1 View  08/28/2015  CLINICAL DATA:  Patient status post intubation. Loss of consciousness. EXAM: PORTABLE CHEST 1 VIEW COMPARISON:  Chest radiograph 01/27/2015. FINDINGS: ET tube terminates in the mid trachea. Stable cardiomegaly. Minimal heterogeneous opacities left lung base. No pleural effusion or pneumothorax. Old left lateral rib fracture. IMPRESSION: ET tube terminates in mid trachea. Probable left basilar atelectasis. Electronically Signed   By: Lovey Newcomer M.D.   On: 08/28/2015 10:39     STUDIES:  CT head 1/8 >> Acute subdural hematoma overlying the left cerebral convexity measuring up to 13 mm in width. There is associated local mass effect with 4 mm left-to-right midline shift. Additional small volume subdural hematoma versus artifact overlying the right frontal lobe. Incompletely imaged soft tissue contusion overlying the left maxillary antrum with associated small volume left maxillary hemo sinus. Question of possible left orbital floor fracture which is incompletely imaged.  CT head 1/8 PM >>  CULTURES: Blood 1/8 >>  ANTIBIOTICS: Vanc 1/8 >> Zosyn 1/8 >>  SIGNIFICANT EVENTS: 1/8 Admit 1/8 Intubated in the ED  LINES/TUBES: ETT 1/8 >> PIV   DISCUSSION: 75 y.o. female w/ PMHx of HTN, HLD, DM type II, Migraines, depression, and anxiety, presents to the ED by EMS w/ complaints of confusion and multiple falls at home, presented to the ED with acute encephalopathy, severe hyperglycemia, lactic acidosis, and left subdural hematoma.  ASSESSMENT / PLAN:  PULMONARY A: Ventilator Dependent Respiratory Failure P:   Full vent support for now No evidence of pneumonia Wean as tolerated in AM   CARDIOVASCULAR A:  Sepsis Tachycardia P:  Telemetry monitoring IVF; complete bolus, then NS @ 125 cc/hr Goal MAP > 65  RENAL A:   Anion-Gap Metabolic  Acidosis, 2/2 lactic acidosis/ketoacidosis P:   IVF as above Insulin gtt Trend lactate Repeat BMP in PM  GASTROINTESTINAL A:   SUP No acute issues P:   NPO for now Protonix  HEMATOLOGIC A:   Hemoconcentration Leukocytosis 2/2 sepsis P:  CBC in AM IVF  INFECTIOUS A:   Sepsis with unclear source; possible UTI vs C. Diff P:   Continue Vanc + Zosyn; de-escalate when appropriate C. Diff PCR, cultures pending  ENDOCRINE A:   DM type II with severe hyperglycemia   P:   Insulin gtt for now  NEUROLOGIC A:   Acute encephalopathy; prior to fall Left subdural hematoma ?Left Orbital Fracture P:   RASS goal: -2 Neurosurgery consulted Repeat  CT head in PM Continue Propofol for now Fentanyl, versed prn   FAMILY  - Updates: Son updated at bedside 08/28/15    Natasha Bence, MD PGY-3, Internal Medicine Pager: [phone]   08/28/2015, 12:07 PM

## 2015-08-28 NOTE — Progress Notes (Signed)
ANTIBIOTIC CONSULT NOTE - INITIAL  Pharmacy Consult for vancomycin and Zosyn Indication: rule out sepsis  Allergies  Allergen Reactions  . Epinephrine     Tachycardia when used as a local anesthetic  . Hydromorphone     UNKNOWN  . Morphine And Related     UNKNOWN  . Phenergan [Promethazine Hcl]     " GOES WACK YOU WITH THIS MEDICATION" STATEMENT MADE BY PATIENT  . Succinylcholine Other (See Comments)    unknown  . Latex Rash    Patient Measurements: Height: 5\' 6"  (167.6 cm) Weight: 165 lb 9.1 oz (75.1 kg) (weighed on trauma stretcher) IBW/kg (Calculated) : 59.3   Vital Signs: Temp: 101.8 F (38.8 C) (01/08 1045) Temp Source: Core (Comment) (01/08 1030) BP: 154/75 mmHg (01/08 1045) Pulse Rate: 121 (01/08 1045) Intake/Output from previous day:   Intake/Output from this shift:    Labs:  Recent Labs  08/28/15 1026 08/28/15 1041  WBC 18.5*  --   HGB 15.7* 17.0*  PLT 320  --   CREATININE  --  0.40*   Estimated Creatinine Clearance: 64.9 mL/min (by C-G formula based on Cr of 0.4). No results for input(s): VANCOTROUGH, VANCOPEAK, VANCORANDOM, GENTTROUGH, GENTPEAK, GENTRANDOM, TOBRATROUGH, TOBRAPEAK, TOBRARND, AMIKACINPEAK, AMIKACINTROU, AMIKACIN in the last 72 hours.   Microbiology: No results found for this or any previous visit (from the past 720 hour(s)).  Assessment: 39 YOF brought in by family with generalized complaint of not feeling well. Woke up today combative and confused and urinating and defecating on herself. She required intubation in the ED. Head CT currently pending.  Temp up to 101.2 in the ED, lactic acid 4.35. WBC elevated at 18.5. SCr 0.4, est CrCl ~60-60mL/min- appears to be at baseline.  Vancomycin 1g IV x1 and Zosyn 3.375g IV x1 given at 1100.  Blood cultures have been collected.  Goal of Therapy:  Vancomycin trough level 15-20 mcg/ml  Plan:  -vancomycin 1g IV q12h -Zosyn 3.375g IV q8h EI -follow c/s, clinical progression, renal  function, trough at [place] D. Kymani Shimabukuro, PharmD, BCPS Clinical Pharmacist Pager: [phone] 08/28/2015 11:10 AM

## 2015-08-29 ENCOUNTER — Encounter (HOSPITAL_COMMUNITY): Payer: Self-pay | Admitting: Radiology

## 2015-08-29 ENCOUNTER — Inpatient Hospital Stay (HOSPITAL_COMMUNITY): Payer: Medicare Other

## 2015-08-29 DIAGNOSIS — A419 Sepsis, unspecified organism: Secondary | ICD-10-CM

## 2015-08-29 DIAGNOSIS — N390 Urinary tract infection, site not specified: Secondary | ICD-10-CM

## 2015-08-29 LAB — GLUCOSE, CAPILLARY
GLUCOSE-CAPILLARY: 154 mg/dL — AB (ref 65–99)
GLUCOSE-CAPILLARY: 121 mg/dL — AB (ref 65–99)
GLUCOSE-CAPILLARY: 110 mg/dL — AB (ref 65–99)
GLUCOSE-CAPILLARY: 131 mg/dL — AB (ref 65–99)
GLUCOSE-CAPILLARY: 165 mg/dL — AB (ref 65–99)
GLUCOSE-CAPILLARY: 166 mg/dL — AB (ref 65–99)
GLUCOSE-CAPILLARY: 218 mg/dL — AB (ref 65–99)
GLUCOSE-CAPILLARY: 183 mg/dL — AB (ref 65–99)
GLUCOSE-CAPILLARY: 117 mg/dL — AB (ref 65–99)
GLUCOSE-CAPILLARY: 135 mg/dL — AB (ref 65–99)
Glucose-Capillary: 127 mg/dL — ABNORMAL HIGH (ref 65–99)
Glucose-Capillary: 131 mg/dL — ABNORMAL HIGH (ref 65–99)
Glucose-Capillary: 153 mg/dL — ABNORMAL HIGH (ref 65–99)
Glucose-Capillary: 188 mg/dL — ABNORMAL HIGH (ref 65–99)
Glucose-Capillary: 216 mg/dL — ABNORMAL HIGH (ref 65–99)

## 2015-08-29 LAB — BLOOD GAS, ARTERIAL
ACID-BASE DEFICIT: 5.5 mmol/L — AB (ref 0.0–2.0)
BICARBONATE: 19.6 meq/L — AB (ref 20.0–24.0)
Drawn by: 252031
FIO2: 0.4
LHR: 14 {breaths}/min
O2 SAT: 98.9 %
PATIENT TEMPERATURE: 98.6
PEEP/CPAP: 5 cmH2O
PH ART: 7.316 — AB (ref 7.350–7.450)
TCO2: 20.8 mmol/L (ref 0–100)
VT: 500 mL
pCO2 arterial: 39.5 mmHg (ref 35.0–45.0)
pO2, Arterial: 146 mmHg — ABNORMAL HIGH (ref 80.0–100.0)

## 2015-08-29 LAB — CBC
HCT: 33 % — ABNORMAL LOW (ref 36.0–46.0)
Hemoglobin: 11.2 g/dL — ABNORMAL LOW (ref 12.0–15.0)
MCH: 32.4 pg (ref 26.0–34.0)
MCHC: 33.9 g/dL (ref 30.0–36.0)
MCV: 95.4 fL (ref 78.0–100.0)
PLATELETS: 190 10*3/uL (ref 150–400)
RBC: 3.46 MIL/uL — AB (ref 3.87–5.11)
RDW: 12.7 % (ref 11.5–15.5)
WBC: 11.5 10*3/uL — ABNORMAL HIGH (ref 4.0–10.5)

## 2015-08-29 LAB — BASIC METABOLIC PANEL
Anion gap: 8 (ref 5–15)
BUN: 16 mg/dL (ref 6–20)
CHLORIDE: 114 mmol/L — AB (ref 101–111)
CO2: 18 mmol/L — AB (ref 22–32)
Calcium: 7.4 mg/dL — ABNORMAL LOW (ref 8.9–10.3)
Creatinine, Ser: 0.53 mg/dL (ref 0.44–1.00)
GFR calc non Af Amer: >60 mL/min (ref >60)
Glucose, Bld: 133 mg/dL — ABNORMAL HIGH (ref 65–99)
POTASSIUM: 3.8 mmol/L (ref 3.5–5.1)
SODIUM: 140 mmol/L (ref 135–145)

## 2015-08-29 LAB — LACTIC ACID, PLASMA: Lactic Acid, Venous: 2.5 mmol/L (ref 0.5–2.0)

## 2015-08-29 LAB — PROCALCITONIN: Procalcitonin: 1.11 ng/mL

## 2015-08-29 MED ORDER — PIVOT 1.5 CAL PO LIQD
1000.0000 mL | ORAL | Status: DC
  Administered 2015-08-29 (×2): 1000 mL

## 2015-08-29 MED ORDER — MIDAZOLAM HCL 2 MG/2ML IJ SOLN
1.0000 mg | INTRAMUSCULAR | Status: DC | PRN
  Administered 2015-08-29 – 2015-08-31 (×13): 1 mg via INTRAVENOUS
  Filled 2015-08-29 (×12): qty 2

## 2015-08-29 MED ORDER — INSULIN ASPART 100 UNIT/ML ~~LOC~~ SOLN
2.0000 [IU] | SUBCUTANEOUS | Status: DC
  Administered 2015-08-29 (×2): 4 [IU] via SUBCUTANEOUS

## 2015-08-29 MED ORDER — VITAL HIGH PROTEIN PO LIQD
1000.0000 mL | ORAL | Status: DC
Start: 2015-08-29 — End: 2015-08-29

## 2015-08-29 MED ORDER — INSULIN ASPART 100 UNIT/ML ~~LOC~~ SOLN
0.0000 [IU] | SUBCUTANEOUS | Status: DC
  Administered 2015-08-29: 2 [IU] via SUBCUTANEOUS
  Administered 2015-08-29: 3 [IU] via SUBCUTANEOUS
  Administered 2015-08-29: 5 [IU] via SUBCUTANEOUS
  Administered 2015-08-30 (×5): 3 [IU] via SUBCUTANEOUS
  Administered 2015-08-31 (×2): 2 [IU] via SUBCUTANEOUS
  Administered 2015-08-31: 3 [IU] via SUBCUTANEOUS
  Administered 2015-08-31 – 2015-09-01 (×3): 2 [IU] via SUBCUTANEOUS
  Administered 2015-09-01: 3 [IU] via SUBCUTANEOUS

## 2015-08-29 MED ORDER — PANTOPRAZOLE SODIUM 40 MG PO PACK
40.0000 mg | PACK | ORAL | Status: DC
  Administered 2015-08-29 – 2015-08-31 (×3): 40 mg
  Filled 2015-08-29 (×3): qty 20

## 2015-08-29 MED ORDER — LACTATED RINGERS IV SOLN
INTRAVENOUS | Status: DC
  Administered 2015-08-29: 12:00:00 via INTRAVENOUS

## 2015-08-29 MED ORDER — INSULIN GLARGINE 100 UNIT/ML ~~LOC~~ SOLN
15.0000 [IU] | Freq: Every day | SUBCUTANEOUS | Status: DC
  Administered 2015-08-29: 15 [IU] via SUBCUTANEOUS
  Filled 2015-08-29: qty 0.15

## 2015-08-29 MED ORDER — ANTISEPTIC ORAL RINSE SOLUTION (CORINZ)
7.0000 mL | OROMUCOSAL | Status: DC
  Administered 2015-08-29 – 2015-08-31 (×28): 7 mL via OROMUCOSAL

## 2015-08-29 MED ORDER — PRO-STAT SUGAR FREE PO LIQD
30.0000 mL | Freq: Every day | ORAL | Status: DC
Start: 2015-08-29 — End: 2015-08-31
  Administered 2015-08-29 – 2015-08-31 (×3): 30 mL
  Filled 2015-08-29 (×3): qty 30

## 2015-08-29 MED ORDER — SODIUM CHLORIDE 0.9 % IV BOLUS (SEPSIS)
500.0000 mL | Freq: Once | INTRAVENOUS | Status: AC
  Administered 2015-08-29: 500 mL via INTRAVENOUS

## 2015-08-29 MED ORDER — MIDAZOLAM HCL 2 MG/2ML IJ SOLN
INTRAMUSCULAR | Status: AC
  Filled 2015-08-29: qty 2

## 2015-08-29 MED ORDER — INSULIN GLARGINE 100 UNIT/ML ~~LOC~~ SOLN
15.0000 [IU] | Freq: Every day | SUBCUTANEOUS | Status: DC
  Administered 2015-08-29 – 2015-09-02 (×5): 15 [IU] via SUBCUTANEOUS
  Filled 2015-08-29 (×9): qty 0.15

## 2015-08-29 NOTE — Progress Notes (Addendum)
PULMONARY / CRITICAL CARE MEDICINE   Name: Margaret Fischer MRN: [id number] DOB: 11-16-1940    ADMISSION DATE:  08/28/2015  REFERRING MD:  Altered mental status  CHIEF COMPLAINT:  ER  SUBJECTIVE:  Agitated with WUA.  VITAL SIGNS: BP 100/43 mmHg  Pulse 60  Temp(Src) 98.8 F (37.1 C) (Core (Comment))  Resp 14  Ht 5\' 6"  (1.676 m)  Wt 167 lb 5.3 oz (75.9 kg)  BMI 27.02 kg/m2  SpO2 100%  VENTILATOR SETTINGS: Vent Mode:  [-] PRVC FiO2 (%):  [40 %-100 %] 40 % Set Rate:  [14 bmp] 14 bmp Vt Set:  [500 mL] 500 mL PEEP:  [5 cmH20] 5 cmH20 Plateau Pressure:  [11 cmH20-16 cmH20] 14 cmH20  INTAKE / OUTPUT: I/O last 3 completed shifts: In: 4432.4 [I.V.:3577.4; Other:40; IV Piggyback:815] Out: 1255 [Urine:1255]  PHYSICAL EXAMINATION: General: sedated Neuro:  RASS -2 HEENT:  Pupils pinpoint and reactive, ecchymosis around Lt eye Cardiovascular:  Regular, no murmur Lungs:  Scattered crackles Abdomen:  Soft, non tender Musculoskeletal:  No edema Skin:  Multiple areas of ecchymosis  LABS:  BMET  Recent Labs Lab 08/28/15 1722 08/28/15 2038 08/29/15 0254  NA 143 143 140  K 3.7 3.6 3.8  CL 112* 110 114*  CO2 22 23 18*  BUN 12 14 16   CREATININE 0.72 0.72 0.53  GLUCOSE 273* 189* 133*    Electrolytes  Recent Labs Lab 08/28/15 1722 08/28/15 2038 08/29/15 0254  CALCIUM 8.0* 8.0* 7.4*    CBC  Recent Labs Lab 08/28/15 1026 08/28/15 1041 08/29/15 0254  WBC 18.5*  --  11.5*  HGB 15.7* 17.0* 11.2*  HCT 45.3 50.0* 33.0*  PLT 320  --  190    Coag's  Recent Labs Lab 08/28/15 1026  APTT 24  INR 1.17    Sepsis Markers  Recent Labs Lab 08/28/15 1256 08/28/15 1722 08/28/15 2038  LATICACIDVEN 2.08* 2.5* 2.5*    ABG  Recent Labs Lab 08/28/15 1050 08/29/15 0349  PHART 7.275* 7.316*  PCO2ART 39.7 39.5  PO2ART 455.0* 146*    Liver Enzymes  Recent Labs Lab 08/28/15 1026  AST 29  ALT 13*  ALKPHOS 117  BILITOT 1.5*  ALBUMIN 4.0    Cardiac  Enzymes No results for input(s): TROPONINI, PROBNP in the last 168 hours.  Glucose  Recent Labs Lab 08/29/15 0007 08/29/15 0108 08/29/15 0208 08/29/15 0259 08/29/15 0415 08/29/15 0849  GLUCAP 131* 121* 110* 131* 165* 166*    Imaging Ct Head Wo Contrast  08/29/2015  CLINICAL DATA:  Follow-up LEFT subdural hematoma, altered mental status. Struck head yesterday. EXAM: CT HEAD WITHOUT CONTRAST TECHNIQUE: Contiguous axial images were obtained from the base of the skull through the vertex without intravenous contrast. COMPARISON:  CT head August 28, 2015 FINDINGS: LEFT holo hemispheric dense acute subdural hematoma measuring up to 12 mm with resultant 2 mm LEFT-to-RIGHT midline shift. 2 mm LEFT parafalcine subdural hematoma with trace subarachnoid hemorrhage along the interhemispheric fissure, RIGHT frontal sulci. No ventricular entrapment or hydrocephalus. No intraparenchymal hemorrhage. No acute large vascular territory infarct. Basal cisterns are patent. Moderate calcific atherosclerosis of the carotid siphons. Small LEFT maxillary sinus air-fluid level. Suspected LEFT orbital floor fracture may be acute. Mastoid air cells are well aerated. No skull fracture. IMPRESSION: Similar appearance of acute 12 mm holo hemispheric subdural hematoma resulting in 2 mm LEFT-to-RIGHT midline shift. 2 mm parafalcine subdural hematoma with small amount of subarachnoid hemorrhage. Suspected LEFT orbital floor fracture may be acute. Recommend dedicated maxillofacial  CT. Electronically Signed   By: Elon Alas M.D.   On: 08/29/2015 05:03   Ct Head Wo Contrast  08/28/2015  CLINICAL DATA:  Followup subdural hematoma. EXAM: CT HEAD WITHOUT CONTRAST TECHNIQUE: Contiguous axial images were obtained from the base of the skull through the vertex without intravenous contrast. COMPARISON:  03/11/2014 FINDINGS: There is a large left subdural hematoma measuring 13 mm in maximum thickness, image 17 of series 201. This  extends anteriorly over the left frontal lobe with a parafalcine component measuring 3 mm in thickness. The ventricular volumes appear normal. No hydrocephalus or intracranial hemorrhage. There is mild left-to-right midline shift of 2 mm. Prominence of the sulci identified compatible with brain atrophy. There is mild diffuse low attenuation within the subcortical and periventricular white matter compatible with chronic microvascular disease.Fluid level is noted within the left maxillary sinus. The mastoid air cells are clear. The calvarium is intact. IMPRESSION: 1. Acute left subdural hematoma measuring 13 mm and causing approximately 2 mm of left-to-right midline shift. 2. Small vessel ischemic change and brain atrophy. Electronically Signed   By: Kerby Moors M.D.   On: 08/28/2015 15:11   Ct Head Wo Contrast  08/28/2015  CLINICAL DATA:  75 year old female with altered mental status and agitated EXAM: CT HEAD WITHOUT CONTRAST TECHNIQUE: Contiguous axial images were obtained from the base of the skull through the vertex without intravenous contrast. COMPARISON:  Prior head CT 03/11/2014 FINDINGS: Acute crescentic extra-axial high attenuation fluid overlying the left cerebral convexity consistent with acute subdural hematoma. The maximal depth at the mid temporal lobe is approximately 13 mm. Additionally, there is likely some focal subdural blood overlying the right frontal lobe as well although this is in a region of combined streak and beam hardening artifact. No evidence of mass, mass effect, hydrocephalus or acute infarct. No intraventricular or subarachnoid hemorrhage visualized. Approximately 4 mm left-to-right midline shift. Motion artifact slightly limits evaluation for subtle calvarial fracture. No definite displaced calvarial fracture identified. Grossly normally aerated mastoid air cells and paranasal sinuses save for a small amount of hemo sinus layering in the left maxillary sinus. Incompletely  imaged soft tissue contusion overlying the left maxillary antrum. There is slight irregularity of the inferior wall of the left orbit. Fracture is not excluded. IMPRESSION: 1. Acute subdural hematoma overlying the left cerebral convex [place] measuring up to 13 mm in width. There is associated local mass effect with 4 mm left-to-right midline shift. 2. Additional small volume subdural hematoma versus artifact overlying the right frontal lobe. 3. Incompletely imaged soft tissue contusion overlying the left maxillary antrum with associated small volume left maxillary hemo sinus. 4. Question of possible left orbital floor fracture which is incompletely imaged. Recommend further evaluation with dedicated CT scan of the face when the patient is able to cooperate and there is less potential for severe motion artifact. Critical Value/emergent results were called by telephone at the time of interpretation on 08/28/2015 at 11:55 am to Dr. Pattricia Boss , who verbally acknowledged these results. Electronically Signed   By: Jacqulynn Cadet M.D.   On: 08/28/2015 11:56   Dg Chest Port 1 View  08/29/2015  CLINICAL DATA:  75 year old female with respiratory failure. Subsequent encounter. EXAM: PORTABLE CHEST 1 VIEW COMPARISON:  08/28/2015. FINDINGS: Endotracheal tube tip 4 cm above the carina. Nasogastric tube courses below the diaphragm. Tip is not included on the present exam. Cardiomegaly. Pulmonary vascular prominence most notable centrally. Basilar subsegmental atelectasis with poor inspiration. Basilar infiltrate secondary less likely  consideration. No gross pneumothorax. Calcified aorta is slightly tortuous. IMPRESSION: Poor inspiratory portable exam with pulmonary vascular prominence most notable centrally. Basilar subsegmental atelectasis. Nasogastric tube courses below the diaphragm. Tip is not included on the present exam. Electronically Signed   By: Genia Del M.D.   On: 08/29/2015 08:05   Dg Chest Portable 1  View  08/28/2015  CLINICAL DATA:  Patient status post intubation. Loss of consciousness. EXAM: PORTABLE CHEST 1 VIEW COMPARISON:  Chest radiograph 01/27/2015. FINDINGS: ET tube terminates in the mid trachea. Stable cardiomegaly. Minimal heterogeneous opacities left lung base. No pleural effusion or pneumothorax. Old left lateral rib fracture. IMPRESSION: ET tube terminates in mid trachea. Probable left basilar atelectasis. Electronically Signed   By: Lovey Newcomer M.D.   On: 08/28/2015 10:39   Dg Abd Portable 1v  08/28/2015  CLINICAL DATA:  Encounter for orogastric (OG) tube placement EXAM: PORTABLE ABDOMEN - 1 VIEW COMPARISON:  None. FINDINGS: OG tube in the left upper quadrant. Its position suggests it lies along the greater curvature with tip in the distal body. IMPRESSION: OG tube as described. Electronically Signed   By: Skipper Cliche M.D.   On: 08/28/2015 19:23    STUDIES:  1/08 CT head >> Lt SDH  CULTURES: 1/08 Blood >> 1/08 Urine >> 1/08 Sputum >> 1/08 Influenza PCR >> negative  ANTIBIOTICS: 1/08 Vancomycin >> 1/08  1/08 Zosyn >>  SIGNIFICANT EVENTS: 1/08 Admit, neurosurgery consulted  LINES/TUBES: 1/08 ETT >>  DISCUSSION: 75 yo female former smoker presented with altered mental status and fall at home.  Found to have UTI, lactic acidosis, fever and hyperglycemia.  CT head showed Lt SDH.  She has PMhx of HTN, HLD, DM, Migraines, depression, anxiety.  ASSESSMENT / PLAN:  PULMONARY A: Acute respiratory failure 2nd to altered mental status. P:   Pressure support wean as tolerated F/u CXR  CARDIOVASCULAR A:  Sepsis 2nd to UTI. Hx of HTN, HLD. P:  Continue IV fluids Hold outpt norvasc, ASA, lisinopril, pravachol  RENAL A:   Anion gap metabolic acidosis >> resolved. Lactic acidosis >> resolved. P:   F/u BMET Monitor renal fx, urine outpt  GASTROINTESTINAL A:   Nutrition. P:   Tube feeds while on vent Protonix for SUP  HEMATOLOGIC A:   Leukocytosis. P:   F/u CBC SCDs for DVT prevention  INFECTIOUS A:   Sepsis from UTI. P:   Day 2 zosyn  ENDOCRINE A:   DM type II with hyperglycemia. P:   SSI with lantus Hold outpt levemir, glucophage  NEUROLOGIC A:  Acute encephalopathy 2nd to sepsis, Lt SDH >> UDS positive for THC.  Lt SDH. Hx of depression/anxiety. P:   RASS goal: -1 AEDs per neurology Hold outpt klonopin, neurontin, elavil, prozac  Trauma A: ?Lt orbital fx. P: Check maxillofacial CT   CC time 33 minutes.  Chesley Mires, MD [facility] Pulmonary/Critical Care 08/29/2015, 9:43 AM Pager:  [phone] After 3pm call: [phone]

## 2015-08-29 NOTE — Care Management Note (Signed)
[facility] Management Note  Patient Details  Name: Margaret Fischer MRN: [id number] Date of Birth: [birth year]  Subjective/Objective:   Pt admitted on 08/28/15 with acute encephalopathy, SDH.  PTA, pt independent, but family reports possible early dementia.                   Action/Plan: Pt remains currently sedated and on ventilator.  Will follow for discharge planning as pt progresses.    Expected Discharge Date:                  Expected Discharge Plan:  Skilled Nursing Facility  In-House Referral:  Clinical Social Work  Discharge planning Services  CM Consult  Post Acute Care Choice:    Choice offered to:     DME Arranged:    DME Agency:     HH Arranged:    [place] Agency:     Status of Service:  In process, will continue to follow  Medicare Important Message Given:    Date Medicare IM Given:    Medicare IM give by:    Date Additional Medicare IM Given:    Additional Medicare Important Message give by:     If discussed at [place] of Stay Meetings, dates discussed:    Additional Comments:  Reinaldo Raddle, RN, BSN  Trauma/Neuro ICU [place] Manager [phone]

## 2015-08-29 NOTE — Progress Notes (Addendum)
Initial Nutrition Assessment  INTERVENTION:   Initiate Pivot 1.5 @ 25 ml/hr via OG tube and increase by 10 ml every 4 hours to goal rate of 45 ml/hr.   30 ml Prostat daily.    Tube feeding regimen provides 1720 kcal (99% of needs), 116 grams of protein, and 819 ml of H2O.   NUTRITION DIAGNOSIS:   Inadequate oral intake related to inability to eat as evidenced by NPO status.  GOAL:   Patient will meet greater than or equal to 90% of their needs  MONITOR:   TF tolerance, Vent status, Labs, I & O's  REASON FOR ASSESSMENT:   Consult Enteral/tube feeding initiation and management  ASSESSMENT:   Pt with hx of HTN, HLD, DM admitted from home with altered mental status and fall. Pt found to have UTI, lactic acidosis, fever, hyperglycemia and per CT left SDH.  Patient is currently intubated on ventilator support MV: 7.1 L/min Temp (24hrs), Avg:99.7 F (37.6 C), Min:98.2 F (36.8 C), Max:101.7 F (38.7 C)  Labs reviewed: CBG"s: 165-166 Nutrition-Focused physical exam completed. Findings are no fat depletion, mild muscle depletion in quads, and no edema.    Diet Order:  Diet NPO time specified  Skin:  Reviewed, no issues  Last BM:  1/8  Height:   Ht Readings from Last 1 Encounters:  08/28/15 5\' 6"  (1.676 m)   Weight:   Wt Readings from Last 1 Encounters:  08/29/15 167 lb 5.3 oz (75.9 kg)   Ideal Body Weight:  59 kg  BMI:  Body mass index is 27.02 kg/(m^2).  Estimated Nutritional Needs:   Kcal:  1738  Protein:  100-115 grams  Fluid:  > 1.7 L/day  EDUCATION NEEDS:   No education needs identified at this time  [place], [place], [place] Pager [phone] After Hours Pager

## 2015-08-29 NOTE — Progress Notes (Signed)
Transported patient to C.T while on the ventilator. Patient remained stable during transport  

## 2015-08-29 NOTE — Progress Notes (Signed)
Pt responded to previous 500 cc bolus but hypotensive again. BP 93/44 (MAP 58), oliguric. Fentanyl at 66mcg/hr. MD notified. Orders given for 500cc bolus over 30 minutes. Will administer bolus and continue to monitor.

## 2015-08-29 NOTE — Progress Notes (Addendum)
CRITICAL VALUE ALERT  Critical value received:  Lactic Acid 2.5  Date of notification:  08/28/2015  Time of notification:  2130  Critical value read back:Yes.    Nurse who received alert:  Therese Sarah   MD notified (1st page):  Dr Elsworth Soho  Time of first page:  2200  MD notified (2nd page):  Time of second page:  Responding MD:  Dr Elsworth Soho.  Time MD responded:  2200

## 2015-08-29 NOTE — Progress Notes (Signed)
Pt on propofol gtt, hypotensive and low urine output. E-link MD Elsworth Soho notified. Orders given for 500cc NS bolus, d/c prop gtt, and start fent gtt instead. Will carry out orders and continue to monitor.

## 2015-08-29 NOTE — Progress Notes (Signed)
[place] Progress Note Patient Name: Margaret Fischer DOB: 11-11-1940 MRN: [id number]   Date of Service  08/29/2015  HPI/Events of Note  Multiple issues: 1. Hypotension - BP = 93/44 with MAP = 58 and 2. Oliguria.   eICU Interventions  Will order 0.9 NaCl 500 mL IV over 30 minutes now.      Intervention Category Major Interventions: Hypotension - evaluation and management Intermediate Interventions: Oliguria - evaluation and management  Sommer,Steven Eugene 08/29/2015, 12:18 AM

## 2015-08-29 NOTE — Progress Notes (Signed)
Pt. Was transported to CT & back to room 3M13 without any complications.

## 2015-08-29 NOTE — Progress Notes (Signed)
[place] Progress Note Patient Name: Margaret Fischer DOB: 09/08/1940 MRN: [id number]   Date of Service  08/29/2015  HPI/Events of Note  Agitation - intermittantly flailing arms. Currently on a Fentanyl IV infusion at 75 mcg/hour. BP has been "soft" prohibiting titration of Fentanyl IV infusion to ceiling dose.   eICU Interventions  Will order: 1. Versed 1 mg IV Q 1 hour PRN.      Intervention Category Minor Interventions: Agitation / anxiety - evaluation and management  Abhiraj Dozal Eugene 08/29/2015, 1:25 AM

## 2015-08-29 NOTE — Progress Notes (Signed)
PT on 78mcg/hr fent gtt, flailing around in bed, agitated, BP still soft despite 500cc NS bolus. Unable to titrate IV fent up due to hypotension. Elink MD notified. Orders given for prn versed. Will administer and continue to monitor.

## 2015-08-29 NOTE — Progress Notes (Signed)
No acute events Intubated and sedated Arouses, opens eyes PERRL Moves purposefully bilaterally CT Head: Stable from yesterday afternoon Doing well from SDH standpoint Continue Keppra No role for surgery at this time Will continue to follow

## 2015-08-30 ENCOUNTER — Inpatient Hospital Stay (HOSPITAL_COMMUNITY): Payer: Medicare Other

## 2015-08-30 LAB — BASIC METABOLIC PANEL
ANION GAP: 7 (ref 5–15)
BUN: 15 mg/dL (ref 6–20)
CALCIUM: 7.8 mg/dL — AB (ref 8.9–10.3)
CO2: 24 mmol/L (ref 22–32)
CREATININE: 0.48 mg/dL (ref 0.44–1.00)
Chloride: 110 mmol/L (ref 101–111)
GLUCOSE: 171 mg/dL — AB (ref 65–99)
Potassium: 3.6 mmol/L (ref 3.5–5.1)
SODIUM: 141 mmol/L (ref 135–145)

## 2015-08-30 LAB — CBC
HEMATOCRIT: 33 % — AB (ref 36.0–46.0)
HEMOGLOBIN: 10.6 g/dL — AB (ref 12.0–15.0)
MCH: 31.6 pg (ref 26.0–34.0)
MCHC: 32.1 g/dL (ref 30.0–36.0)
MCV: 98.5 fL (ref 78.0–100.0)
Platelets: 206 10*3/uL (ref 150–400)
RBC: 3.35 MIL/uL — ABNORMAL LOW (ref 3.87–5.11)
RDW: 12.8 % (ref 11.5–15.5)
WBC: 8.3 10*3/uL (ref 4.0–10.5)

## 2015-08-30 LAB — CULTURE, RESPIRATORY W GRAM STAIN

## 2015-08-30 LAB — URINE CULTURE: CULTURE: NO GROWTH

## 2015-08-30 LAB — GLUCOSE, CAPILLARY
GLUCOSE-CAPILLARY: 172 mg/dL — AB (ref 65–99)
GLUCOSE-CAPILLARY: 156 mg/dL — AB (ref 65–99)
Glucose-Capillary: 163 mg/dL — ABNORMAL HIGH (ref 65–99)
Glucose-Capillary: 191 mg/dL — ABNORMAL HIGH (ref 65–99)
Glucose-Capillary: 164 mg/dL — ABNORMAL HIGH (ref 65–99)

## 2015-08-30 LAB — CULTURE, RESPIRATORY

## 2015-08-30 LAB — PROCALCITONIN: Procalcitonin: 0.49 ng/mL

## 2015-08-30 MED ORDER — DEXTROSE 5 % IV SOLN
INTRAVENOUS | Status: DC
  Administered 2015-08-30 – 2015-09-04 (×2): via INTRAVENOUS

## 2015-08-30 MED ORDER — PROMETHAZINE HCL 25 MG/ML IJ SOLN: 12.5000 mg | Freq: Four times a day (QID) | INTRAMUSCULAR | Status: DC | PRN

## 2015-08-30 MED ORDER — ONDANSETRON HCL 4 MG/2ML IJ SOLN
INTRAMUSCULAR | Status: AC
  Administered 2015-08-30: 4 mg
  Filled 2015-08-30: qty 2

## 2015-08-30 MED ORDER — ONDANSETRON HCL 4 MG/2ML IJ SOLN
4.0000 mg | Freq: Four times a day (QID) | INTRAMUSCULAR | Status: DC | PRN
  Administered 2015-08-30 – 2015-08-31 (×3): 4 mg via INTRAVENOUS
  Filled 2015-08-30 (×3): qty 2

## 2015-08-30 MED ORDER — ATROPINE SULFATE 0.1 MG/ML IJ SOLN
INTRAMUSCULAR | Status: AC
  Filled 2015-08-30: qty 10

## 2015-08-30 NOTE — Progress Notes (Signed)
[place] Progress Note Patient Name: Margaret Fischer Georg DOB: [birth year] MRN: [id number]   Date of Service  08/30/2015  HPI/Events of Note  Vomiting. Tube feeds held. Patient on Lantus.   eICU Interventions  Will order: 1. Zofran 4 mg IV Q 6 hours PRN N/V. 2. D5W to run at 40 mL/hour. 3. Decrease LR IV rate from 75 mL/hour to 50 mL/hour.      Intervention Category Intermediate Interventions: Other:  Amour Cutrone Cornelia Copa 08/30/2015, 6:01 AM

## 2015-08-30 NOTE — Progress Notes (Signed)
No acute events AVSS Arouses easily Moves both sides purposefully and with good strength Stable Repeat CT scan Thursday night, sooner if neurological decline Continue AED

## 2015-08-30 NOTE — Progress Notes (Addendum)
PULMONARY / CRITICAL CARE MEDICINE   Name: Margaret Fischer MRN: [id number] DOB: Nov 16, 1940    ADMISSION DATE:  08/28/2015  REFERRING MD:  Altered mental status  CHIEF COMPLAINT:  ER  SUBJECTIVE:  Follows commands on WUA.  VITAL SIGNS: BP 120/46 mmHg  Pulse 53  Temp(Src) 99 F (37.2 C) (Core (Comment))  Resp 14  Ht 5\' 6"  (1.676 m)  Wt 176 lb 5.9 oz (80 kg)  BMI 28.48 kg/m2  SpO2 100%  VENTILATOR SETTINGS: Vent Mode:  [-] PRVC FiO2 (%):  [40 %] 40 % Set Rate:  [14 bmp] 14 bmp Vt Set:  [500 mL] 500 mL PEEP:  [5 cmH20] 5 cmH20 Plateau Pressure:  [14 cmH20-16 cmH20] 14 cmH20  INTAKE / OUTPUT: I/O last 3 completed shifts: In: 4996.9 [I.V.:3281.5; Other:40; NG/GT:555.4; IV Piggyback:1120] Out: 956 [Urine:955; Emesis/NG output:1]  PHYSICAL EXAMINATION: General: sedated Neuro:  RASS -2 HEENT:  ecchymosis around Lt eye Cardiovascular:  Regular, no murmur Lungs:  Scattered crackles Abdomen:  Soft, non tender Musculoskeletal:  No edema Skin:  Multiple areas of ecchymosis  LABS:  BMET  Recent Labs Lab 08/28/15 2038 08/29/15 0254 08/30/15 0336  NA 143 140 141  K 3.6 3.8 3.6  CL 110 114* 110  CO2 23 18* 24  BUN 14 16 15   CREATININE 0.72 0.53 0.48  GLUCOSE 189* 133* 171*    Electrolytes  Recent Labs Lab 08/28/15 2038 08/29/15 0254 08/30/15 0336  CALCIUM 8.0* 7.4* 7.8*    CBC  Recent Labs Lab 08/28/15 1026 08/28/15 1041 08/29/15 0254 08/30/15 0336  WBC 18.5*  --  11.5* 8.3  HGB 15.7* 17.0* 11.2* 10.6*  HCT 45.3 50.0* 33.0* 33.0*  PLT 320  --  190 206    Coag's  Recent Labs Lab 08/28/15 1026  APTT 24  INR 1.17    Sepsis Markers  Recent Labs Lab 08/28/15 1256 08/28/15 1722 08/28/15 2038 08/29/15 1132 08/30/15 0336  LATICACIDVEN 2.08* 2.5* 2.5*  --   --   PROCALCITON  --   --   --  1.11 0.49    ABG  Recent Labs Lab 08/28/15 1050 08/29/15 0349  PHART 7.275* 7.316*  PCO2ART 39.7 39.5  PO2ART 455.0* 146*    Liver  Enzymes  Recent Labs Lab 08/28/15 1026  AST 29  ALT 13*  ALKPHOS 117  BILITOT 1.5*  ALBUMIN 4.0    Cardiac Enzymes No results for input(s): TROPONINI, PROBNP in the last 168 hours.  Glucose  Recent Labs Lab 08/29/15 1216 08/29/15 1525 08/29/15 1925 08/29/15 2309 08/30/15 0313 08/30/15 0734  GLUCAP 218* 183* 117* 135* 163* 191*    Imaging Dg Chest Port 1 View  08/30/2015  CLINICAL DATA:  75 year old female currently intubated EXAM: PORTABLE CHEST 1 VIEW COMPARISON:  Prior chest x-ray 08/29/2015 FINDINGS: The patient is intubated. The tip of the endotracheal tube is 4.8 cm above the carina. A nasogastric tube is present, the tip lies below the diaphragm off the field of view but likely within the stomach. Inspiratory volumes remain low. Improved aeration of the left lung base likely secondary to decreasing atelectasis. Persistent patchy airspace opacity in the right lung base. No large pleural effusion or pneumothorax. No suspicious nodule or mass. No acute osseous abnormality. IMPRESSION: 1. Stable and satisfactory support apparatus. 2. Decreasing atelectasis in the left lung base. 3. Persistent patchy airspace opacity in the right lung base which may reflect atelectasis or infiltrate. Electronically Signed   By: Jacqulynn Cadet M.D.   On:  08/30/2015 07:46   Ct Maxillofacial Wo Cm  09/09/2015  CLINICAL DATA:  Patient came in yesterday with AMS, SDH Scan showed possible left orbital fracture. Left eye is swollen and bruised EXAM: CT MAXILLOFACIAL WITHOUT CONTRAST TECHNIQUE: Multidetector CT imaging of the maxillofacial structures was performed. Multiplanar CT image reconstructions were also generated. A small metallic BB was placed on the right temple in order to reliably differentiate right from left. COMPARISON:  Head CT, 2015-09-09 at 4:45 a.m. FINDINGS: There is a depressed comminuted fracture of the left orbital floor. Fracture is depressed 9 mm. Oral fat extends through the  defect. There is no entrapment of the inferior rectus muscle. There is mild left lateral periorbital soft tissue edema. Patient has had a previous left cataract surgery. Left globe otherwise unremarkable. No abnormality of the postseptal left orbit. There are no other fractures. Dependent fluid is seen in the left maxillary sinus. Sinuses otherwise clear. Clear mastoid air cells and middle ear cavities. There are indwelling nasogastric and endotracheal tubes. No neck masses or adenopathy. IMPRESSION: 1. Depressed, comminuted left orbital floor fracture associated with dependent hemorrhage in the left maxillary sinus. No extraocular muscle entrapment. No other fractures. Electronically Signed   By: Lajean Manes M.D.   On: 09-09-2015 13:51    STUDIES:  1/08 CT head >> Lt SDH 09/08/22 CT maxillofacial >> depressed, comminuted Lt orbital fx with hemorrhage in Lt maxillary sinus  CULTURES: 1/08 Blood >> 1/08 Urine >> negative 1/08 Sputum >> rare Candida (colonization) 1/08 Influenza PCR >> negative  ANTIBIOTICS: 1/08 Vancomycin >> 1/08  1/08 Zosyn >>  SIGNIFICANT EVENTS: 1/08 Admit, neurosurgery consulted 1/10 ENT consulted  LINES/TUBES: 1/08 ETT >>  DISCUSSION: 75 yo female former smoker presented with altered mental status and fall at home.  Found to have UTI, lactic acidosis, fever and hyperglycemia.  CT head showed Lt SDH, Lt orbital fx.  She has PMhx of HTN, HLD, DM, Migraines, depression, anxiety.  ASSESSMENT / PLAN:  PULMONARY A: Acute respiratory failure 2nd to altered mental status, PNA. P:   Pressure support wean as tolerated F/u CXR  CARDIOVASCULAR A:  Sepsis 2nd to UTI >> improved. Hx of HTN, HLD. P:  KVO IV fluids Hold outpt norvasc, ASA, lisinopril, pravachol  RENAL A:   Anion gap metabolic acidosis >> resolved. Lactic acidosis >> resolved. P:   F/u BMET Monitor renal fx, urine outpt  GASTROINTESTINAL A:   Nutrition. Nausea with vomiting September 08, 2022. P:   Try  to resume tube feeds as tolerated 1/10 PRN zofran, phenergan Protonix for SUP  HEMATOLOGIC A:   Leukocytosis >> improved. P:  F/u CBC SCDs for DVT prevention  INFECTIOUS A:   Sepsis from UTI, PNA >> procalcitonin trending down. P:   Day 3 zosyn  ENDOCRINE A:   DM type II with hyperglycemia. P:   SSI with lantus Hold outpt levemir, glucophage  NEUROLOGIC A:  Acute encephalopathy 2nd to sepsis, Lt SDH >> UDS positive for THC.  Lt SDH. Hx of depression/anxiety. P:   RASS goal: -1 AEDs per neurology Hold outpt klonopin, neurontin, elavil, prozac  Trauma A: Lt orbital fx. P: D/w Dr. Benjamine Mola with ENT >> does not need acute intervention >> will need outpt follow up  CC time 32 minutes.  Chesley Mires, MD [facility] Pulmonary/Critical Care 08/30/2015, 9:49 AM Pager:  [phone] After 3pm call: [phone]

## 2015-08-30 NOTE — Progress Notes (Signed)
[place] Progress Note Patient Name: Margaret Fischer DOB: 09/26/1940 MRN: [id number]   Date of Service  08/30/2015  HPI/Events of Note  Patient with continued vomiting, TF stopped again  eICU Interventions  Check KUB     Intervention Category Major Interventions: Other:  Tequia Wolman 08/30/2015, 3:56 PM

## 2015-08-31 ENCOUNTER — Inpatient Hospital Stay (HOSPITAL_COMMUNITY): Payer: Medicare Other

## 2015-08-31 LAB — GLUCOSE, CAPILLARY
GLUCOSE-CAPILLARY: 110 mg/dL — AB (ref 65–99)
GLUCOSE-CAPILLARY: 152 mg/dL — AB (ref 65–99)
GLUCOSE-CAPILLARY: 121 mg/dL — AB (ref 65–99)
GLUCOSE-CAPILLARY: 121 mg/dL — AB (ref 65–99)
Glucose-Capillary: 121 mg/dL — ABNORMAL HIGH (ref 65–99)
Glucose-Capillary: 106 mg/dL — ABNORMAL HIGH (ref 65–99)
Glucose-Capillary: 139 mg/dL — ABNORMAL HIGH (ref 65–99)

## 2015-08-31 LAB — BASIC METABOLIC PANEL
ANION GAP: 8 (ref 5–15)
BUN: 18 mg/dL (ref 6–20)
CALCIUM: 8.1 mg/dL — AB (ref 8.9–10.3)
CO2: 26 mmol/L (ref 22–32)
Chloride: 107 mmol/L (ref 101–111)
Creatinine, Ser: 0.41 mg/dL — ABNORMAL LOW (ref 0.44–1.00)
Glucose, Bld: 157 mg/dL — ABNORMAL HIGH (ref 65–99)
POTASSIUM: 3.1 mmol/L — AB (ref 3.5–5.1)
Sodium: 141 mmol/L (ref 135–145)

## 2015-08-31 LAB — CBC
HEMATOCRIT: 31.6 % — AB (ref 36.0–46.0)
HEMOGLOBIN: 10.5 g/dL — AB (ref 12.0–15.0)
MCH: 32.3 pg (ref 26.0–34.0)
MCHC: 33.2 g/dL (ref 30.0–36.0)
MCV: 97.2 fL (ref 78.0–100.0)
Platelets: 227 10*3/uL (ref 150–400)
RBC: 3.25 MIL/uL — AB (ref 3.87–5.11)
RDW: 12.4 % (ref 11.5–15.5)
WBC: 6.5 10*3/uL (ref 4.0–10.5)

## 2015-08-31 LAB — PROCALCITONIN: PROCALCITONIN: 0.33 ng/mL

## 2015-08-31 MED ORDER — POTASSIUM CHLORIDE 10 MEQ/100ML IV SOLN
10.0000 meq | INTRAVENOUS | Status: AC
  Administered 2015-08-31 (×6): 10 meq via INTRAVENOUS
  Filled 2015-08-31 (×6): qty 100

## 2015-08-31 MED ORDER — ANTISEPTIC ORAL RINSE SOLUTION (CORINZ)
7.0000 mL | OROMUCOSAL | Status: DC
  Administered 2015-09-01 – 2015-09-03 (×11): 7 mL via OROMUCOSAL

## 2015-08-31 MED ORDER — HYDRALAZINE HCL 20 MG/ML IJ SOLN
10.0000 mg | INTRAMUSCULAR | Status: DC | PRN
  Administered 2015-08-31 – 2015-09-04 (×6): 10 mg via INTRAVENOUS
  Filled 2015-08-31 (×6): qty 1

## 2015-08-31 NOTE — Progress Notes (Signed)
Pharmacy Antibiotic Follow-up Note  Assessment/Plan: Margaret Fischer is a 75 y.o. year-old female admitted on 08/28/2015.  The patient is currently on day #4 of Zosyn for sepsis secondary to PNA / UTI. Cx's are still ngtd. Afebrile, WBC down to wnl. Lactic acid down to 2.5. SCr stable, CrCl ~75mL/min.  Plan: Continue Zosyn 3.375g IV q8h EI Monitor clinical picture, renal function F/U C&S, abx deescalation / LOT  Temp (24hrs), Avg:99.7 F (37.6 C), Min:99.3 F (37.4 C), Max:100 F (37.8 C)   Recent Labs Lab 08/28/15 1026 08/29/15 0254 08/30/15 0336 08/31/15 0242  WBC 18.5* 11.5* 8.3 6.5    Recent Labs Lab 08/28/15 1722 08/28/15 2038 08/29/15 0254 08/30/15 0336 08/31/15 0242  CREATININE 0.72 0.72 0.53 0.48 0.41*   Estimated Creatinine Clearance: 67.1 mL/min (by C-G formula based on Cr of 0.41).    Allergies  Allergen Reactions  . Epinephrine Other (See Comments)    Tachycardia when used as a local anesthetic  . Hydromorphone Other (See Comments)    Unknown reaction  . Morphine And Related Other (See Comments)    Unknown reaction  . Phenergan [Promethazine Hcl] Other (See Comments)    " GOES WACK YOU WITH THIS MEDICATION" STATEMENT MADE BY PATIENT  . Succinylcholine Other (See Comments)    Unknown reaction  . Latex Rash    Antimicrobials this admission: Vanc 1/8 >> 1/8 Zosyn 1/8 >>  Microbiology results: Blood cx 1/8 > ngtd Urine cx 1/8 > ngF Resp cx 1/8 > Rare candida albicans  Thank you for allowing pharmacy to be a part of this patient's care.  Elenor Quinones, PharmD, BCPS Clinical Pharmacist Pager [phone] 08/31/2015 8:31 AM

## 2015-08-31 NOTE — Progress Notes (Signed)
0230 pt HR brady in 30's and irregular rate. Eddie Dibbles NP at bedside. Orders given for 12 leak EKG.

## 2015-08-31 NOTE — Progress Notes (Signed)
[place] Progress Note Patient Name: Carisha Jesse Blanke DOB: 1940-09-09 MRN: [id number]   Date of Service  08/31/2015  HPI/Events of Note  Neurosx cleared patient for transfer to step down floor  eICU Interventions  Cam check - patient mildly somnolent, but appropriate, following commands, mild nasuea. Ok to transfer to step down unit.      Intervention Category Major Interventions: Change in mental status - evaluation and management  Uzoma Vivona 08/31/2015, 11:47 PM

## 2015-08-31 NOTE — Progress Notes (Signed)
PULMONARY / CRITICAL CARE MEDICINE   Name: Omie Galipeau Badeaux MRN: [id number] DOB: 1941/01/16    ADMISSION DATE:  08/28/2015  REFERRING MD:  Altered mental status  CHIEF COMPLAINT:  ER  SUBJECTIVE:  Follows commands on WUA. Reaching for ETT   VITAL SIGNS: BP 161/49 mmHg  Pulse 59  Temp(Src) 99.5 F (37.5 C) (Core (Comment))  Resp 15  Ht 5\' 6"  (1.676 m)  Wt 80.8 kg (178 lb 2.1 oz)  BMI 28.76 kg/m2  SpO2 100%  VENTILATOR SETTINGS: Vent Mode:  [-] PRVC FiO2 (%):  [40 %] 40 % Set Rate:  [14 bmp] 14 bmp Vt Set:  [500 mL] 500 mL PEEP:  [5 cmH20-6 cmH20] 5 cmH20 Plateau Pressure:  [7 cmH20-18 cmH20] 7 cmH20  INTAKE / OUTPUT: I/O last 3 completed shifts: In: 2932.9 [I.V.:1647.9; NG/GT:725; IV Piggyback:560] Out: 967 [Urine:966; Emesis/NG output:1]  PHYSICAL EXAMINATION: General: sedated Neuro:  RASS: 0-1 moves all ext  HEENT:  ecchymosis around Lt eye; orally intubated  Cardiovascular:  Regular, no murmur Lungs:  Scattered rhonchi no accessory muscle  Abdomen:  Soft, non tender Musculoskeletal:  No edema Skin:  Multiple areas of ecchymosis  LABS:  BMET  Recent Labs Lab 08/29/15 0254 08/30/15 0336 08/31/15 0242  NA 140 141 141  K 3.8 3.6 3.1*  CL 114* 110 107  CO2 18* 24 26  BUN 16 15 18   CREATININE 0.53 0.48 0.41*  GLUCOSE 133* 171* 157*    Electrolytes  Recent Labs Lab 08/29/15 0254 08/30/15 0336 08/31/15 0242  CALCIUM 7.4* 7.8* 8.1*    CBC  Recent Labs Lab 08/29/15 0254 08/30/15 0336 08/31/15 0242  WBC 11.5* 8.3 6.5  HGB 11.2* 10.6* 10.5*  HCT 33.0* 33.0* 31.6*  PLT 190 206 227    Coag's  Recent Labs Lab 08/28/15 1026  APTT 24  INR 1.17    Sepsis Markers  Recent Labs Lab 08/28/15 1256 08/28/15 1722 08/28/15 2038 08/29/15 1132 08/30/15 0336 08/31/15 0242  LATICACIDVEN 2.08* 2.5* 2.5*  --   --   --   PROCALCITON  --   --   --  1.11 0.49 0.33    ABG  Recent Labs Lab 08/28/15 1050 08/29/15 0349  PHART 7.275* 7.316*   PCO2ART 39.7 39.5  PO2ART 455.0* 146*    Liver Enzymes  Recent Labs Lab 08/28/15 1026  AST 29  ALT 13*  ALKPHOS 117  BILITOT 1.5*  ALBUMIN 4.0    Cardiac Enzymes No results for input(s): TROPONINI, PROBNP in the last 168 hours.  Glucose  Recent Labs Lab 08/30/15 1224 08/30/15 1557 08/30/15 1940 08/30/15 2320 08/31/15 0358 08/31/15 0848  GLUCAP 172* 164* 156* 110* 152* 121*    Imaging Dg Abd 1 View  08/30/2015  CLINICAL DATA:  Nausea, vomiting. EXAM: ABDOMEN - 1 VIEW COMPARISON:  08/28/2015 FINDINGS: The bowel gas pattern is normal. No radio-opaque calculi or other significant radiographic abnormality are seen. Enteric catheter overlies the expected location of the gastric body. No radiographic evidence of organomegaly.Cholecystectomy clips are noted. Rectal probe is in place. Postsurgical clips overlie the right groin. IMPRESSION: Nonobstructive bowel gas pattern. Enteric catheter with distal tip at the expected location of gastric body. Electronically Signed   By: Fidela Salisbury M.D.   On: 08/30/2015 16:29   Dg Chest Port 1 View  08/31/2015  CLINICAL DATA:  Respiratory failure EXAM: PORTABLE CHEST 1 VIEW COMPARISON:  08/30/2015 FINDINGS: Endotracheal tube tip 5.3 cm above the carina. Nasogastric tube enters the stomach. Borderline enlargement  of the cardiopericardial silhouette with atherosclerotic calcification of the aortic arch. Blunted left costophrenic angle. Improved aeration at the right lung base. IMPRESSION: 1. Blunting of the left costophrenic angle-left pleural effusion not excluded. 2. Improved aeration at the right lung base. 3. Otherwise stable. Electronically Signed   By: Van Clines M.D.   On: 08/31/2015 07:17  improved right LL aeration, LLL ATX/effusion  STUDIES:  1/08 CT head >> Lt SDH September 09, 2022 CT maxillofacial >> depressed, comminuted Lt orbital fx with hemorrhage in Lt maxillary sinus  CULTURES: 1/08 Blood >> 1/08 Urine >> negative 1/08  Sputum >> rare Candida (colonization) 1/08 Influenza PCR >> negative  ANTIBIOTICS: 1/08 Vancomycin >> 1/08  1/08 Zosyn >>  SIGNIFICANT EVENTS: 1/08 Admit, neurosurgery consulted 1/10 ENT consulted  LINES/TUBES: 1/08 ETT >>  DISCUSSION: 75 yo female former smoker presented with altered mental status and fall at home.  Found to have UTI, lactic acidosis, fever and hyperglycemia.  CT head showed Lt SDH, Lt orbital fx. CXR improved; F/vt acceptable. A little agitated; but think mostly just reaching for ETT> she should be ready for extubation this am. Will limit sedation, keep NPO post-extubation, cont abx and plan for repeat CT scan head tomorrow as per neuro-surg.   She has PMhx of HTN, HLD, DM, Migraines, depression, anxiety.  ASSESSMENT / PLAN:  PULMONARY A: Acute respiratory failure 2nd to altered mental status, PNA. Excellent f/vt CXR improved.  P:   Dc fentanyl SBT Hope to extubate this am NPO Will need swallow eval.    CARDIOVASCULAR A:  Sepsis 2nd to UTI >> improved. Hx of HTN, HLD. P:  KVO IV fluids Hold outpt norvasc, ASA, lisinopril, pravachol  RENAL A:   Anion gap metabolic acidosis >> resolved. Lactic acidosis >> resolved. Hypokalemia  P:   F/u BMET Replace K Monitor renal fx, urine outpt  GASTROINTESTINAL A:   Nutrition. Nausea with vomiting September 09, 2022. P:   NPO for now-->possible extubation  PRN zofran, phenergan Protonix for SUP  HEMATOLOGIC A:   Leukocytosis >> improved. P:  F/u CBC SCDs for DVT prevention  INFECTIOUS A:   Sepsis from UTI, PNA >> procalcitonin trending down. P:   Day 4/7 zosyn  ENDOCRINE A:   DM type II with hyperglycemia. P:   SSI with lantus Hold outpt levemir, glucophage  NEUROLOGIC A:  Acute encephalopathy 2nd to sepsis, Lt SDH >> UDS positive for THC.  Lt SDH. Hx of depression/anxiety. Agitation P:   RASS goal: -0 AEDs per neurology Hold outpt klonopin, neurontin, elavil, prozac Will see how she  looks post-extubation; may consider resuming elavil given risk of w/d  Trauma A: Lt orbital fx. P: D/w Dr. Benjamine Mola with ENT >> does not need acute intervention >> will need outpt follow up  Erick Colace ACNP-BC [place] Pager # [phone] OR # [phone] if no answer

## 2015-08-31 NOTE — Progress Notes (Signed)
Extubated AVSS Awake and alert Follows commands throughout Stable Repeat head CT Friday morning at 0400, sooner if neurological decline occurs

## 2015-08-31 NOTE — Progress Notes (Signed)
Fentanyl 50 ml, with a concentration of 2500 mcg/250 ml, was wasted and witnessed by B. Colbertson, Therapist, sports. Will continue to monitor.

## 2015-08-31 NOTE — Procedures (Signed)
Extubation Procedure Note  Patient Details:   Name: Margaret Fischer DOB: 1940/09/13 MRN: [id number]   Airway Documentation:     Evaluation  O2 sats: stable throughout Complications: No apparent complications Patient did tolerate procedure well. Bilateral Breath Sounds: Rhonchi Suctioning: Oral, Airway Yes   Pt extubated to 3L [place] per MD order. Pt able to speak softly and has weak congested cough. Coarse Bs throughout pt stable with no complications. Rt will continue to monitor  Jesse Sans 08/31/2015, 11:07 AM

## 2015-09-01 DIAGNOSIS — E785 Hyperlipidemia, unspecified: Secondary | ICD-10-CM

## 2015-09-01 DIAGNOSIS — I10 Essential (primary) hypertension: Secondary | ICD-10-CM

## 2015-09-01 LAB — GLUCOSE, CAPILLARY
GLUCOSE-CAPILLARY: 153 mg/dL — AB (ref 65–99)
GLUCOSE-CAPILLARY: 157 mg/dL — AB (ref 65–99)
Glucose-Capillary: 134 mg/dL — ABNORMAL HIGH (ref 65–99)
Glucose-Capillary: 182 mg/dL — ABNORMAL HIGH (ref 65–99)
Glucose-Capillary: 161 mg/dL — ABNORMAL HIGH (ref 65–99)

## 2015-09-01 MED ORDER — BENZONATATE 100 MG PO CAPS
100.0000 mg | ORAL_CAPSULE | Freq: Three times a day (TID) | ORAL | Status: DC | PRN
  Administered 2015-09-01 – 2015-09-06 (×9): 100 mg via ORAL
  Filled 2015-09-01 (×9): qty 1

## 2015-09-01 MED ORDER — CHLORHEXIDINE GLUCONATE 0.12 % MT SOLN
15.0000 mL | Freq: Two times a day (BID) | OROMUCOSAL | Status: DC
  Administered 2015-09-01 – 2015-09-02 (×3): 15 mL via OROMUCOSAL
  Filled 2015-09-01 (×3): qty 15

## 2015-09-01 MED ORDER — ALBUTEROL SULFATE (2.5 MG/3ML) 0.083% IN NEBU
2.5000 mg | INHALATION_SOLUTION | RESPIRATORY_TRACT | Status: DC | PRN
  Administered 2015-09-01 (×2): 2.5 mg via RESPIRATORY_TRACT
  Filled 2015-09-01: qty 3

## 2015-09-01 MED ORDER — PRAVASTATIN SODIUM 40 MG PO TABS
40.0000 mg | ORAL_TABLET | Freq: Every day | ORAL | Status: DC
  Administered 2015-09-01 – 2015-09-03 (×3): 40 mg via ORAL
  Filled 2015-09-01 (×3): qty 1

## 2015-09-01 MED ORDER — ALBUTEROL SULFATE (2.5 MG/3ML) 0.083% IN NEBU
INHALATION_SOLUTION | RESPIRATORY_TRACT | Status: AC
Start: 2015-09-01 — End: 2015-09-01
  Filled 2015-09-01: qty 3

## 2015-09-01 MED ORDER — INSULIN ASPART 100 UNIT/ML ~~LOC~~ SOLN
0.0000 [IU] | Freq: Every day | SUBCUTANEOUS | Status: DC
  Administered 2015-09-02: 3 [IU] via SUBCUTANEOUS

## 2015-09-01 MED ORDER — ONDANSETRON HCL 4 MG/2ML IJ SOLN
4.0000 mg | Freq: Four times a day (QID) | INTRAMUSCULAR | Status: DC | PRN
  Administered 2015-09-01 – 2015-09-06 (×12): 4 mg via INTRAVENOUS
  Filled 2015-09-01 (×13): qty 2

## 2015-09-01 MED ORDER — INSULIN ASPART 100 UNIT/ML ~~LOC~~ SOLN
0.0000 [IU] | Freq: Three times a day (TID) | SUBCUTANEOUS | Status: DC
  Administered 2015-09-01: 2 [IU] via SUBCUTANEOUS
  Administered 2015-09-02: 1 [IU] via SUBCUTANEOUS
  Administered 2015-09-02: 3 [IU] via SUBCUTANEOUS
  Administered 2015-09-02: 2 [IU] via SUBCUTANEOUS
  Administered 2015-09-03: 1 [IU] via SUBCUTANEOUS
  Administered 2015-09-03: 2 [IU] via SUBCUTANEOUS

## 2015-09-01 MED ORDER — CETYLPYRIDINIUM CHLORIDE 0.05 % MT LIQD
7.0000 mL | Freq: Two times a day (BID) | OROMUCOSAL | Status: DC
  Administered 2015-09-01 – 2015-09-06 (×7): 7 mL via OROMUCOSAL

## 2015-09-01 MED ORDER — AMLODIPINE BESYLATE 5 MG PO TABS
5.0000 mg | ORAL_TABLET | Freq: Every day | ORAL | Status: DC
  Administered 2015-09-01 – 2015-09-07 (×7): 5 mg via ORAL
  Filled 2015-09-01 (×7): qty 1

## 2015-09-01 NOTE — Progress Notes (Signed)
Hydralazine 10 mg give for SBP over 160 per Md order.

## 2015-09-01 NOTE — Progress Notes (Signed)
UR COMPLETED  

## 2015-09-01 NOTE — Care Management Important Message (Signed)
Important Message  Patient Details  Name: Margaret Fischer MRN: [id number] Date of Birth: [birth year]   Medicare Important Message Given:  Yes    [name] May 09/01/2015, 12:19 PM

## 2015-09-01 NOTE — Progress Notes (Signed)
Report given to 3S.

## 2015-09-01 NOTE — Evaluation (Signed)
Clinical/Bedside Swallow Evaluation Patient Details  Name: Margaret Fischer MRN: [id number] Date of Birth: Apr 21, 1941  Today's Date: 09/01/2015 Time: SLP Start Time (ACUTE ONLY): 0910 SLP Stop Time (ACUTE ONLY): 0924 SLP Time Calculation (min) (ACUTE ONLY): 14 min  Past Medical History:  Past Medical History  Diagnosis Date  . Diabetes mellitus     Insulin dependent  . Chest pain, non-cardiac     History of 2 normal cardiac catheterizations  . Bradycardia   . Dizziness     Chronic  . Fibromyalgia   . Neuropathy ([place])   . Chronic chest pain   . Hyperlipidemia   . Chronic neck pain   . Chronic back pain   . Basal cell carcinoma of nose   . Depression   . Thyroid cyst   . Goiter   . 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 Hypertension   . Depression   . Anxiety   . Migraine    Past Surgical History:  Past Surgical History  Procedure Laterality Date  . Total knee arthroplasty    . Cardiac catheterization      History of 2 caths, reportedly normal  . Joint replacement    . Rotator cuff repair    . Appendectomy    . Cystectomy    . Thyroid surgery    . Breast lumpectomy    . Abdominal hysterectomy    . Biceps tendon repair    . Refractive surgery    . Right nares  09/2011    basal cell surgery  . Cholecystectomy    . Vesicovaginal fistula closure w/ tah    . Lasik     HPI:  75 yo female former smoker presented with altered mental status and fall at home. Found to have UTI, lactic acidosis, fever and hyperglycemia. CT head showed Lt SDH, Lt orbital fx. She was intubated 1/8-1/11.   Assessment / Plan / Recommendation Clinical Impression  Pt has a baseline cough that is observed prior to PO intake and then again after SLP left the room. No coughing or changes in vocal quality noted throughout intake including challenging with large, consecutive straw sips of thin liquids. Swallow appears to occur swiftly and with adequate hyolaryngeal movement upon palpation. Pt does  have difficulty with mastication and a/p transfer of soft solids likely due to a combination of missing dentition and current mentation. She needs Mod-Max cues and multiple puree and liquid boluses in order to clear. Recommend initiation of Dys 1 diet and thin liquids. SLP to f/u for advancement as mentation improves and/or dentures become available.    Aspiration Risk  Mild aspiration risk    Diet Recommendation  Dys 1 diet, thin liquids Full supervision   Medication Administration: Crushed with puree    Other  Recommendations Oral Care Recommendations: Oral care BID   Follow up Recommendations   (tba)    Frequency and Duration min 2x/week  2 weeks       Prognosis Prognosis for Safe Diet Advancement: Good Barriers to Reach Goals: Cognitive deficits      Swallow Study   General Date of Onset: 08/28/15 HPI: 75 yo female former smoker presented with altered mental status and fall at home. Found to have UTI, lactic acidosis, fever and hyperglycemia. CT head showed Lt SDH, Lt orbital fx. She was intubated 1/8-1/11. Type of Study: Bedside Swallow Evaluation Previous Swallow Assessment: none in chart Diet Prior to this Study: NPO Temperature Spikes Noted: Yes (99.7)  Respiratory Status: Room air History of Recent Intubation: Yes Length of Intubations (days): 3 days Date extubated: 08/31/15 Behavior/Cognition: Lethargic/Drowsy;Requires cueing;Cooperative Oral Cavity Assessment: Within Functional Limits Oral Care Completed by SLP: No Oral Cavity - Dentition: Edentulous;Dentures, not available Vision: Functional for self-feeding Self-Feeding Abilities: Able to feed self;Needs assist Patient Positioning: Upright in bed Baseline Vocal Quality: Low vocal intensity Volitional Cough: Weak (reflexive is weak)    Oral/Motor/Sensory Function Overall Oral Motor/Sensory Function: Generalized oral weakness   Ice Chips Ice chips: Within functional limits Presentation: Spoon   Thin Liquid  Thin Liquid: Within functional limits Presentation: Cup;Self Fed;Straw    Nectar Thick Nectar Thick Liquid: Not tested   Honey Thick Honey Thick Liquid: Not tested   Puree Puree: Within functional limits Presentation: Spoon   Solid   GO    Solid: Impaired Oral Phase Impairments: Impaired mastication;Poor awareness of bolus Oral Phase Functional Implications: Oral residue      Margaret Fischer, M.A. CCC-SLP [phone]  Margaret Fischer 09/01/2015,9:35 AM

## 2015-09-01 NOTE — Progress Notes (Signed)
While at pt bedside, pt vomited yellow bile and HR dropped to 30's nonsustained. HR returned to NSR 90-110's. MD notified. Zofran ordered. Will continue to monitor.

## 2015-09-01 NOTE — Progress Notes (Signed)
Nutrition Follow-up  DOCUMENTATION CODES:   Not applicable  INTERVENTION:    Ensure Enlive PO BID, each supplement provides 350 kcal and 20 grams of protein  NUTRITION DIAGNOSIS:   Inadequate oral intake related to dysphagia, poor appetite as evidenced by meal completion < 50%.  Ongoing  GOAL:   Patient will meet greater than or equal to 90% of their needs  Unmet  MONITOR:   PO intake, Supplement acceptance, Labs, Weight trends  REASON FOR ASSESSMENT:   Consult Enteral/tube feeding initiation and management  ASSESSMENT:   Pt with hx of HTN, HLD, DM admitted from home with altered mental status and fall. Pt found to have UTI, lactic acidosis, fever, hyperglycemia and per CT left SDH.   Patient was extubated on 1/11. TF off since extubation. S/P SLP evaluation this AM, diet advanced to dysphagia 1 with thin liquids. Only a few bites of lunch meal consumed today. Patient said she got sick on her stomach. She thinks she has had protein drinks before, but can't remember the name; agreed to try Ensure Enlive.  Diet Order:  DIET - DYS 1 Room service appropriate?: Yes; Fluid consistency:: Thin  Skin:  Reviewed, no issues  Last BM:  1/8  Height:   Ht Readings from Last 1 Encounters:  09/01/15 5\' 8"  (1.727 m)    Weight:   Wt Readings from Last 1 Encounters:  09/01/15 175 lb 4.3 oz (79.5 kg)    Ideal Body Weight:  59 kg  BMI:  Body mass index is 26.66 kg/(m^2).  Estimated Nutritional Needs:   Kcal:  1700-1900  Protein:  85-100 gm  Fluid:  1.7-1.9 L  EDUCATION NEEDS:   No education needs identified at this time  Molli Barrows, [place], [place], [place] Pager [phone] After Hours Pager [phone]

## 2015-09-01 NOTE — Progress Notes (Signed)
No acute events AVSS Awake and alert Follows commands throughout Generally weak but no focal deficits Stable Repeat CT Head at 0400 tomorrow morning

## 2015-09-01 NOTE — Progress Notes (Signed)
PULMONARY / CRITICAL CARE MEDICINE   Name: Margaret Fischer MRN: [id number] DOB: 1940-08-21    ADMISSION DATE:  08/28/2015  REFERRING MD:  EDP  CHIEF COMPLAINT:  Altered Mental Status  STUDIES:  CT HEAD 1/8:  Left subdural hematoma w/ local mass effect & 17mm left to right midline shift.  CT HEAD 1/9:  Left subdural hematoma w/ 55mm left to right midline shift & trace subarachnoid hemorharrage on the left.  CT MAXILLOFACIAL 1/9: Depressed, comminuted Lt orbital fx with hemorrhage in Lt maxillary sinus Port CXR 1/11:  Personally reviewed by me. Blunting of left costophrenic angle worsening from possible effusion. Intubated. Slight improvement in right basilar hazy opacity. Right hilar opacity persists. No appreciated fractures.  MICROBIOLOGY: Blood Ctx x2 1/8>>> Urine Ctx 1/8:  Negative  Trach Asp Ctx 1/8:  Candida albicans Influenza PCR:  Negative   ANTIBIOTICS: Zosyn 1/8 >> Vancomycin >> 1/08   SIGNIFICANT EVENTS: 1/08 - Admit, neurosurgery consulted 1/10 - ENT consulted  LINES/TUBES: PIV x2 OETT 1/8 - 1/11  SUBJECTIVE:  Patient having intermittent coughing. Breaking ok. Denies any chest pain or pressure. Denies any headaches.  REVIEW OF SYSTEMS:  No nausea, emesis, or abdominal pain. No fever, chills, or sweats.  VITAL SIGNS: BP 154/58 mmHg  Pulse 90  Temp(Src) 98 F (36.7 C) (Oral)  Resp 17  Ht 5\' 8"  (1.727 m)  Wt 175 lb 4.3 oz (79.5 kg)  BMI 26.66 kg/m2  SpO2 99%  VENTILATOR SETTINGS:    INTAKE / OUTPUT: I/O last 3 completed shifts: In: [id number] [I.V.:650; NG/GT:60; IV Piggyback:710] Out: [id number] [Urine:1541]  PHYSICAL EXAMINATION: General:  Awake. Alert. Laying on the right side. Husband at bedside.  Integument:  Warm & dry. No rash on exposed skin.  HEENT:  Moist mucus membranes. No oral ulcers.  Cardiovascular:  Regular rate. No edema. No appreciable JVD.  Pulmonary:  Good aeration & clear to auscultation bilaterally. Symmetric chest wall expansion. No accessory  muscle use on nasal cannula oxygen. Abdomen: Soft. Normal bowel sounds. Nondistended. Grossly nontender. Neurological: Oriented to person, place, and time. Moving all 4 extremities equally. Strength symmetric. Grossly nonfocal.  LABS:  BMET  Recent Labs Lab 08/29/15 0254 08/30/15 0336 08/31/15 0242  NA 140 141 141  K 3.8 3.6 3.1*  CL 114* 110 107  CO2 18* 24 26  BUN 16 15 18   CREATININE 0.53 0.48 0.41*  GLUCOSE 133* 171* 157*    Electrolytes  Recent Labs Lab 08/29/15 0254 08/30/15 0336 08/31/15 0242  CALCIUM 7.4* 7.8* 8.1*    CBC  Recent Labs Lab 08/29/15 0254 08/30/15 0336 08/31/15 0242  WBC 11.5* 8.3 6.5  HGB 11.2* 10.6* 10.5*  HCT 33.0* 33.0* 31.6*  PLT 190 206 227    Coag's  Recent Labs Lab 08/28/15 1026  APTT 24  INR 1.17    Sepsis Markers  Recent Labs Lab 08/28/15 1256 08/28/15 1722 08/28/15 2038 08/29/15 1132 08/30/15 0336 08/31/15 0242  LATICACIDVEN 2.08* 2.5* 2.5*  --   --   --   PROCALCITON  --   --   --  1.11 0.49 0.33    ABG  Recent Labs Lab 08/28/15 1050 08/29/15 0349  PHART 7.275* 7.316*  PCO2ART 39.7 39.5  PO2ART 455.0* 146*    Liver Enzymes  Recent Labs Lab 08/28/15 1026  AST 29  ALT 13*  ALKPHOS 117  BILITOT 1.5*  ALBUMIN 4.0    Cardiac Enzymes No results for input(s): TROPONINI, PROBNP in the last 168 hours.  Glucose  Recent Labs Lab 08/31/15 0848 08/31/15 1240 08/31/15 1606 08/31/15 1926 08/31/15 2316 09/01/15 0450  GLUCAP 121* 121* 121* 106* 139* 134*    ASSESSMENT / PLAN:  75 yo female former smoker with h/of HTN, HLD, DM, Migraines, depression, & anxiety who presented with altered mental status and fall at home.  Patient successfully extubated on 1/11. Found to have left sided subdural hematoma with minimal midline shift that has been stable. Encephalopathy seems to be improving. Likely some element of healthcare associated pneumonia given chest x-ray findings. It's unlikely the patient  has a urinary tract infection given negative culture on admission. Patient has been cleared for diet by speech therapy. Transitioning insulin over to Accu-Cheks every before meals & at bedtime. Suspect her ongoing cough is likely a component of her recent intubation as well as her healthcare associated pneumonia.  1. Acute Respiratory Failure:  Improving. Extubated 1/11. Secondary to altered mental status & pneumonia. Albuterol neb prn. 2. Left Subdural Hematoma:  Neurosurgery following. Minimal midline shift. Plan for CT Head without contrast tomorrow AM (1/13). 3. Sepsis:  Resolved. Secondary to UTI versus Pneumonia. 4. Acute Encephalopathy:  Secondary to SDH vs drug use (UDS positive for THC). Possible toxic metabolic encephalopathy in setting of Sepsis. 5. Left Orbital Fracture:  Discussed previously with Dr. Benjamine Mola. No acute intervention needed. Plan for outpatient follow-up with ENT. 6. Diabetes Mellitus Type II:  Holding outpatient Levemir & Glucophage. Continuing Lantus & switching Accu-Cheks to every before meals and at bedtime with low-dose sliding scale algorithm. 7. Unlikely UTI:  Currently on Day #5/7 of Zosyn. Culture on admission negative. 8. H/O HTN:  Hydralazine IV prn and restarting Norvasc 5 mg by mouth daily.. 9. H/O Depression & Anxiety:  Holding outpatient Klonopin, Neurontin, Elavil, & Prozac. 10. H/O Hyperlipidemia: Restarting home Pravachol 40 mg by mouth daily. 11. Diet:  Previously N/V on 1/9. Speech cleared for diet & ordered. 12. Prophylaxis:  SCDs.  Transferring care to [place] signing off 1/13.  Sonia Baller Ashok Cordia, M.D. [facility] Pager:  [phone] After 3pm or if no response, call [phone]

## 2015-09-02 ENCOUNTER — Inpatient Hospital Stay (HOSPITAL_COMMUNITY): Payer: Medicare Other

## 2015-09-02 DIAGNOSIS — E876 Hypokalemia: Secondary | ICD-10-CM | POA: Diagnosis present

## 2015-09-02 DIAGNOSIS — I10 Essential (primary) hypertension: Secondary | ICD-10-CM | POA: Diagnosis present

## 2015-09-02 DIAGNOSIS — S0285XA Fracture of orbit, unspecified, initial encounter for closed fracture: Secondary | ICD-10-CM | POA: Diagnosis present

## 2015-09-02 DIAGNOSIS — E785 Hyperlipidemia, unspecified: Secondary | ICD-10-CM | POA: Diagnosis present

## 2015-09-02 DIAGNOSIS — S0280XA Fracture of other specified skull and facial bones, unspecified side, initial encounter for closed fracture: Secondary | ICD-10-CM

## 2015-09-02 LAB — CBC
HCT: 34 % — ABNORMAL LOW (ref 36.0–46.0)
HEMOGLOBIN: 11.5 g/dL — AB (ref 12.0–15.0)
MCH: 32.3 pg (ref 26.0–34.0)
MCHC: 33.8 g/dL (ref 30.0–36.0)
MCV: 95.5 fL (ref 78.0–100.0)
PLATELETS: 324 10*3/uL (ref 150–400)
RBC: 3.56 MIL/uL — AB (ref 3.87–5.11)
RDW: 12.3 % (ref 11.5–15.5)
WBC: 8 10*3/uL (ref 4.0–10.5)

## 2015-09-02 LAB — CULTURE, BLOOD (ROUTINE X 2)
Culture: NO GROWTH
Culture: NO GROWTH

## 2015-09-02 LAB — BASIC METABOLIC PANEL
ANION GAP: 13 (ref 5–15)
Anion gap: 13 (ref 5–15)
BUN: 7 mg/dL (ref 6–20)
CALCIUM: 8.6 mg/dL — AB (ref 8.9–10.3)
CHLORIDE: 98 mmol/L — AB (ref 101–111)
CO2: 27 mmol/L (ref 22–32)
CO2: 26 mmol/L (ref 22–32)
CREATININE: 0.52 mg/dL (ref 0.44–1.00)
Calcium: 8.3 mg/dL — ABNORMAL LOW (ref 8.9–10.3)
Chloride: 99 mmol/L — ABNORMAL LOW (ref 101–111)
Creatinine, Ser: 0.59 mg/dL (ref 0.44–1.00)
GFR calc Af Amer: >60 mL/min (ref >60)
GFR calc non Af Amer: >60 mL/min (ref >60)
GLUCOSE: 268 mg/dL — AB (ref 65–99)
Glucose, Bld: 150 mg/dL — ABNORMAL HIGH (ref 65–99)
Potassium: 2.6 mmol/L — CL (ref 3.5–5.1)
Potassium: 2.7 mmol/L — CL (ref 3.5–5.1)
SODIUM: 138 mmol/L (ref 135–145)
SODIUM: 138 mmol/L (ref 135–145)

## 2015-09-02 LAB — GLUCOSE, CAPILLARY
GLUCOSE-CAPILLARY: 128 mg/dL — AB (ref 65–99)
GLUCOSE-CAPILLARY: 155 mg/dL — AB (ref 65–99)
GLUCOSE-CAPILLARY: 258 mg/dL — AB (ref 65–99)
Glucose-Capillary: 234 mg/dL — ABNORMAL HIGH (ref 65–99)

## 2015-09-02 LAB — MAGNESIUM: MAGNESIUM: 1.6 mg/dL — AB (ref 1.7–2.4)

## 2015-09-02 LAB — POTASSIUM: POTASSIUM: 3 mmol/L — AB (ref 3.5–5.1)

## 2015-09-02 MED ORDER — INSULIN GLARGINE 100 UNIT/ML ~~LOC~~ SOLN
20.0000 [IU] | Freq: Every day | SUBCUTANEOUS | Status: DC
  Administered 2015-09-03 – 2015-09-04 (×2): 20 [IU] via SUBCUTANEOUS
  Filled 2015-09-02 (×3): qty 0.2

## 2015-09-02 MED ORDER — ACETAMINOPHEN 325 MG PO TABS
650.0000 mg | ORAL_TABLET | Freq: Four times a day (QID) | ORAL | Status: DC | PRN
  Administered 2015-09-02 – 2015-09-07 (×14): 650 mg via ORAL
  Filled 2015-09-02 (×14): qty 2

## 2015-09-02 MED ORDER — POTASSIUM CHLORIDE CRYS ER 20 MEQ PO TBCR
40.0000 meq | EXTENDED_RELEASE_TABLET | ORAL | Status: AC
  Administered 2015-09-02 (×2): 40 meq via ORAL
  Filled 2015-09-02 (×2): qty 2

## 2015-09-02 MED ORDER — MAGNESIUM SULFATE 50 % IJ SOLN
3.0000 g | Freq: Once | INTRAVENOUS | Status: AC
  Administered 2015-09-02: 3 g via INTRAVENOUS
  Filled 2015-09-02: qty 6

## 2015-09-02 NOTE — Progress Notes (Signed)
No acute events ASS Awake and alert Moves all extremities well No drift CT Head: Stable hematoma Doing well Will sign off for now Please have patient follow up with me in 3 weeks with a non-contrast head CT Feel free to call with questions

## 2015-09-02 NOTE — Progress Notes (Signed)
CRITICAL VALUE ALERT  Critical value received:  Potassium 2.7  Date of notification:  09/02/15  Time of notification:  [id number]  Critical value read back:Yes.    Nurse who received alert:  Zigmund Gottron  MD notified (1st page):  Woods  Time of first page:  1233  MD notified (2nd page):  Time of second page:  Responding MD:  Sherral Hammers  Time MD responded:  [id number]

## 2015-09-02 NOTE — Progress Notes (Signed)
[facility] TEAM 1 - Stepdown/ICU TEAM Progress Note  Margaret Fischer [id number] DOB: 1941/08/15 DOA: 08/28/2015 PCP: Delia Chimes, NP  Admit HPI / Brief Narrative: 75 y.o.WF  PMHx CVA, Depression, Anxiety, Fibromyalgia HTN, Bradycardia, Chronic Chest Pain HLD, DM type II with Neuropathy, Migraines, Chronic Pain Syndrome, Basal Cell Carcinoma Nose, Drug Abuse (marijuana),  Presents to the ED by EMS w/ complaints of confusion. Per chart review and sn, patient has been somewhat confused over the past few days (lives with sister/daughhter) and was apparently naked and further confused this AM. She was found to have stool and vomit in her bed today. Son states that the patient thought she had a UTI a few days ago as this has been an issue for herr in the past. She also reportedly fell, hitting her left face and head on her fall. EMS was called, taken to the ED. Of note, patient was noted to have CBG's in the 400's, lactic acidosis of 4.5, fever, tachycardia, and CT head showing left subdural hematoma.   HPI/Subjective: 1/13 A/O 4, C/O headache behind her left eye radiating to the parietal area. States unsure how much marijuana she did smoke and alcohol she had drunk prior to her fall. States she was with her daughter.  Assessment/Plan: Acute Respiratory Failure: -Resolved  Left Subdural Hematoma:  -Neurosurgery following. Minimal midline shift.  -1/13 CT Head without contrast. Patient's SDH improving but still some minimal shift see results below -Neurosurgery signed off today Dr.Benjamin Jared Ditty neurosurgery. patient follow up with him in 3 weeks with a non-contrast head CT -PT/OT consult pending  Acute Encephalopathy:  -Multifactorial to include SDH, Drug use (UDS positive for THC).  -Resolved   Sepsis:  -Don't believe patient had true infection will DC all antibiotics .  Drug abuse (UDS positive marijuana)  Left Orbital Fracture:  -Per [facility] M note Discussed Dr. Benjamine Mola. No acute  intervention needed.  -Plan for outpatient follow-up with ENT.  Diabetes Mellitus Type II:  -Holding outpatient Levemir & Glucophage.  -Increase Lantus 20 units daily  -Continue sensitive SSI  HTN:  -Allow permissive HTN goal of SBP > 140 -160  -Norvasc 5 mg daily  -Hydralazine IV prn   Depression & Anxiety:  -By [facility] does not appear patient was using Prozac -Continue to Hold outpatient Neurontin, Elavil,  -Restart Clonazepam 2 mg BID PRN anxiety  HLD -Pravachol 40 mg by mouth daily  Hypokalemia -K Dur 40 mEq 2 doses  Hypomagnesemia -Magnesium IV 3 gm    Code Status: FULL Family Communication: no family present at time of exam Disposition Plan: SNF vs CIR    Consultants: Dr. Benjamine Mola ENT Dr.Benjamin Kevan Ny Ditty neurosurgery   Procedure/Significant Events: CT HEAD 1/8: Left subdural hematoma w/ local mass effect & 60mm left to right midline shift.  CT HEAD 1/9: Left subdural hematoma w/ 43mm left to right midline shift & trace subarachnoid hemorharrage on the left.  CT MAXILLOFACIAL 1/9: Depressed, comminuted Lt orbital fx with hemorrhage in Lt maxillary sinus Port CXR 1/11: Personally reviewed by me. Blunting of left costophrenic angle worsening from possible effusion. Intubated. Slight improvement in right basilar hazy opacity. Right hilar opacity persists. No appreciated fractures. 1/13 CT head without contrast; Slightly decreased size of left subdural hematoma, now measuring1 cm in maximal thickness. -4-5 mm of left-to-right shift. No hydrocephalus or evidence of ventricular trapping.    Culture 1/8 Blood left forearm 2 negative final 1/8 Urine Ctx >> Negative final 1/8 MRSA by PCR negative  1/8  Trach Asp Ctx positive rare Candida albicans 1/8 influenza A/B/H1N1 Negative   Antibiotics: Zosyn 1/8 >> 1/13 Vancomycin >> 1/08    DVT prophylaxis: SCD   Devices    LINES / TUBES:  PIV x2 OETT 1/8 - 1/11    Continuous Infusions: . dextrose 10  mL/hr at 09/01/15 0100    Objective: VITAL SIGNS: Temp: 98.5 F (36.9 C) (01/13 1500) Temp Source: Oral (01/13 1500) BP: 150/63 mmHg (01/13 1500) Pulse Rate: 85 (01/13 1500) SPO2; FIO2:   Intake/Output Summary (Last 24 hours) at 09/02/15 1846 Last data filed at 09/02/15 1800  Gross per 24 hour  Intake    360 ml  Output   2950 ml  Net  -2590 ml     Exam: General:A/O 4, C/O headache behind her left eye radiating to the parietal area, No acute respiratory distress Eyes: Positive headache, eye pain, negative double vision,negative scleral hemorrhage ENT: Negative Runny nose, negative gingival bleeding, Neck:  Negative scars, masses, torticollis, lymphadenopathy, positive JVD Lungs: Clear to auscultation bilaterally without wheezes or crackles Cardiovascular: Regular rate and rhythm without murmur gallop or rub normal S1 and S2 Abdomen:negative abdominal pain, nondistended, positive soft, bowel sounds, no rebound, no ascites, no appreciable mass Extremities: No significant cyanosis, clubbing, or edema bilateral lower extremities Psychiatric:  Negative depression, negative anxiety, negative fatigue, negative mania, flat affect  Neurologic:  Cranial nerves II through XII intact, tongue/uvula midline, all extremities muscle strength 5/5, sensation intact throughout,  negative dysarthria, negative expressive aphasia, negative receptive aphasia.   Data Reviewed: Basic Metabolic Panel:  Recent Labs Lab 08/29/15 0254 08/30/15 0336 08/31/15 0242 09/02/15 0532 09/02/15 1135 09/02/15 1450  NA 140 141 141 138 138  --   K 3.8 3.6 3.1* 2.6* 2.7* 3.0*  CL 114* 110 107 98* 99*  --   CO2 18* 24 26 27 26   --   GLUCOSE 133* 171* 157* 150* 268*  --   BUN 16 15 18 7  <5*  --   CREATININE 0.53 0.48 0.41* 0.52 0.59  --   CALCIUM 7.4* 7.8* 8.1* 8.3* 8.6*  --   MG  --   --   --   --   --  1.6*   Liver Function Tests:  Recent Labs Lab 08/28/15 1026  AST 29  ALT 13*  ALKPHOS 117    BILITOT 1.5*  PROT 6.9  ALBUMIN 4.0   No results for input(s): LIPASE, AMYLASE in the last 168 hours. No results for input(s): AMMONIA in the last 168 hours. CBC:  Recent Labs Lab 08/28/15 1026 08/28/15 1041 08/29/15 0254 08/30/15 0336 08/31/15 0242 09/02/15 0532  WBC 18.5*  --  11.5* 8.3 6.5 8.0  NEUTROABS 16.3*  --   --   --   --   --   HGB 15.7* 17.0* 11.2* 10.6* 10.5* 11.5*  HCT 45.3 50.0* 33.0* 33.0* 31.6* 34.0*  MCV 95.4  --  95.4 98.5 97.2 95.5  PLT 320  --  190 206 227 324   Cardiac Enzymes: No results for input(s): CKTOTAL, CKMB, CKMBINDEX, TROPONINI in the last 168 hours. BNP (last 3 results) No results for input(s): BNP in the last 8760 hours.  ProBNP (last 3 results) No results for input(s): PROBNP in the last 8760 hours.  CBG:  Recent Labs Lab 09/01/15 1713 09/01/15 2121 09/02/15 0814 09/02/15 1215 09/02/15 1604  GLUCAP 182* 161* 128* 234* 155*    Recent Results (from the past 240 hour(s))  Culture, Urine  Status: None   Collection Time: 08/28/15 10:21 AM  Result Value Ref Range Status   Specimen Description URINE, CATHETERIZED  Final   Special Requests ADDED 2315  Final   Culture NO GROWTH 2 DAYS  Final   Report Status 08/30/2015 FINAL  Final  Blood culture (routine x 2)     Status: None   Collection Time: 08/28/15 10:26 AM  Result Value Ref Range Status   Specimen Description BLOOD LEFT FOREARM  Final   Special Requests BOTTLES DRAWN AEROBIC AND ANAEROBIC 5MLS  Final   Culture NO GROWTH 5 DAYS  Final   Report Status 09/02/2015 FINAL  Final  Blood culture (routine x 2)     Status: None   Collection Time: 08/28/15 10:58 AM  Result Value Ref Range Status   Specimen Description BLOOD LEFT FOREARM  Final   Special Requests IN PEDIATRIC BOTTLE 2.5MLS  Final   Culture NO GROWTH 5 DAYS  Final   Report Status 09/02/2015 FINAL  Final  MRSA PCR Screening     Status: None   Collection Time: 08/28/15  4:53 PM  Result Value Ref Range Status    MRSA by PCR NEGATIVE NEGATIVE Final    Comment:        The GeneXpert MRSA Assay (FDA approved for NASAL specimens only), is one component of a comprehensive MRSA colonization surveillance program. It is not intended to diagnose MRSA infection nor to guide or monitor treatment for MRSA infections.   Culture, respiratory (tracheal aspirate)     Status: None   Collection Time: 08/28/15  4:59 PM  Result Value Ref Range Status   Specimen Description TRACHEAL ASPIRATE  Final   Special Requests NONE  Final   Gram Stain   Final    MODERATE WBC PRESENT, PREDOMINANTLY PMN RARE SQUAMOUS EPITHELIAL CELLS PRESENT RARE GRAM NEGATIVE RODS Performed at [facility]    Culture   Final    RARE CANDIDA ALBICANS Performed at [facility]    Report Status 08/30/2015 FINAL  Final     Studies:  Recent x-ray studies have been reviewed in detail by the Attending Physician  Scheduled Meds:  Scheduled Meds: . amLODipine  5 mg Oral Daily  . antiseptic oral rinse  7 mL Mouth Rinse q12n4p  . antiseptic oral rinse  7 mL Mouth Rinse 6 times per day  . chlorhexidine  15 mL Mouth Rinse BID  . chlorhexidine gluconate  15 mL Mouth Rinse BID  . insulin aspart  0-5 Units Subcutaneous QHS  . insulin aspart  0-9 Units Subcutaneous TID WC  . [START ON 09/03/2015] insulin glargine  20 Units Subcutaneous Daily  . levETIRAcetam  500 mg Intravenous Q12H  . magnesium sulfate 1 - 4 g bolus IVPB  3 g Intravenous Once  . pravastatin  40 mg Oral q1800    Time spent on care of this patient: 40 mins   WOODS, Geraldo Docker , MD  [facility] Office  [phone] Pager - [phone]  On-Call/Text Page:      [email or web address]      password [email or web address]  If 7PM-7AM, please contact night-coverage [email or web address] Password [email or web address] 09/02/2015, 6:46 PM   LOS: 5 days   Care during the described time interval was provided by me .  I have reviewed this patient's available data, including medical history, events of  note, physical examination, and all test results as part of my evaluation. I have personally reviewed and interpreted all radiology studies.   Dia Crawford,  MD [phone] Pager

## 2015-09-02 NOTE — Progress Notes (Signed)
[facility] Progress Note Patient Name: Margaret Fischer DOB: 09/01/1940 MRN: [id number]   Date of Service  09/02/2015  HPI/Events of Note  Hypokalemia  eICU Interventions  Potassium replaced     Intervention Category Intermediate Interventions: Electrolyte abnormality - evaluation and management  DETERDING,ELIZABETH 09/02/2015, 6:33 AM

## 2015-09-03 ENCOUNTER — Inpatient Hospital Stay (HOSPITAL_COMMUNITY): Payer: Medicare Other

## 2015-09-03 DIAGNOSIS — F329 Major depressive disorder, single episode, unspecified: Secondary | ICD-10-CM | POA: Diagnosis present

## 2015-09-03 DIAGNOSIS — F32A Depression, unspecified: Secondary | ICD-10-CM | POA: Diagnosis present

## 2015-09-03 DIAGNOSIS — F411 Generalized anxiety disorder: Secondary | ICD-10-CM | POA: Diagnosis present

## 2015-09-03 DIAGNOSIS — I509 Heart failure, unspecified: Secondary | ICD-10-CM

## 2015-09-03 LAB — CBC WITH DIFFERENTIAL/PLATELET
BASOS ABS: 0 10*3/uL (ref 0.0–0.1)
Basophils Relative: 0 %
EOS PCT: 2 %
Eosinophils Absolute: 0.2 10*3/uL (ref 0.0–0.7)
HEMATOCRIT: 37.5 % (ref 36.0–46.0)
Hemoglobin: 12.6 g/dL (ref 12.0–15.0)
LYMPHS ABS: 1.6 10*3/uL (ref 0.7–4.0)
LYMPHS PCT: 20 %
MCH: 32.2 pg (ref 26.0–34.0)
MCHC: 33.6 g/dL (ref 30.0–36.0)
MCV: 95.9 fL (ref 78.0–100.0)
MONO ABS: 0.8 10*3/uL (ref 0.1–1.0)
Monocytes Relative: 10 %
NEUTROS ABS: 5.5 10*3/uL (ref 1.7–7.7)
Neutrophils Relative %: 68 %
PLATELETS: 389 10*3/uL (ref 150–400)
RBC: 3.91 MIL/uL (ref 3.87–5.11)
RDW: 12.6 % (ref 11.5–15.5)
WBC: 8.1 10*3/uL (ref 4.0–10.5)

## 2015-09-03 LAB — GLUCOSE, CAPILLARY
GLUCOSE-CAPILLARY: 135 mg/dL — AB (ref 65–99)
GLUCOSE-CAPILLARY: 320 mg/dL — AB (ref 65–99)
Glucose-Capillary: 185 mg/dL — ABNORMAL HIGH (ref 65–99)
Glucose-Capillary: 234 mg/dL — ABNORMAL HIGH (ref 65–99)
Glucose-Capillary: 236 mg/dL — ABNORMAL HIGH (ref 65–99)

## 2015-09-03 LAB — COMPREHENSIVE METABOLIC PANEL
ALBUMIN: 2.7 g/dL — AB (ref 3.5–5.0)
ALT: 19 U/L (ref 14–54)
ANION GAP: 8 (ref 5–15)
AST: 19 U/L (ref 15–41)
Alkaline Phosphatase: 100 U/L (ref 38–126)
CHLORIDE: 103 mmol/L (ref 101–111)
CO2: 31 mmol/L (ref 22–32)
Calcium: 8.2 mg/dL — ABNORMAL LOW (ref 8.9–10.3)
Creatinine, Ser: 0.37 mg/dL — ABNORMAL LOW (ref 0.44–1.00)
GFR calc Af Amer: >60 mL/min (ref >60)
GFR calc non Af Amer: >60 mL/min (ref >60)
GLUCOSE: 216 mg/dL — AB (ref 65–99)
POTASSIUM: 2.9 mmol/L — AB (ref 3.5–5.1)
SODIUM: 142 mmol/L (ref 135–145)
TOTAL PROTEIN: 5.3 g/dL — AB (ref 6.5–8.1)
Total Bilirubin: 1.5 mg/dL — ABNORMAL HIGH (ref 0.3–1.2)

## 2015-09-03 LAB — MAGNESIUM: Magnesium: 2 mg/dL (ref 1.7–2.4)

## 2015-09-03 MED ORDER — POTASSIUM CHLORIDE CRYS ER 20 MEQ PO TBCR
60.0000 meq | EXTENDED_RELEASE_TABLET | ORAL | Status: AC
Start: 2015-09-03 — End: 2015-09-03
  Administered 2015-09-03 (×2): 60 meq via ORAL
  Filled 2015-09-03 (×2): qty 3

## 2015-09-03 MED ORDER — INSULIN ASPART 100 UNIT/ML ~~LOC~~ SOLN
0.0000 [IU] | SUBCUTANEOUS | Status: DC
  Administered 2015-09-03: 5 [IU] via SUBCUTANEOUS
  Administered 2015-09-03: 2 [IU] via SUBCUTANEOUS
  Administered 2015-09-04: 3 [IU] via SUBCUTANEOUS
  Administered 2015-09-04: 2 [IU] via SUBCUTANEOUS
  Administered 2015-09-04 (×2): 5 [IU] via SUBCUTANEOUS

## 2015-09-03 NOTE — Evaluation (Addendum)
Physical Therapy Evaluation Patient Details Name: Margaret Fischer MRN: [id number] DOB: December 13, 1940 Today's Date: 09/03/2015   History of Present Illness  Patient is a 75 yo female admitted 08/28/15 following a fall.  Patient with UTI, lactic acidosis, fever, hyperglycemia, acute encephalopathy, SDH, facial fractures, hypokalemia.   PMH:  CVA, anxiety, depression, HTN, bradycardia, DM, neuropathy, chest pain, chronic pain  Clinical Impression  Patient presents with problems listed below.  Will benefit from acute PT to maximize functional mobility prior to discharge.  Patient with unsteady gait/fall risk, and decreased cognition.  Recommend SNF at discharge for continued therapy with goal of Mod I level to return home safely.    Follow Up Recommendations SNF;Supervision/Assistance - 24 hour    Equipment Recommendations  None recommended by PT    Recommendations for Other Services       Precautions / Restrictions Precautions Precautions: Fall Restrictions Weight Bearing Restrictions: No      Mobility  Bed Mobility Overal bed mobility: Needs Assistance Bed Mobility: Supine to Sit;Sit to Supine     Supine to sit: Min guard Sit to supine: Min guard   General bed mobility comments: Assist for safety.  Transfers Overall transfer level: Needs assistance Equipment used: 1 person hand held assist Transfers: Sit to/from [facility] Sit to Stand: Min assist Stand pivot transfers: Min assist       General transfer comment: Assist to steady.  Ambulation/Gait Ambulation/Gait assistance: Min assist;Mod assist Ambulation Distance (Feet): 40 Feet Assistive device: 1 person hand held assist Gait Pattern/deviations: Step-through pattern;Decreased step length - right;Decreased step length - left;Decreased stride length;Shuffle;Staggering left;Staggering right Gait velocity: decreased Gait velocity interpretation: Below normal speed for age/gender General Gait Details:  Patient with slow, unsteady gait, staggering to both sides.  Assist to prevent falls.  Fatigues quickly.  Stairs            Wheelchair Mobility    Modified Rankin (Stroke Patients Only) Modified Rankin (Stroke Patients Only) Pre-Morbid Rankin Score: No significant disability Modified Rankin: Moderately severe disability     Balance Overall balance assessment: Needs assistance         Standing balance support: Single extremity supported Standing balance-Leahy Scale: Poor                               Pertinent Vitals/Pain Pain Assessment: Faces Faces Pain Scale: Hurts whole lot Pain Location: Lt hip Pain Descriptors / Indicators: Aching;Sore Pain Intervention(s): Monitored during session;Repositioned    Home Living Family/patient expects to be discharged to:: Private residence Living Arrangements: Children;Non-relatives/Friends (Daughter and daughter's friend) Available Help at Discharge: Family;Available PRN/intermittently (Unclear if 24 hour assist available) Type of Home: House Home Access: Stairs to enter Entrance Stairs-Rails: None Entrance Stairs-Number of Steps: 3 Home Layout: One level Home Equipment: Walker - 2 wheels;Cane - single point;Bedside commode      Prior Function Level of Independence: Independent               Hand Dominance        Extremity/Trunk Assessment   Upper Extremity Assessment: Generalized weakness           Lower Extremity Assessment: Generalized weakness         Communication   Communication: Expressive difficulties (Slow to respond, soft voice, difficult to understand)  Cognition Arousal/Alertness: Awake/alert Behavior During Therapy: Flat affect Overall Cognitive Status: No family/caregiver present to determine baseline cognitive functioning (Slow to respond to questions)  General Comments General comments (skin integrity, edema, etc.): Noted bruising on back,  buttocks, hips    Exercises        Assessment/Plan    PT Assessment Patient needs continued PT services  PT Diagnosis Difficulty walking;Abnormality of gait;Generalized weakness;Acute pain;Altered mental status   PT Problem List Decreased strength;Decreased activity tolerance;Decreased balance;Decreased mobility;Decreased cognition;Pain  PT Treatment Interventions DME instruction;Gait training;Functional mobility training;Therapeutic activities;Balance training;Cognitive remediation;Patient/family education   PT Goals (Current goals can be found in the Care Plan section) Acute Rehab PT Goals Patient Stated Goal: None stated PT Goal Formulation: With patient Time For Goal Achievement: 09/10/15 Potential to Achieve Goals: Good    Frequency Min 3X/week   Barriers to discharge Decreased caregiver support Unsure if 24 hour assist available.    Co-evaluation               End of Session Equipment Utilized During Treatment: Gait belt Activity Tolerance: Patient limited by fatigue;Patient limited by pain Patient left: in bed;with call bell/phone within reach Nurse Communication: Mobility status         Time: [id number] PT Time Calculation (min) (ACUTE ONLY): 15 min   Charges:   PT Evaluation $PT Eval High Complexity: 1 Procedure     PT G CodesDespina Pole 09/07/2015, 6:02 PM Carita Pian. Sanjuana Kava, [place] Pager [phone]

## 2015-09-03 NOTE — Progress Notes (Signed)
  Echocardiogram 2D Echocardiogram has been performed.  Margaret Fischer 09/03/2015, 8:25 AM

## 2015-09-03 NOTE — Progress Notes (Signed)
[place] TEAM 1 - Stepdown/ICU TEAM Progress Note  Margaret Fischer [id number] DOB: December 13, 1940 DOA: 08/28/2015 PCP: Delia Chimes, NP  Admit HPI / Brief Narrative: 75 y.o.WF  PMHx CVA, Depression, Anxiety, Fibromyalgia HTN, Bradycardia, Chronic Chest Pain HLD, DM type II with Neuropathy, Migraines, Chronic Pain Syndrome, Basal Cell Carcinoma Nose, Drug Abuse (marijuana),  Presents to the ED by EMS w/ complaints of confusion. Per chart review and sn, patient has been somewhat confused over the past few days (lives with sister/daughhter) and was apparently naked and further confused this AM. She was found to have stool and vomit in her bed today. Son states that the patient thought she had a UTI a few days ago as this has been an issue for herr in the past. She also reportedly fell, hitting her left face and head on her fall. EMS was called, taken to the ED. Of note, patient was noted to have CBG's in the 400's, lactic acidosis of 4.5, fever, tachycardia, and CT head showing left subdural hematoma.   HPI/Subjective: 1/14 A/O 4, C/O excruciating left hip pain. Patient also complained of dizziness whenever she moves in bed or sits up. Described as room spinning/disequilibrium   Assessment/Plan: Acute Respiratory Failure: -Resolved  Left Subdural Hematoma:  -Neurosurgery following. Minimal midline shift.  -1/13 CT Head without contrast. Patient's SDH improving but still some minimal shift see results below -Neurosurgery signed off today Dr.Benjamin Jared Ditty neurosurgery. patient follow up with him in 3 weeks with a non-contrast head CT -PT/OT consult pending  Acute Encephalopathy:  -Multifactorial to include SDH, Drug use (UDS positive for THC).  -Resolved -Patient having some vertigo/disequilibrium today counseled patient should be expected with SDH.   Sepsis:  -Don't believe patient had true infection will DC all antibiotics .  Drug abuse (UDS positive marijuana)  Left Orbital  Fracture:  -Per [facility] note Discussed Dr. Benjamine Mola. No acute intervention needed.  -Plan for outpatient follow-up with ENT.  Left hip pain -Patient was noted to bruise from mid thigh to hip lateral aspect, painful to palpation upper femur/trochanteric bursa will obtain hip x-ray.  Diabetes Mellitus Type II:  -Hemoglobin A1c pending -Lipid panel pending -Holding outpatient Levemir & Glucophage.  -Increase Lantus 20 units daily  -Increase to moderate SSI  HTN:  -Allow permissive HTN goal of SBP > 140 -160  -Norvasc 5 mg daily  -Hydralazine IV prn   Depression & Anxiety:  -By [facility] does not appear patient was using Prozac -Continue to Hold outpatient Neurontin, Elavil,  -Restart Clonazepam 2 mg BID PRN anxiety  HLD -Pravachol 40 mg by mouth daily  Hypokalemia -K Dur 60 mEq 2 doses  Hypomagnesemia -Magnesium IV 3 gm    Code Status: FULL Family Communication: no family present at time of exam Disposition Plan: SNF vs CIR    Consultants: Dr. Benjamine Mola ENT Dr.Benjamin Kevan Ny Ditty neurosurgery   Procedure/Significant Events: CT HEAD 1/8: Left subdural hematoma w/ local mass effect & 65mm left to right midline shift.  CT HEAD 1/9: Left subdural hematoma w/ 1mm left to right midline shift & trace subarachnoid hemorharrage on the left.  CT MAXILLOFACIAL 1/9: Depressed, comminuted Lt orbital fx with hemorrhage in Lt maxillary sinus Port CXR 1/11: Personally reviewed by me. Blunting of left costophrenic angle worsening from possible effusion. Intubated. Slight improvement in right basilar hazy opacity. Right hilar opacity persists. No appreciated fractures. 1/13 CT head without contrast; Slightly decreased size of left subdural hematoma, now measuring1 cm in maximal thickness. -4-5 mm  of left-to-right shift. No hydrocephalus or evidence of ventricular trapping. 1/14 echocardiogram;- Left ventricle: mild LVH. LVEF=55% to 60%. -(grade 1 diastolic dysfunction). - Left atrium: mildly  dilated .-Inferior vena cava: Marland Kitchen The respirophasic diameter changes were blunted (< 50%), consistent with elevated CVP     Culture 1/8 Blood left forearm 2 negative final 1/8 Urine Ctx >> Negative final 1/8 MRSA by PCR negative  1/8 Trach Asp Ctx positive rare Candida albicans 1/8 influenza A/B/H1N1 Negative   Antibiotics: Zosyn 1/8 >> 1/13 Vancomycin >> 1/08    DVT prophylaxis: SCD   Devices    LINES / TUBES:  PIV x2 OETT 1/8 - 1/11    Continuous Infusions: . dextrose 10 mL/hr at 09/01/15 0100    Objective: VITAL SIGNS: Temp: 97.4 F (36.3 C) (01/14 1540) Temp Source: Oral (01/14 1540) BP: 160/66 mmHg (01/14 1600) Pulse Rate: 78 (01/14 1600) SPO2; FIO2:   Intake/Output Summary (Last 24 hours) at 09/03/15 1733 Last data filed at 09/03/15 1710  Gross per 24 hour  Intake   1485 ml  Output   5301 ml  Net  -3816 ml     Exam: General:A/O 4, No acute respiratory distress Eyes: Positive headache, eye pain, negative double vision,negative scleral hemorrhage ENT: Negative Runny nose, negative gingival bleeding, Neck:  Negative scars, masses, torticollis, lymphadenopathy, positive JVD Lungs: Clear to auscultation bilaterally without wheezes or crackles Cardiovascular: Regular rate and rhythm without murmur gallop or rub normal S1 and S2 Abdomen:negative abdominal pain, nondistended, positive soft, bowel sounds, no rebound, no ascites, no appreciable mass Extremities: large bruise from waist to mid thigh lateral aspect left thigh pain to palpation upper femur/trochanteric bursa, positive left thigh hematoma.  Psychiatric:  Negative depression, negative anxiety, negative fatigue, negative mania, flat affect  Neurologic:  Cranial nerves II through XII intact, tongue/uvula midline, all extremities muscle strength 5/5, sensation intact throughout,  negative dysarthria, negative expressive aphasia, negative receptive aphasia.   Data Reviewed: Basic Metabolic  Panel:  Recent Labs Lab 08/30/15 0336 08/31/15 0242 09/02/15 0532 09/02/15 1135 09/02/15 1450 09/03/15 0338  NA 141 141 138 138  --  142  K 3.6 3.1* 2.6* 2.7* 3.0* 2.9*  CL 110 107 98* 99*  --  103  CO2 24 26 27 26   --  31  GLUCOSE 171* 157* 150* 268*  --  216*  BUN 15 18 7  <5*  --  <5*  CREATININE 0.48 0.41* 0.52 0.59  --  0.37*  CALCIUM 7.8* 8.1* 8.3* 8.6*  --  8.2*  MG  --   --   --   --  1.6* 2.0   Liver Function Tests:  Recent Labs Lab 08/28/15 1026 09/03/15 0338  AST 29 19  ALT 13* 19  ALKPHOS 117 100  BILITOT 1.5* 1.5*  PROT 6.9 5.3*  ALBUMIN 4.0 2.7*   No results for input(s): LIPASE, AMYLASE in the last 168 hours. No results for input(s): AMMONIA in the last 168 hours. CBC:  Recent Labs Lab 08/28/15 1026  08/29/15 0254 08/30/15 0336 08/31/15 0242 09/02/15 0532 09/03/15 0338  WBC 18.5*  --  11.5* 8.3 6.5 8.0 8.1  NEUTROABS 16.3*  --   --   --   --   --  5.5  HGB 15.7*  < > 11.2* 10.6* 10.5* 11.5* 12.6  HCT 45.3  < > 33.0* 33.0* 31.6* 34.0* 37.5  MCV 95.4  --  95.4 98.5 97.2 95.5 95.9  PLT 320  --  190 206 227 324  389  < > = values in this interval not displayed. Cardiac Enzymes: No results for input(s): CKTOTAL, CKMB, CKMBINDEX, TROPONINI in the last 168 hours. BNP (last 3 results) No results for input(s): BNP in the last 8760 hours.  ProBNP (last 3 results) No results for input(s): PROBNP in the last 8760 hours.  CBG:  Recent Labs Lab 09/02/15 2145 09/03/15 0618 09/03/15 0751 09/03/15 1114 09/03/15 1703  GLUCAP 258* 185* 135* 320* 234*    Recent Results (from the past 240 hour(s))  Culture, Urine     Status: None   Collection Time: 08/28/15 10:21 AM  Result Value Ref Range Status   Specimen Description URINE, CATHETERIZED  Final   Special Requests ADDED 2315  Final   Culture NO GROWTH 2 DAYS  Final   Report Status 08/30/2015 FINAL  Final  Blood culture (routine x 2)     Status: None   Collection Time: 08/28/15 10:26 AM  Result  Value Ref Range Status   Specimen Description BLOOD LEFT FOREARM  Final   Special Requests BOTTLES DRAWN AEROBIC AND ANAEROBIC 5MLS  Final   Culture NO GROWTH 5 DAYS  Final   Report Status 09/02/2015 FINAL  Final  Blood culture (routine x 2)     Status: None   Collection Time: 08/28/15 10:58 AM  Result Value Ref Range Status   Specimen Description BLOOD LEFT FOREARM  Final   Special Requests IN PEDIATRIC BOTTLE 2.5MLS  Final   Culture NO GROWTH 5 DAYS  Final   Report Status 09/02/2015 FINAL  Final  MRSA PCR Screening     Status: None   Collection Time: 08/28/15  4:53 PM  Result Value Ref Range Status   MRSA by PCR NEGATIVE NEGATIVE Final    Comment:        The GeneXpert MRSA Assay (FDA approved for NASAL specimens only), is one component of a comprehensive MRSA colonization surveillance program. It is not intended to diagnose MRSA infection nor to guide or monitor treatment for MRSA infections.   Culture, respiratory (tracheal aspirate)     Status: None   Collection Time: 08/28/15  4:59 PM  Result Value Ref Range Status   Specimen Description TRACHEAL ASPIRATE  Final   Special Requests NONE  Final   Gram Stain   Final    MODERATE WBC PRESENT, PREDOMINANTLY PMN RARE SQUAMOUS EPITHELIAL CELLS PRESENT RARE GRAM NEGATIVE RODS Performed at [facility]    Culture   Final    RARE CANDIDA ALBICANS Performed at [facility]    Report Status 08/30/2015 FINAL  Final     Studies:  Recent x-ray studies have been reviewed in detail by the Attending Physician  Scheduled Meds:  Scheduled Meds: . amLODipine  5 mg Oral Daily  . antiseptic oral rinse  7 mL Mouth Rinse q12n4p  . antiseptic oral rinse  7 mL Mouth Rinse 6 times per day  . chlorhexidine  15 mL Mouth Rinse BID  . chlorhexidine gluconate  15 mL Mouth Rinse BID  . insulin aspart  0-15 Units Subcutaneous 6 times per day  . insulin aspart  0-5 Units Subcutaneous QHS  . insulin glargine  20 Units  Subcutaneous Daily  . levETIRAcetam  500 mg Intravenous Q12H  . pravastatin  40 mg Oral q1800    Time spent on care of this patient: 40 mins   Jarrod Mcenery, Geraldo Docker , MD  [facility] Office  [phone] Pager [phone]  On-Call/Text Page:  [email or web address]      password [email or web address]  If 7PM-7AM, please contact night-coverage [email or web address] Password [email or web address] 09/03/2015, 5:33 PM   LOS: 6 days   Care during the described time interval was provided by me .  I have reviewed this patient's available data, including medical history, events of note, physical examination, and all test results as part of my evaluation. I have personally reviewed and interpreted all radiology studies.   Dia Crawford, MD [phone] Pager

## 2015-09-04 DIAGNOSIS — S0232XK Fracture of orbital floor, left side, subsequent encounter for fracture with nonunion: Secondary | ICD-10-CM

## 2015-09-04 DIAGNOSIS — F1210 Cannabis abuse, uncomplicated: Secondary | ICD-10-CM | POA: Diagnosis present

## 2015-09-04 DIAGNOSIS — I5032 Chronic diastolic (congestive) heart failure: Secondary | ICD-10-CM | POA: Diagnosis present

## 2015-09-04 DIAGNOSIS — E0800 Diabetes mellitus due to underlying condition with hyperosmolarity without nonketotic hyperglycemic-hyperosmolar coma (NKHHC): Secondary | ICD-10-CM | POA: Diagnosis present

## 2015-09-04 DIAGNOSIS — M25552 Pain in left hip: Secondary | ICD-10-CM

## 2015-09-04 LAB — CBC WITH DIFFERENTIAL/PLATELET
Basophils Absolute: 0.1 10*3/uL (ref 0.0–0.1)
Basophils Relative: 1 %
Eosinophils Absolute: 0.3 10*3/uL (ref 0.0–0.7)
Eosinophils Relative: 3 %
HEMATOCRIT: 39.2 % (ref 36.0–46.0)
HEMOGLOBIN: 13.2 g/dL (ref 12.0–15.0)
LYMPHS ABS: 1.7 10*3/uL (ref 0.7–4.0)
LYMPHS PCT: 20 %
MCH: 32 pg (ref 26.0–34.0)
MCHC: 33.7 g/dL (ref 30.0–36.0)
MCV: 95.1 fL (ref 78.0–100.0)
MONOS PCT: 11 %
Monocytes Absolute: 0.9 10*3/uL (ref 0.1–1.0)
NEUTROS ABS: 5.5 10*3/uL (ref 1.7–7.7)
Neutrophils Relative %: 65 %
Platelets: 397 10*3/uL (ref 150–400)
RBC: 4.12 MIL/uL (ref 3.87–5.11)
RDW: 12.6 % (ref 11.5–15.5)
WBC: 8.5 10*3/uL (ref 4.0–10.5)

## 2015-09-04 LAB — COMPREHENSIVE METABOLIC PANEL
ALBUMIN: 2.9 g/dL — AB (ref 3.5–5.0)
ALK PHOS: 97 U/L (ref 38–126)
ALT: 16 U/L (ref 14–54)
AST: 17 U/L (ref 15–41)
Anion gap: 10 (ref 5–15)
BUN: <5 mg/dL — ABNORMAL LOW (ref 6–20)
CALCIUM: 8.6 mg/dL — AB (ref 8.9–10.3)
CO2: 29 mmol/L (ref 22–32)
CREATININE: 0.38 mg/dL — AB (ref 0.44–1.00)
Chloride: 101 mmol/L (ref 101–111)
GFR calc non Af Amer: >60 mL/min (ref >60)
GLUCOSE: 220 mg/dL — AB (ref 65–99)
Potassium: 3.2 mmol/L — ABNORMAL LOW (ref 3.5–5.1)
SODIUM: 140 mmol/L (ref 135–145)
Total Bilirubin: 1.7 mg/dL — ABNORMAL HIGH (ref 0.3–1.2)
Total Protein: 5.7 g/dL — ABNORMAL LOW (ref 6.5–8.1)

## 2015-09-04 LAB — GLUCOSE, CAPILLARY
GLUCOSE-CAPILLARY: 142 mg/dL — AB (ref 65–99)
GLUCOSE-CAPILLARY: 177 mg/dL — AB (ref 65–99)
GLUCOSE-CAPILLARY: 209 mg/dL — AB (ref 65–99)
Glucose-Capillary: 201 mg/dL — ABNORMAL HIGH (ref 65–99)
Glucose-Capillary: 244 mg/dL — ABNORMAL HIGH (ref 65–99)
Glucose-Capillary: 279 mg/dL — ABNORMAL HIGH (ref 65–99)

## 2015-09-04 LAB — LIPID PANEL
CHOL/HDL RATIO: 3.7 ratio
Cholesterol: 151 mg/dL (ref 0–200)
HDL: 41 mg/dL (ref >40)
LDL Cholesterol: 92 mg/dL (ref 0–99)
Triglycerides: 90 mg/dL (ref <150)
VLDL: 18 mg/dL (ref 0–40)

## 2015-09-04 LAB — MAGNESIUM: MAGNESIUM: 1.7 mg/dL (ref 1.7–2.4)

## 2015-09-04 MED ORDER — INSULIN ASPART 100 UNIT/ML ~~LOC~~ SOLN
0.0000 [IU] | SUBCUTANEOUS | Status: DC
  Administered 2015-09-04: 7 [IU] via SUBCUTANEOUS
  Administered 2015-09-04: 11 [IU] via SUBCUTANEOUS
  Administered 2015-09-05: 4 [IU] via SUBCUTANEOUS
  Administered 2015-09-05 (×2): 7 [IU] via SUBCUTANEOUS
  Administered 2015-09-05: 11 [IU] via SUBCUTANEOUS
  Administered 2015-09-05: 7 [IU] via SUBCUTANEOUS
  Administered 2015-09-06: 4 [IU] via SUBCUTANEOUS
  Administered 2015-09-06: 11 [IU] via SUBCUTANEOUS
  Administered 2015-09-06: 7 [IU] via SUBCUTANEOUS
  Administered 2015-09-06 (×2): 20 [IU] via SUBCUTANEOUS
  Administered 2015-09-07 (×2): 7 [IU] via SUBCUTANEOUS

## 2015-09-04 MED ORDER — INSULIN GLARGINE 100 UNIT/ML ~~LOC~~ SOLN
25.0000 [IU] | Freq: Every day | SUBCUTANEOUS | Status: DC
  Filled 2015-09-04: qty 0.25

## 2015-09-04 MED ORDER — POTASSIUM CHLORIDE CRYS ER 20 MEQ PO TBCR
40.0000 meq | EXTENDED_RELEASE_TABLET | Freq: Every day | ORAL | Status: DC
  Administered 2015-09-04: 40 meq via ORAL
  Filled 2015-09-04: qty 2

## 2015-09-04 MED ORDER — FLUOXETINE HCL 20 MG PO CAPS
20.0000 mg | ORAL_CAPSULE | Freq: Every day | ORAL | Status: DC
  Administered 2015-09-04: 20 mg via ORAL
  Filled 2015-09-04: qty 1

## 2015-09-04 MED ORDER — FLUCONAZOLE 100 MG PO TABS
100.0000 mg | ORAL_TABLET | Freq: Every day | ORAL | Status: DC
  Administered 2015-09-04 – 2015-09-07 (×4): 100 mg via ORAL
  Filled 2015-09-04 (×4): qty 1

## 2015-09-04 MED ORDER — MAGNESIUM SULFATE 2 GM/50ML IV SOLN
2.0000 g | Freq: Once | INTRAVENOUS | Status: AC
  Administered 2015-09-04: 2 g via INTRAVENOUS
  Filled 2015-09-04: qty 50

## 2015-09-04 MED ORDER — PRAVASTATIN SODIUM 20 MG PO TABS
60.0000 mg | ORAL_TABLET | Freq: Every day | ORAL | Status: DC
  Administered 2015-09-04 – 2015-09-06 (×3): 60 mg via ORAL
  Filled 2015-09-04 (×3): qty 1

## 2015-09-04 NOTE — Progress Notes (Signed)
Called report to nurse on 5 central, will transfer pt in wheelchair. Consuelo Pandy RN

## 2015-09-04 NOTE — Progress Notes (Signed)
Both of pt's PIV's pulled  and leaking when gown changed before transfer. Mg just hung. I called pharmacy and had Mg reordered for one dose and put in consult with IV team. Notified receiving RN on [facility] and pt transferred. Consuelo Pandy RN

## 2015-09-04 NOTE — Progress Notes (Addendum)
Margaret Fischer - Stepdown/ICU TEAM Progress Note  Margaret Fischer [id number] DOB: [birth year] DOA: Fischer/03/2016 PCP: Delia Chimes, NP  Admit HPI / Brief Narrative: 75 y.o.WF  PMHx CVA, Depression, Anxiety, Fibromyalgia HTN, Bradycardia, Chronic Chest Pain HLD, DM type II with Neuropathy, Migraines, Chronic Pain Syndrome, Basal Cell Carcinoma Nose, Drug Abuse (marijuana),  Presents to the ED by EMS w/ complaints of confusion. Per chart review and sn, patient has been somewhat confused over the past few days (lives with sister/daughhter) and was apparently naked and further confused this AM. She was found to have stool and vomit in her bed today. Son states that the patient thought she had a UTI a few days ago as this has been an issue for herr in the past. She also reportedly fell, hitting her left face and head on her fall. EMS was called, taken to the ED. Of note, patient was noted to have CBG's in the 400's, lactic acidosis of 4.5, fever, tachycardia, and CT head showing left subdural hematoma.   HPI/Subjective: Fischer/15 A/O 4, still complains of left hip pain but states was able to ambulate to bathroom and back to bed. States positive headache, negative dizziness.     Assessment/Plan: Acute Respiratory Failure: -Resolved  Left Subdural Hematoma:  -Neurosurgery following. Minimal midline shift.  -Fischer/13 CT Head without contrast. Patient's SDH improving but still some minimal shift see results below -Neurosurgery signed off today Dr.Benjamin Jared Ditty neurosurgery. patient follow up with him in 3 weeks with a non-contrast head CT -PT; recommends SNF  Acute Encephalopathy:  -Multifactorial to include SDH, Drug use (UDS positive for THC).  -Resolved -Patient having some vertigo/disequilibrium today counseled patient should be expected with SDH. Appears to be waxing and waning.  Sepsis:  -Don't believe patient had true infection will DC all antibiotics .  Oropharyngeal  candidiasis -Fluconazole 100 mg daily 7 days  Drug abuse (UDS positive marijuana) -Counseled patient on sequela of continuing to mix medication with illegal drugs to include death.   Left Orbital Fracture:  -Per [facility] M note Discussed Dr. Benjamine Mola. No acute intervention needed.  -Plan for outpatient follow-up with ENT.  Left hip pain/hematoma -Fischer/14 hip x-ray shows negative fracture/dislocation see results below   Diabetes Mellitus Type II:  -Hemoglobin A1c pending -Holding outpatient Levemir & Glucophage.  -Increase Lantus 25 units daily  -Increase to resistant SSI  HLD -Not within ADA guidelines -Increase Pravachol to 60 mg daily  Diastolic CHF -Allow permissive HTN goal of SBP > 140 -160  -Norvasc 5 mg daily  -Hydralazine IV prn   HTN:  -See diastolic CHF  Depression & Anxiety:  -Restart Prozac 20 mg daily will titrate up slowly. Per [facility] prescription last field April 2015  -Continue to Hold outpatient Neurontin, Elavil,  -Restart Clonazepam 2 mg BID PRN anxiety  Hypokalemia -Potassium goal>4 -K Dur 40 mEq daily  Hypomagnesemia -Magnesium goal> 2 -Magnesium IV 2 gm    Code Status: FULL Family Communication: no family present at time of exam Disposition Plan: SNF vs CIR    Consultants: Dr. Benjamine Mola ENT Dr.Benjamin Kevan Ny Ditty neurosurgery   Procedure/Significant Events: CT HEAD Fischer/8: Left subdural hematoma w/ local mass effect & 55mm left to right midline shift.  CT HEAD Fischer/9: Left subdural hematoma w/ 96mm left to right midline shift & trace subarachnoid hemorharrage on the left.  CT MAXILLOFACIAL Fischer/9: Depressed, comminuted Lt orbital fx with hemorrhage in Lt maxillary sinus Port CXR Fischer/11: Personally reviewed by me. Blunting of left costophrenic  angle worsening from possible effusion. Intubated. Slight improvement in right basilar hazy opacity. Right hilar opacity persists. No appreciated fractures. Fischer/13 CT head without contrast; Slightly decreased size of  left subdural hematoma, now measuring1 cm in maximal thickness. -4-5 mm of left-to-right shift. No hydrocephalus or evidence of ventricular trapping. Fischer/14 echocardiogram;- Left ventricle: mild LVH. LVEF=55% to 60%. -(grade Fischer diastolic dysfunction). - Left atrium: mildly dilated .-Inferior vena cava: Marland Kitchen The respirophasic diameter changes were blunted (< 50%), consistent with elevated CVP  Fischer/14 left hip x-ray;-Moderate symmetric narrowing of both hip joints. Negative hip fracture/dislocation. Fischer/14 echocardiogram Left ventricle: mild LVH. -LVEF= 55% to 60%.-(grade Fischer diastolic dysfunction). - Left atrium: mildly dilated -- Inferior vena cava: The vessel was dilated c/w  Elevated CVP   Culture Fischer/8 Blood left forearm 2 negative final Fischer/8 Urine Ctx >> Negative final Fischer/8 MRSA by PCR negative  Fischer/8 Trach Asp Ctx positive rare Candida albicans Fischer/8 influenza A/B/H1N1 Negative   Antibiotics: Zosyn Fischer/8 >> Fischer/13 Vancomycin >> Fischer/08    DVT prophylaxis: SCD   Devices    LINES / TUBES:  PIV x2 OETT Fischer/8 - Fischer/11    Continuous Infusions: . dextrose 10 mL/hr at 09/01/15 0100    Objective: VITAL SIGNS: Temp: 98.4 F (36.9 C) (01/15 1606) Temp Source: Oral (01/15 1606) BP: 133/49 mmHg (01/15 1420) Pulse Rate: 81 (01/15 0755) SPO2; FIO2:   Intake/Output Summary (Last 24 hours) at 09/04/15 1629 Last data filed at 09/04/15 1500  Gross per 24 hour  Intake   1590 ml  Output   2400 ml  Net   -810 ml     Exam: General:A/O 4, No acute respiratory distress Eyes: Positive headache, eye pain, negative double vision,negative scleral hemorrhage ENT: Negative Runny nose, negative gingival bleeding, white plaques along left lateral aspect of tongue and tonsillar pillar and uvula consistent with candidiasis Neck:  Negative scars, masses, torticollis, lymphadenopathy, positive JVD Lungs: Clear to auscultation bilaterally without wheezes or crackles Cardiovascular: Regular rate and rhythm  without murmur gallop or rub normal S1 and S2 Abdomen:negative abdominal pain, nondistended, positive soft, bowel sounds, no rebound, no ascites, no appreciable mass Extremities: large bruise from waist to mid thigh lateral aspect left thigh pain to palpation upper femur/trochanteric bursa, positive left thigh hematoma.  Psychiatric:  Negative depression, negative anxiety, negative fatigue, negative mania, flat affect  Neurologic:  Cranial nerves II through XII intact, tongue/uvula midline, all extremities muscle strength 5/5, sensation intact throughout,  negative dysarthria, negative expressive aphasia, negative receptive aphasia.   Data Reviewed: Basic Metabolic Panel:  Recent Labs Lab 08/31/15 0242 09/02/15 0532 09/02/15 1135 09/02/15 1450 09/03/15 0338 09/04/15 0432  NA 141 138 138  --  142 140  K 3.Fischer* 2.6* 2.7* 3.0* 2.9* 3.2*  CL 107 98* 99*  --  103 101  CO2 26 27 26   --  31 29  GLUCOSE 157* 150* 268*  --  216* 220*  BUN 18 7 <5*  --  <5* <5*  CREATININE 0.41* 0.52 0.59  --  0.37* 0.38*  CALCIUM 8.Fischer* 8.3* 8.6*  --  8.2* 8.6*  MG  --   --   --  Fischer.6* 2.0 Fischer.7   Liver Function Tests:  Recent Labs Lab 09/03/15 0338 09/04/15 0432  AST 19 17  ALT 19 16  ALKPHOS 100 97  BILITOT Fischer.5* Fischer.7*  PROT 5.3* 5.7*  ALBUMIN 2.7* 2.9*   No results for input(s): LIPASE, AMYLASE in the last 168 hours. No results for input(s):  AMMONIA in the last 168 hours. CBC:  Recent Labs Lab 08/30/15 0336 08/31/15 0242 09/02/15 0532 09/03/15 0338 09/04/15 0432  WBC 8.3 6.5 8.0 8.Fischer 8.5  NEUTROABS  --   --   --  5.5 5.5  HGB 10.6* 10.5* 11.5* 12.6 13.2  HCT 33.0* 31.6* 34.0* 37.5 39.2  MCV 98.5 97.2 95.5 95.9 95.Fischer  PLT 206 227 324 389 397   Cardiac Enzymes: No results for input(s): CKTOTAL, CKMB, CKMBINDEX, TROPONINI in the last 168 hours. BNP (last 3 results) No results for input(s): BNP in the last 8760 hours.  ProBNP (last 3 results) No results for input(s): PROBNP in the last  8760 hours.  CBG:  Recent Labs Lab 09/04/15 0046 09/04/15 0412 09/04/15 0750 09/04/15 1247 09/04/15 1557  GLUCAP 177* 201* 142* 209* 244*    Recent Results (from the past 240 hour(s))  Culture, Urine     Status: None   Collection Time: 08/28/15 10:21 AM  Result Value Ref Range Status   Specimen Description URINE, CATHETERIZED  Final   Special Requests ADDED 2315  Final   Culture NO GROWTH 2 DAYS  Final   Report Status 08/30/2015 FINAL  Final  Blood culture (routine x 2)     Status: None   Collection Time: 08/28/15 10:26 AM  Result Value Ref Range Status   Specimen Description BLOOD LEFT FOREARM  Final   Special Requests BOTTLES DRAWN AEROBIC AND ANAEROBIC 5MLS  Final   Culture NO GROWTH 5 DAYS  Final   Report Status 09/02/2015 FINAL  Final  Blood culture (routine x 2)     Status: None   Collection Time: 08/28/15 10:58 AM  Result Value Ref Range Status   Specimen Description BLOOD LEFT FOREARM  Final   Special Requests IN PEDIATRIC BOTTLE 2.5MLS  Final   Culture NO GROWTH 5 DAYS  Final   Report Status 09/02/2015 FINAL  Final  MRSA PCR Screening     Status: None   Collection Time: 08/28/15  4:53 PM  Result Value Ref Range Status   MRSA by PCR NEGATIVE NEGATIVE Final    Comment:        The GeneXpert MRSA Assay (FDA approved for NASAL specimens only), is one component of a comprehensive MRSA colonization surveillance program. It is not intended to diagnose MRSA infection nor to guide or monitor treatment for MRSA infections.   Culture, respiratory (tracheal aspirate)     Status: None   Collection Time: 08/28/15  4:59 PM  Result Value Ref Range Status   Specimen Description TRACHEAL ASPIRATE  Final   Special Requests NONE  Final   Gram Stain   Final    MODERATE WBC PRESENT, PREDOMINANTLY PMN RARE SQUAMOUS EPITHELIAL CELLS PRESENT RARE GRAM NEGATIVE RODS Performed at [facility]    Culture   Final    RARE CANDIDA ALBICANS Performed at [facility]    Report Status 08/30/2015 FINAL  Final     Studies:  Recent x-ray studies have been reviewed in detail by the Attending Physician  Scheduled Meds:  Scheduled Meds: . amLODipine  5 mg Oral Daily  . antiseptic oral rinse  7 mL Mouth Rinse q12n4p  . FLUoxetine  20 mg Oral Daily  . insulin aspart  0-20 Units Subcutaneous 6 times per day  . insulin aspart  0-5 Units Subcutaneous QHS  . [START ON Fischer/16/2017] insulin glargine  25 Units Subcutaneous Daily  . levETIRAcetam  500 mg Intravenous Q12H  . magnesium  sulfate Fischer - 4 g bolus IVPB  2 g Intravenous Once  . potassium chloride  40 mEq Oral Daily  . pravastatin  60 mg Oral q1800    Time spent on care of this patient: 40 mins   WOODS, Geraldo Docker , MD  [facility] Office  [phone] Pager - [phone]  On-Call/Text Page:      [email or web address]      password [email or web address]  If 7PM-7AM, please contact night-coverage [email or web address] Password [email or web address] Fischer/15/2017, 4:29 PM   LOS: 7 days   Care during the described time interval was provided by me .  I have reviewed this patient's available data, including medical history, events of note, physical examination, and all test results as part of my evaluation. I have personally reviewed and interpreted all radiology studies.   Dia Crawford, MD [phone] Pager

## 2015-09-05 DIAGNOSIS — D75839 Thrombocytosis, unspecified: Secondary | ICD-10-CM | POA: Insufficient documentation

## 2015-09-05 DIAGNOSIS — D473 Essential (hemorrhagic) thrombocythemia: Secondary | ICD-10-CM | POA: Insufficient documentation

## 2015-09-05 DIAGNOSIS — S069X9S Unspecified intracranial injury with loss of consciousness of unspecified duration, sequela: Secondary | ICD-10-CM

## 2015-09-05 DIAGNOSIS — F39 Unspecified mood [affective] disorder: Secondary | ICD-10-CM

## 2015-09-05 DIAGNOSIS — E0800 Diabetes mellitus due to underlying condition with hyperosmolarity without nonketotic hyperglycemic-hyperosmolar coma (NKHHC): Secondary | ICD-10-CM

## 2015-09-05 DIAGNOSIS — F411 Generalized anxiety disorder: Secondary | ICD-10-CM

## 2015-09-05 DIAGNOSIS — R1310 Dysphagia, unspecified: Secondary | ICD-10-CM | POA: Insufficient documentation

## 2015-09-05 DIAGNOSIS — M797 Fibromyalgia: Secondary | ICD-10-CM

## 2015-09-05 DIAGNOSIS — F1210 Cannabis abuse, uncomplicated: Secondary | ICD-10-CM | POA: Insufficient documentation

## 2015-09-05 DIAGNOSIS — F0630 Mood disorder due to known physiological condition, unspecified: Secondary | ICD-10-CM | POA: Insufficient documentation

## 2015-09-05 DIAGNOSIS — F329 Major depressive disorder, single episode, unspecified: Secondary | ICD-10-CM

## 2015-09-05 DIAGNOSIS — S069XAS Unspecified intracranial injury with loss of consciousness status unknown, sequela: Secondary | ICD-10-CM | POA: Insufficient documentation

## 2015-09-05 LAB — CBC WITH DIFFERENTIAL/PLATELET
BASOS ABS: 0 10*3/uL (ref 0.0–0.1)
BASOS PCT: 0 %
EOS ABS: 0.3 10*3/uL (ref 0.0–0.7)
EOS PCT: 3 %
HEMATOCRIT: 39 % (ref 36.0–46.0)
Hemoglobin: 12.9 g/dL (ref 12.0–15.0)
Lymphocytes Relative: 26 %
Lymphs Abs: 2.1 10*3/uL (ref 0.7–4.0)
MCH: 31.5 pg (ref 26.0–34.0)
MCHC: 33.1 g/dL (ref 30.0–36.0)
MCV: 95.4 fL (ref 78.0–100.0)
MONO ABS: 0.7 10*3/uL (ref 0.1–1.0)
MONOS PCT: 8 %
NEUTROS ABS: 5.2 10*3/uL (ref 1.7–7.7)
Neutrophils Relative %: 63 %
PLATELETS: 438 10*3/uL — AB (ref 150–400)
RBC: 4.09 MIL/uL (ref 3.87–5.11)
RDW: 12.5 % (ref 11.5–15.5)
WBC: 8.2 10*3/uL (ref 4.0–10.5)

## 2015-09-05 LAB — COMPREHENSIVE METABOLIC PANEL
ALBUMIN: 3 g/dL — AB (ref 3.5–5.0)
ALK PHOS: 97 U/L (ref 38–126)
ALT: 14 U/L (ref 14–54)
ANION GAP: 8 (ref 5–15)
AST: 18 U/L (ref 15–41)
BILIRUBIN TOTAL: 1.6 mg/dL — AB (ref 0.3–1.2)
BUN: 7 mg/dL (ref 6–20)
CALCIUM: 8.4 mg/dL — AB (ref 8.9–10.3)
CO2: 29 mmol/L (ref 22–32)
CREATININE: 0.47 mg/dL (ref 0.44–1.00)
Chloride: 101 mmol/L (ref 101–111)
GFR calc Af Amer: >60 mL/min (ref >60)
GFR calc non Af Amer: >60 mL/min (ref >60)
GLUCOSE: 295 mg/dL — AB (ref 65–99)
Potassium: 4.2 mmol/L (ref 3.5–5.1)
SODIUM: 138 mmol/L (ref 135–145)
TOTAL PROTEIN: 5.3 g/dL — AB (ref 6.5–8.1)

## 2015-09-05 LAB — GLUCOSE, CAPILLARY
GLUCOSE-CAPILLARY: 157 mg/dL — AB (ref 65–99)
GLUCOSE-CAPILLARY: 223 mg/dL — AB (ref 65–99)
GLUCOSE-CAPILLARY: 293 mg/dL — AB (ref 65–99)
GLUCOSE-CAPILLARY: 43 mg/dL — AB (ref 65–99)
Glucose-Capillary: 154 mg/dL — ABNORMAL HIGH (ref 65–99)
Glucose-Capillary: 239 mg/dL — ABNORMAL HIGH (ref 65–99)
Glucose-Capillary: 282 mg/dL — ABNORMAL HIGH (ref 65–99)
Glucose-Capillary: 70 mg/dL (ref 65–99)

## 2015-09-05 LAB — MAGNESIUM: MAGNESIUM: 2.1 mg/dL (ref 1.7–2.4)

## 2015-09-05 LAB — HEMOGLOBIN A1C
Hgb A1c MFr Bld: 11.1 % — ABNORMAL HIGH (ref 4.8–5.6)
MEAN PLASMA GLUCOSE: 272 mg/dL

## 2015-09-05 MED ORDER — DEXTROSE 50 % IV SOLN
25.0000 mL | Freq: Once | INTRAVENOUS | Status: AC
  Administered 2015-09-05: 25 mL via INTRAVENOUS
  Filled 2015-09-05: qty 50

## 2015-09-05 MED ORDER — CLONAZEPAM 1 MG PO TABS
1.0000 mg | ORAL_TABLET | Freq: Every day | ORAL | Status: DC
  Administered 2015-09-05: 1 mg via ORAL
  Filled 2015-09-05: qty 1

## 2015-09-05 MED ORDER — INSULIN GLARGINE 100 UNIT/ML ~~LOC~~ SOLN
30.0000 [IU] | Freq: Every day | SUBCUTANEOUS | Status: DC
  Filled 2015-09-05: qty 0.3

## 2015-09-05 MED ORDER — INSULIN GLARGINE 100 UNIT/ML ~~LOC~~ SOLN
20.0000 [IU] | Freq: Every day | SUBCUTANEOUS | Status: DC
  Filled 2015-09-05: qty 0.2

## 2015-09-05 MED ORDER — FLUOXETINE HCL 20 MG PO CAPS
40.0000 mg | ORAL_CAPSULE | Freq: Every day | ORAL | Status: DC
  Administered 2015-09-05 – 2015-09-07 (×3): 40 mg via ORAL
  Filled 2015-09-05 (×3): qty 2

## 2015-09-05 MED ORDER — CARVEDILOL 6.25 MG PO TABS
6.2500 mg | ORAL_TABLET | Freq: Two times a day (BID) | ORAL | Status: DC
  Administered 2015-09-05 – 2015-09-07 (×5): 6.25 mg via ORAL
  Filled 2015-09-05 (×5): qty 1

## 2015-09-05 MED ORDER — PANTOPRAZOLE SODIUM 40 MG PO TBEC
40.0000 mg | DELAYED_RELEASE_TABLET | Freq: Every day | ORAL | Status: DC
  Administered 2015-09-05 – 2015-09-07 (×3): 40 mg via ORAL
  Filled 2015-09-05 (×3): qty 1

## 2015-09-05 MED ORDER — INSULIN GLARGINE 100 UNIT/ML ~~LOC~~ SOLN: 35.0000 [IU] | Freq: Every day | SUBCUTANEOUS | Status: DC

## 2015-09-05 MED ORDER — LEVETIRACETAM 500 MG PO TABS
500.0000 mg | ORAL_TABLET | Freq: Two times a day (BID) | ORAL | Status: DC
  Administered 2015-09-05 – 2015-09-07 (×4): 500 mg via ORAL
  Filled 2015-09-05 (×4): qty 1

## 2015-09-05 MED ORDER — NYSTATIN 100000 UNIT/ML MT SUSP
5.0000 mL | Freq: Four times a day (QID) | OROMUCOSAL | Status: DC
  Administered 2015-09-05 – 2015-09-07 (×8): 500000 [IU] via OROMUCOSAL
  Filled 2015-09-05 (×12): qty 5

## 2015-09-05 NOTE — Progress Notes (Signed)
Rehab admissions - Please see consult done by Dr. Posey Pronto today recommending SNF placement.  Patient prefers [facility] for therapies.  Call me for questions.  [phone]

## 2015-09-05 NOTE — Progress Notes (Signed)
Progress Note  Margaret Fischer [id number] DOB: [birth year] DOA: 08/28/2015 PCP: Delia Chimes, NP  Admit HPI / Brief Narrative:  75 y.o.WF  PMHx CVA, Depression, Anxiety, Fibromyalgia HTN, Bradycardia, Chronic Chest Pain HLD, DM type II with Neuropathy, Migraines, Chronic Pain Syndrome, Basal Cell Carcinoma Nose, Drug Abuse (marijuana),  Presents to the ED by EMS w/ complaints of confusion. Per chart review and sn, patient has been somewhat confused over the past few days (lives with sister/daughhter) and was apparently naked and further confused this AM. She was found to have stool and vomit in her bed today. Son states that the patient thought she had a UTI a few days ago as this has been an issue for herr in the past. She also reportedly fell, hitting her left face and head on her fall. EMS was called, taken to the ED. Of note, patient was noted to have CBG's in the 400's, lactic acidosis of 4.5, fever, tachycardia, and CT head showing left subdural hematoma.   HPI/Subjective:  General in bed, denies any headache, no focal weakness, no fever or chills. No chest pain. Some heartburn and throat pain when she swallows.    Assessment/Plan:  Acute Respiratory Failure: -Resolved  Left Subdural Hematoma:  -Neurosurgery following. Minimal midline shift.  -1/13 CT Head without contrast. Patient's SDH improving but still some minimal shift see results below -Neurosurgery signed off today Dr.Benjamin Jared Ditty neurosurgery. patient follow up with him in 3 weeks with a non-contrast head CT -PT; recommends SNF/CIR  Acute Encephalopathy:  -Multifactorial to include SDH, Drug use (UDS positive for THC).  -Resolved   Sepsis:  -Don't believe patient had true infection will DC all antibiotics .  Oropharyngeal candidiasis -Fluconazole 100 mg daily 7 days  Drug abuse (UDS positive marijuana) -Counseled patient on sequela of continuing to mix medication with illegal drugs to include death.    Left Orbital Fracture:  -Per [facility] M note Discussed Dr. Benjamine Mola. No acute intervention needed.  -Plan for outpatient follow-up with ENT.  Left hip pain/hematoma -1/14 hip x-ray shows negative fracture/dislocation see results below, currently pain-free will monitor.   HLD -Not within ADA guidelines -Increase Pravachol to 60 mg daily  Diastolic CHF -Allow permissive HTN goal of SBP > 140 -160  -Norvasc 5 mg daily  -Hydralazine IV prn   HTN:  -Stable on Norvasc and as needed hydralazine  Depression & Anxiety:  -Increase Prozac to 40 mg daily which she takes at home  -Continue to Hold outpatient Neurontin, Elavil,  Start on Klonopin 1 mg daily for anxiety  Hypokalemia -Replaced and stable.  Hypomagnesemia -repalced and stable   Diabetes Mellitus Type II: On Lantus and sliding scale will monitor  Lab Results  Component Value Date   HGBA1C 10.0* 08/31/2013    CBG (last 3)   Recent Labs  09/05/15 0403 09/05/15 0634 09/05/15 0731  GLUCAP 239* 157* 154*       Code Status: FULL Family Communication: no family present at time of exam Disposition Plan: SNF vs CIR    Consultants: Dr. Benjamine Mola ENT Dr.Benjamin Kevan Ny Ditty neurosurgery   Procedure/Significant Events: CT HEAD 1/8: Left subdural hematoma w/ local mass effect & 76mm left to right midline shift.  CT HEAD 1/9: Left subdural hematoma w/ 31mm left to right midline shift & trace subarachnoid hemorharrage on the left.  CT MAXILLOFACIAL 1/9: Depressed, comminuted Lt orbital fx with hemorrhage in Lt maxillary sinus Port CXR 1/11: Personally reviewed by me. Blunting of left costophrenic angle worsening  from possible effusion. Intubated. Slight improvement in right basilar hazy opacity. Right hilar opacity persists. No appreciated fractures. 1/13 CT head without contrast; Slightly decreased size of left subdural hematoma, now measuring1 cm in maximal thickness. -4-5 mm of left-to-right shift. No hydrocephalus  or evidence of ventricular trapping. 1/14 echocardiogram;- Left ventricle: mild LVH. LVEF=55% to 60%. -(grade 1 diastolic dysfunction). - Left atrium: mildly dilated .-Inferior vena cava: Marland Kitchen The respirophasic diameter changes were blunted (< 50%), consistent with elevated CVP  1/14 left hip x-ray;-Moderate symmetric narrowing of both hip joints. Negative hip fracture/dislocation. 1/14 echocardiogram Left ventricle: mild LVH. -LVEF= 55% to 60%.-(grade 1 diastolic dysfunction). - Left atrium: mildly dilated -- Inferior vena cava: The vessel was dilated c/w  Elevated CVP   Culture 1/8 Blood left forearm 2 negative final 1/8 Urine Ctx >> Negative final 1/8 MRSA by PCR negative  1/8 Trach Asp Ctx positive rare Candida albicans 1/8 influenza A/B/H1N1 Negative   DVT prophylaxis: SCD   LINES / TUBES:  PIV x2 OETT 1/8 - 1/11  Anti-infectives    Start     Dose/Rate Route Frequency Ordered Stop   09/04/15 1800  fluconazole (DIFLUCAN) tablet 100 mg     100 mg Oral Daily 09/04/15 1632     08/28/15 2300  vancomycin (VANCOCIN) IVPB 1000 mg/200 mL premix  Status:  Discontinued     1,000 mg 200 mL/hr over 60 Minutes Intravenous Every 12 hours 08/28/15 1113 08/28/15 1702   08/28/15 1900  piperacillin-tazobactam (ZOSYN) IVPB 3.375 g  Status:  Discontinued     3.375 g 12.5 mL/hr over 240 Minutes Intravenous 3 times per day 08/28/15 1415 09/02/15 1821   08/28/15 1400  piperacillin-tazobactam (ZOSYN) IVPB 3.375 g  Status:  Discontinued     3.375 g 12.5 mL/hr over 240 Minutes Intravenous 3 times per day 08/28/15 1113 08/28/15 1415   08/28/15 1045  piperacillin-tazobactam (ZOSYN) IVPB 3.375 g     3.375 g 100 mL/hr over 30 Minutes Intravenous  Once 08/28/15 1031 08/28/15 1145   08/28/15 1045  vancomycin (VANCOCIN) IVPB 1000 mg/200 mL premix     1,000 mg 200 mL/hr over 60 Minutes Intravenous  Once 08/28/15 1031 08/28/15 1230       Continuous Infusions: . dextrose 10 mL/hr at 09/04/15 2132     Objective: VITAL SIGNS: Temp: 98.8 F (37.1 C) (01/16 0615) Temp Source: Oral (01/16 0615) BP: 163/62 mmHg (01/16 0615) Pulse Rate: 70 (01/16 0615) SPO2; FIO2:   Intake/Output Summary (Last 24 hours) at 09/05/15 1025 Last data filed at 09/04/15 2243  Gross per 24 hour  Intake    875 ml  Output    700 ml  Net    175 ml     Exam: General:A/O 4, No acute respiratory distress Eyes: Positive headache, eye pain, negative double vision,negative scleral hemorrhage ENT: Negative Runny nose, negative gingival bleeding, white plaques along left lateral aspect of tongue and tonsillar pillar and uvula consistent with candidiasis Neck:  Negative scars, masses, torticollis, lymphadenopathy, positive JVD Lungs: Clear to auscultation bilaterally without wheezes or crackles Cardiovascular: Regular rate and rhythm without murmur gallop or rub normal S1 and S2 Abdomen:negative abdominal pain, nondistended, positive soft, bowel sounds, no rebound, no ascites, no appreciable mass Extremities: large bruise from waist to mid thigh lateral aspect left thigh pain to palpation upper femur/trochanteric bursa, positive left thigh hematoma.  Psychiatric:  Negative depression, negative anxiety, negative fatigue, negative mania, flat affect  Neurologic:  Cranial nerves II through XII intact, tongue/uvula  midline, all extremities muscle strength 5/5, sensation intact throughout,  negative dysarthria, negative expressive aphasia, negative receptive aphasia.   Data Reviewed: Basic Metabolic Panel:  Recent Labs Lab 09/02/15 0532 09/02/15 1135 09/02/15 1450 09/03/15 0338 09/04/15 0432 09/05/15 0406  NA 138 138  --  142 140 138  K 2.6* 2.7* 3.0* 2.9* 3.2* 4.2  CL 98* 99*  --  103 101 101  CO2 27 26  --  31 29 29   GLUCOSE 150* 268*  --  216* 220* 295*  BUN 7 <5*  --  <5* <5* 7  CREATININE 0.52 0.59  --  0.37* 0.38* 0.47  CALCIUM 8.3* 8.6*  --  8.2* 8.6* 8.4*  MG  --   --  1.6* 2.0 1.7 2.1    Liver Function Tests:  Recent Labs Lab 09/03/15 0338 09/04/15 0432 09/05/15 0406  AST 19 17 18   ALT 19 16 14   ALKPHOS 100 97 97  BILITOT 1.5* 1.7* 1.6*  PROT 5.3* 5.7* 5.3*  ALBUMIN 2.7* 2.9* 3.0*   No results for input(s): LIPASE, AMYLASE in the last 168 hours. No results for input(s): AMMONIA in the last 168 hours. CBC:  Recent Labs Lab 08/31/15 0242 09/02/15 0532 09/03/15 0338 09/04/15 0432 09/05/15 0406  WBC 6.5 8.0 8.1 8.5 8.2  NEUTROABS  --   --  5.5 5.5 5.2  HGB 10.5* 11.5* 12.6 13.2 12.9  HCT 31.6* 34.0* 37.5 39.2 39.0  MCV 97.2 95.5 95.9 95.1 95.4  PLT 227 324 389 397 438*   Cardiac Enzymes: No results for input(s): CKTOTAL, CKMB, CKMBINDEX, TROPONINI in the last 168 hours. BNP (last 3 results) No results for input(s): BNP in the last 8760 hours.  ProBNP (last 3 results) No results for input(s): PROBNP in the last 8760 hours.  CBG:  Recent Labs Lab 09/04/15 2357 09/05/15 0018 09/05/15 0403 09/05/15 0634 09/05/15 0731  GLUCAP 43* 70 239* 157* 154*    Recent Results (from the past 240 hour(s))  Culture, Urine     Status: None   Collection Time: 08/28/15 10:21 AM  Result Value Ref Range Status   Specimen Description URINE, CATHETERIZED  Final   Special Requests ADDED 2315  Final   Culture NO GROWTH 2 DAYS  Final   Report Status 08/30/2015 FINAL  Final  Blood culture (routine x 2)     Status: None   Collection Time: 08/28/15 10:26 AM  Result Value Ref Range Status   Specimen Description BLOOD LEFT FOREARM  Final   Special Requests BOTTLES DRAWN AEROBIC AND ANAEROBIC 5MLS  Final   Culture NO GROWTH 5 DAYS  Final   Report Status 09/02/2015 FINAL  Final  Blood culture (routine x 2)     Status: None   Collection Time: 08/28/15 10:58 AM  Result Value Ref Range Status   Specimen Description BLOOD LEFT FOREARM  Final   Special Requests IN PEDIATRIC BOTTLE 2.5MLS  Final   Culture NO GROWTH 5 DAYS  Final   Report Status 09/02/2015 FINAL  Final   MRSA PCR Screening     Status: None   Collection Time: 08/28/15  4:53 PM  Result Value Ref Range Status   MRSA by PCR NEGATIVE NEGATIVE Final    Comment:        The GeneXpert MRSA Assay (FDA approved for NASAL specimens only), is one component of a comprehensive MRSA colonization surveillance program. It is not intended to diagnose MRSA infection nor to guide or monitor treatment for MRSA infections.  Culture, respiratory (tracheal aspirate)     Status: None   Collection Time: 08/28/15  4:59 PM  Result Value Ref Range Status   Specimen Description TRACHEAL ASPIRATE  Final   Special Requests NONE  Final   Gram Stain   Final    MODERATE WBC PRESENT, PREDOMINANTLY PMN RARE SQUAMOUS EPITHELIAL CELLS PRESENT RARE GRAM NEGATIVE RODS Performed at [facility]    Culture   Final    RARE CANDIDA ALBICANS Performed at [facility]    Report Status 08/30/2015 FINAL  Final     Studies:  Recent x-ray studies have been reviewed in detail by the Attending Physician  Scheduled Meds:  Scheduled Meds: . amLODipine  5 mg Oral Daily  . antiseptic oral rinse  7 mL Mouth Rinse q12n4p  . carvedilol  6.25 mg Oral BID WC  . fluconazole  100 mg Oral Daily  . FLUoxetine  40 mg Oral Daily  . insulin aspart  0-20 Units Subcutaneous 6 times per day  . [START ON 09/06/2015] insulin glargine  20 Units Subcutaneous Daily  . levETIRAcetam  500 mg Intravenous Q12H  . nystatin  5 mL Mouth/Throat QID  . pantoprazole  40 mg Oral Daily  . potassium chloride  40 mEq Oral Daily  . pravastatin  60 mg Oral q1800    Time spent on care of this patient: 30 mins  Signature  Lala Lund K M.D on 09/05/2015 at 10:25 AM  Between 7am to 7pm - Pager - [phone], After 7pm go to [email or web address] - password [email or web address]  [facility]  - Office  [phone]

## 2015-09-05 NOTE — Progress Notes (Addendum)
Hypoglycemic Event  CBG: 43  Treatment: 15 GM carbohydrate snack  Symptoms: None  Follow-up CBG: Time:0018 CBG Result:70  Possible Reasons for Event: Inadequate meal intake  Comments/MD notified:yes, NP T, Blima Rich, Asmaa Tirpak Efe

## 2015-09-05 NOTE — Consult Note (Signed)
Physical Medicine and Rehabilitation Consult Reason for Consult: Traumatic Left subdural hematoma Referring Physician: [facility]   HPI: Margaret Fischer is a 75 y.o. right handed female with history of diabetes mellitus, fibromyalgia and recently treated for UTI. By report patient lives with daughter. One level home 3 steps to entry. Independent with a cane prior to admission . Presented 08/28/2015 after fall with altered mental status. She became more confused and agitated. Blood sugar in the 400s, lactic acidosis 4.5. She did have a low-grade fever. CT in the ED showed left-sided subdural hematoma with minimal midline shift. CT maxillofacial showed depressed comminuted left orbital floor fracture associated with dependent hemorrhage in the left maxillary sinus. She was intubated for agitation later extubated 08/31/2015. Urine drug screen positive for marijuana. Neurosurgery Dr.Ditty consulted advise conservative care with serial follow-up CT scans of the head. Follow-up CT of the head 09/02/2015 slightly decreased size of left subdural hematoma. 45 mm left-to-right shift. No hydrocephalus. Dysphagia #1 thin liquid diet. Maintained on Keppra for seizure prophylaxis. Physical therapy evaluation completed 09/03/2015 with recommendations of physical medicine rehabilitation consult.   Review of Systems  Constitutional: Negative for chills.  HENT: Negative for hearing loss.   Eyes: Negative for blurred vision and double vision.  Respiratory: Negative for cough and shortness of breath.   Cardiovascular: Negative for chest pain and leg swelling.  Gastrointestinal: Positive for nausea and constipation. Negative for vomiting.  Musculoskeletal: Positive for back pain, falls and neck pain.  Neurological: Positive for dizziness, weakness and headaches. Negative for seizures.  Psychiatric/Behavioral: Positive for depression.       Anxiety  All other systems reviewed and are negative.  Past Medical  History  Diagnosis Date  . Diabetes mellitus     Insulin dependent  . Chest pain, non-cardiac     History of 2 normal cardiac catheterizations  . Bradycardia   . Dizziness     Chronic  . Fibromyalgia   . Neuropathy ([place])   . Chronic chest pain   . Hyperlipidemia   . Chronic neck pain   . Chronic back pain   . Basal cell carcinoma of nose   . Depression   . Thyroid cyst   . Goiter   . 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 Hypertension   . Depression   . Anxiety   . Migraine    Past Surgical History  Procedure Laterality Date  . Total knee arthroplasty    . Cardiac catheterization      History of 2 caths, reportedly normal  . Joint replacement    . Rotator cuff repair    . Appendectomy    . Cystectomy    . Thyroid surgery    . Breast lumpectomy    . Abdominal hysterectomy    . Biceps tendon repair    . Refractive surgery    . Right nares  09/2011    basal cell surgery  . Cholecystectomy    . Vesicovaginal fistula closure w/ tah    . Lasik     Family History  Problem Relation Age of Onset  . Lymphoma Mother   . Diabetic kidney disease Mother   . Diabetic kidney disease Brother   . Heart disease Sister   . Cancer Sister   . Heart attack Brother   . Myasthenia gravis Brother    Social History:  reports that she quit smoking about 31 years ago. She has never used smokeless tobacco.  She reports that she does not drink alcohol or use illicit drugs. Allergies:  Allergies  Allergen Reactions  . Epinephrine Other (See Comments)    Tachycardia when used as a local anesthetic  . Hydromorphone Other (See Comments)    Unknown reaction  . Morphine And Related Other (See Comments)    Unknown reaction  . Phenergan [Promethazine Hcl] Other (See Comments)    " GOES WACK YOU WITH THIS MEDICATION" STATEMENT MADE BY PATIENT  . Succinylcholine Other (See Comments)    Unknown reaction  . Latex Rash   Medications Prior to Admission  Medication Sig Dispense Refill  .  clonazePAM (KLONOPIN) 2 MG tablet Take 2 mg by mouth 3 (three) times daily as needed for anxiety.    . gabapentin (NEURONTIN) 600 MG tablet Take 600 mg by mouth 4 (four) times daily.    . insulin detemir (LEVEMIR) 100 UNIT/ML injection Inject 0.3 mLs (30 Units total) into the skin 2 (two) times daily. 10 mL 11  . metFORMIN (GLUCOPHAGE) 1000 MG tablet Take 1,000 mg by mouth 2 (two) times daily with a meal.    . nitroGLYCERIN (NITROSTAT) 0.4 MG SL tablet Place 0.4 mg under the tongue every 5 (five) minutes as needed for chest pain.    Marland Kitchen amitriptyline (ELAVIL) 10 MG tablet TAKE ONE & ONE-HALF TABLETS BY MOUTH ONCE DAILY AT BEDTIME 15 tablet 0  . amLODipine (NORVASC) 10 MG tablet Take 1 tablet (10 mg total) by mouth daily. 30 tablet 0  . aspirin 325 MG tablet Take 325 mg by mouth 2 (two) times daily.     . Cholecalciferol (VITAMIN D-3 PO) Take by mouth daily.      . Cyanocobalamin (B-12 PO) Take 1 tablet by mouth daily.    . diclofenac (VOLTAREN) 75 MG EC tablet Take 75 mg by mouth 2 (two) times daily.    Marland Kitchen FLUoxetine (PROZAC) 40 MG capsule Take 80 mg by mouth daily.     Marland Kitchen glucosamine-chondroitin 500-400 MG tablet Take 1 tablet by mouth 2 (two) times daily.      Marland Kitchen lisinopril (PRINIVIL,ZESTRIL) 20 MG tablet Take 1 tablet (20 mg total) by mouth daily. 30 tablet 0  . potassium chloride (KLOR-CON) 20 MEQ packet Take 20 mEq by mouth 2 (two) times daily.    . pravastatin (PRAVACHOL) 40 MG tablet Take 1 tablet (40 mg total) by mouth daily. 30 tablet 3    Home: Home Living Family/patient expects to be discharged to:: Private residence Living Arrangements: Children, Non-relatives/Friends (Daughter and daughter's friend) Available Help at Discharge: Family, Available PRN/intermittently (Unclear if 24 hour assist available) Type of Home: House Home Access: Stairs to enter Technical brewer of Steps: 3 Entrance Stairs-Rails: None Home Layout: One level Home Equipment: Environmental consultant - 2 wheels, Cane -  single point, Bedside commode  Functional History: Prior Function Level of Independence: Independent Functional Status:  Mobility: Bed Mobility Overal bed mobility: Needs Assistance Bed Mobility: Supine to Sit, Sit to Supine Supine to sit: Min guard Sit to supine: Min guard General bed mobility comments: Assist for safety. Transfers Overall transfer level: Needs assistance Equipment used: 1 person hand held assist Transfers: Sit to/from Stand, Stand Pivot Transfers Sit to Stand: Min assist Stand pivot transfers: Min assist General transfer comment: Assist to steady. Ambulation/Gait Ambulation/Gait assistance: Min assist, Mod assist Ambulation Distance (Feet): 40 Feet Assistive device: 1 person hand held assist Gait Pattern/deviations: Step-through pattern, Decreased step length - right, Decreased step length - left, Decreased stride length, Shuffle,  Staggering left, Staggering right General Gait Details: Patient with slow, unsteady gait, staggering to both sides.  Assist to prevent falls.  Fatigues quickly. Gait velocity: decreased Gait velocity interpretation: Below normal speed for age/gender    ADL:    Cognition: Cognition Overall Cognitive Status: No family/caregiver present to determine baseline cognitive functioning (Slow to respond to questions) Orientation Level: Oriented X4 Cognition Arousal/Alertness: Awake/alert Behavior During Therapy: Flat affect Overall Cognitive Status: No family/caregiver present to determine baseline cognitive functioning (Slow to respond to questions)  Blood pressure 163/62, pulse 73, temperature 98.5 F (36.9 C), temperature source Oral, resp. rate 15, height 5\' 6"  (1.676 m), weight 70.6 kg (155 lb 10.3 oz), SpO2 97 %. Physical Exam  Vitals reviewed. Constitutional: She is oriented to person, place, and time. No distress.  Frail and cachectic  HENT:  Head: Normocephalic.  Ecchymosis left cheekbone  Eyes: Conjunctivae and EOM are  normal.  Neck: Normal range of motion. Neck supple. No thyromegaly present.  Cardiovascular: Normal rate and regular rhythm.   Respiratory: Effort normal and breath sounds normal. No respiratory distress.  GI: Soft. Bowel sounds are normal. She exhibits no distension.  Musculoskeletal: She exhibits edema and tenderness.  Neurological: She is alert and oriented to person, place, and time. She has normal reflexes.  Mood is flat but appropriate.  Follows simple commands.  Sensation intact to light touch Motor: 4/5 throughout  Skin: Skin is warm and dry.  Ecchymosis on face  Psychiatric: Her mood appears anxious. Her affect is blunt. She is withdrawn.    Results for orders placed or performed during the hospital encounter of 08/28/15 (from the past 24 hour(s))  Glucose, capillary     Status: Abnormal   Collection Time: 09/04/15 12:47 PM  Result Value Ref Range   Glucose-Capillary 209 (H) 65 - 99 mg/dL  Glucose, capillary     Status: Abnormal   Collection Time: 09/04/15  3:57 PM  Result Value Ref Range   Glucose-Capillary 244 (H) 65 - 99 mg/dL  Glucose, capillary     Status: Abnormal   Collection Time: 09/04/15  9:16 PM  Result Value Ref Range   Glucose-Capillary 279 (H) 65 - 99 mg/dL   Comment 1 Notify RN    Comment 2 Document in Chart   Glucose, capillary     Status: Abnormal   Collection Time: 09/04/15 11:57 PM  Result Value Ref Range   Glucose-Capillary 43 (LL) 65 - 99 mg/dL   Comment 1 Notify RN    Comment 2 Document in Chart   Glucose, capillary     Status: None   Collection Time: 09/05/15 12:18 AM  Result Value Ref Range   Glucose-Capillary 70 65 - 99 mg/dL   Comment 1 Notify RN    Comment 2 Document in Chart   Glucose, capillary     Status: Abnormal   Collection Time: 09/05/15  4:03 AM  Result Value Ref Range   Glucose-Capillary 239 (H) 65 - 99 mg/dL   Comment 1 Notify RN    Comment 2 Document in Chart   Comprehensive metabolic panel     Status: Abnormal    Collection Time: 09/05/15  4:06 AM  Result Value Ref Range   Sodium 138 135 - 145 mmol/L   Potassium 4.2 3.5 - 5.1 mmol/L   Chloride 101 101 - 111 mmol/L   CO2 29 22 - 32 mmol/L   Glucose, Bld 295 (H) 65 - 99 mg/dL   BUN 7 6 - 20 mg/dL  Creatinine, Ser 0.47 0.44 - 1.00 mg/dL   Calcium 8.4 (L) 8.9 - 10.3 mg/dL   Total Protein 5.3 (L) 6.5 - 8.1 g/dL   Albumin 3.0 (L) 3.5 - 5.0 g/dL   AST 18 15 - 41 U/L   ALT 14 14 - 54 U/L   Alkaline Phosphatase 97 38 - 126 U/L   Total Bilirubin 1.6 (H) 0.3 - 1.2 mg/dL   GFR calc non Af Amer >60 >60 mL/min   GFR calc Af Amer >60 >60 mL/min   Anion gap 8 5 - 15  CBC with Differential/Platelet     Status: Abnormal   Collection Time: 09/05/15  4:06 AM  Result Value Ref Range   WBC 8.2 4.0 - 10.5 K/uL   RBC 4.09 3.87 - 5.11 MIL/uL   Hemoglobin 12.9 12.0 - 15.0 g/dL   HCT 39.0 36.0 - 46.0 %   MCV 95.4 78.0 - 100.0 fL   MCH 31.5 26.0 - 34.0 pg   MCHC 33.1 30.0 - 36.0 g/dL   RDW 12.5 11.5 - 15.5 %   Platelets 438 (H) 150 - 400 K/uL   Neutrophils Relative % 63 %   Neutro Abs 5.2 1.7 - 7.7 K/uL   Lymphocytes Relative 26 %   Lymphs Abs 2.1 0.7 - 4.0 K/uL   Monocytes Relative 8 %   Monocytes Absolute 0.7 0.1 - 1.0 K/uL   Eosinophils Relative 3 %   Eosinophils Absolute 0.3 0.0 - 0.7 K/uL   Basophils Relative 0 %   Basophils Absolute 0.0 0.0 - 0.1 K/uL  Magnesium     Status: None   Collection Time: 09/05/15  4:06 AM  Result Value Ref Range   Magnesium 2.1 1.7 - 2.4 mg/dL  Glucose, capillary     Status: Abnormal   Collection Time: 09/05/15  6:34 AM  Result Value Ref Range   Glucose-Capillary 157 (H) 65 - 99 mg/dL  Glucose, capillary     Status: Abnormal   Collection Time: 09/05/15  7:31 AM  Result Value Ref Range   Glucose-Capillary 154 (H) 65 - 99 mg/dL   Comment 1 Notify RN    Comment 2 Document in Chart    Dg Hip Unilat With Pelvis 2-3 Views Left  09/03/2015  CLINICAL DATA:  Pain following fall 1 week prior EXAM: DG HIP (WITH OR WITHOUT  PELVIS) 2-3V LEFT COMPARISON:  None. FINDINGS: Frontal pelvis as well as frontal and lateral left hip images were obtained. Bones are diffusely osteoporotic. There is no demonstrable fracture or dislocation. There is moderate symmetric narrowing of both hip joints. No erosive change. There are surgical clips in the right inguinal region. IMPRESSION: Moderate symmetric narrowing of both hip joints. Diffuse osteoporosis. No acute fracture or dislocation. Electronically Signed   By: Lowella Grip III M.D.   On: 09/03/2015 19:12    Assessment/Plan: Diagnosis: Traumatic Left subdural hematoma Labs and images independently reviewed.  Records reviewed and summated above.  [place] score:  >/7  Speech to evaluate for Post traumatic amnesia and interval GOAT scores to assess progress.  NeuroPsych evaluation for behavorial assessment.  Provide environmental management by reducing the level of stimulation, tolerating restlessness when possible, protecting patient from harming self or others and reducing patient's cognitive confusion.  Address behavioral concerns include providing structured environments and daily routines.  Cognitive therapy to direct modular abilities in order to maintain goals  including problem solving, self regulation/monitoring, self management, attention, and memory.  Fall precautions; pt at risk  for second impact syndrome  Prevention of secondary injury: monitor for hypotension, hypoxia, seizures or signs of increased ICP  Prophylactic AED  Consider pharmacological intervention if necessary with neurostimulants, such as amantadine, methylphenidate, modafinil, etc.  Consider Propranolol for agitation and storming  Avoid medications that could impair cognitive abilities, such as anticholinergics, antihistaminic, benzodiazapines, narcotics, etc when possible  1. Does the need for close, 24 hr/day medical supervision in concert with the patient's rehab needs make it  unreasonable for this patient to be served in a less intensive setting? Yes 2. Co-Morbidities requiring supervision/potential complications: Dysphagia (cont SLP, consider vital stim), marijuana abuse (cont counseling), diabetes mellitus (Monitor in accordance with exercise and adjust meds as necessary), fibromyalgia (monitor for pain and treat accordingly to improve functional gain), HTN (monitor and provide prns in accordance with increased physical exertion and pain), thrombocytosis (likely reactive) 3. Due to safety, skin/wound care, disease management, medication administration and patient education, does the patient require 24 hr/day rehab nursing? Yes 4. Does the patient require coordinated care of a physician, rehab nurse, PT (1-2 hrs/day, 5 days/week), OT (1-2 hrs/day, 5 days/week) and SLP (1-2 hrs/day, 5 days/week) to address physical and functional deficits in the context of the above medical diagnosis(es)? Yes Addressing deficits in the following areas: balance, endurance, locomotion, strength, transferring, toileting, speech, swallowing and psychosocial support 5. Can the patient actively participate in an intensive therapy program of at least 3 hrs of therapy per day at least 5 days per week? Yes 6. The potential for patient to make measurable gains while on inpatient rehab is good 7. Anticipated functional outcomes upon discharge from inpatient rehab are n/a  with PT, n/a with OT, n/a with SLP. 8. Estimated rehab length of stay to reach the above functional goals is: N/A 9. Does the patient have adequate social supports and living environment to accommodate these discharge functional goals? N/A 10. Anticipated D/C setting: Other 11. Anticipated post D/C treatments: SNF 12. Overall Rehab/Functional Prognosis: good  RECOMMENDATIONS: This patient's condition is appropriate for continued rehabilitative care in the following setting: Pt would like to go to SNF in [place].  Further, pt  unlikely to achieve a modified Ind level of functioning after a short IRF stay.  Agree with PT, would recommend SNF.   Patient has agreed to participate in recommended program. Potentially Note that insurance prior authorization may be required for reimbursement for recommended care.  Comment: Rehab Admissions Coordinator to follow up.  Delice Lesch, MD 09/05/2015

## 2015-09-05 NOTE — Care Management Important Message (Signed)
Important Message  Patient Details  Name: Margaret Fischer MRN: [id number] Date of Birth: Oct 21, 1940   Medicare Important Message Given:  Yes    [name] 09/05/2015, 3:46 PM

## 2015-09-05 NOTE — NC FL2 (Signed)
[place] LEVEL OF CARE SCREENING TOOL     IDENTIFICATION  Patient Name: Margaret Fischer Birthdate: 1941-08-17 Sex: female Admission Date (Current Location): 08/28/2015  [facility] and [place] Number:  Herbalist and Address:  [facility] [place] [address], [place], [place] [address]      Provider Number: [id number]  Attending Physician Name and Address:  Thurnell Lose, MD  Relative Name and Phone Number:       Current Level of Care: Hospital Recommended Level of Care: [place] Prior Approval Number:    Date Approved/Denied:   PASRR Number:    Discharge Plan: SNF    Current Diagnoses: Patient Active Problem List   Diagnosis Date Noted  . Dysphagia   . Marijuana abuse   . Thrombocytosis ([place])   . 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 Drug abuse, marijuana   . 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 Left hip pain   . Diabetes mellitus due to underlying condition with hyperosmolarity without coma, without long-term current use of insulin ([place])   . Chronic diastolic CHF (congestive heart failure) ([place])   . Depression   . Anxiety state   . Orbital fracture ([place])   . Essential hypertension   . HLD (hyperlipidemia)   . Hypokalemia   . Hypomagnesemia   . Subdural hematoma ([place]) 08/28/2015  . Encephalopathy   . Hyperglycemia   . Acute respiratory failure with hypoxia ([place])   . Altered mental status 12/04/2013  . Diabetes mellitus ([place]) 08/31/2013  . Fibromyalgia 08/31/2013  . Acute encephalopathy 08/31/2013  . Sepsis ([place]) 08/31/2013  . Altered mental state 08/30/2013  . Chest pain 01/08/2011  . HTN (hypertension) 01/08/2011  . Depressed 01/08/2011    Orientation RESPIRATION BLADDER Height & Weight    Self, Time, Situation, Place  Normal Continent   155 lbs.  BEHAVIORAL SYMPTOMS/MOOD NEUROLOGICAL BOWEL NUTRITION STATUS      Continent Diet (Regular)   AMBULATORY STATUS COMMUNICATION OF NEEDS Skin   Limited Assist Verbally Surgical wounds                       Personal Care Assistance Level of Assistance  Dressing, Feeding, Bathing Bathing Assistance: Limited assistance Feeding assistance: Independent Dressing Assistance: Limited assistance     Functional Limitations Info             SPECIAL CARE FACTORS FREQUENCY  PT (By licensed PT), OT (By licensed OT)     PT Frequency: x3  weekly OT Frequency: x2  Weekly            Contractures Contractures Info: Not present    Additional Factors Info  Code Status, Allergies, Psychotropic Code Status Info: Full Code    Psychotropic Info: Klonapin, Prozac         Current Medications (09/05/2015):  This is the current hospital active medication list Current Facility-Administered Medications  Medication Dose Route Frequency Provider Last Rate Last Dose  . 0.9 %  sodium chloride infusion  250 mL Intravenous PRN Corky Sox, MD      . acetaminophen (TYLENOL) tablet 650 mg  650 mg Oral Q6H PRN Allie Bossier, MD   650 mg at 09/05/15 0849  . albuterol (PROVENTIL) (2.5 MG/3ML) 0.083% nebulizer solution 2.5 mg  2.5 mg Nebulization Q4H PRN Vishal Mungal, MD   2.5 mg at 09/01/15 1203  .  amLODipine (NORVASC) tablet 5 mg  5 mg Oral Daily Javier Glazier, MD   5 mg at 09/05/15 1044  . antiseptic oral rinse (CPC / CETYLPYRIDINIUM CHLORIDE 0.05%) solution 7 mL  7 mL Mouth Rinse q12n4p Margot Ables, RN   7 mL at 09/05/15 1200  . benzonatate (TESSALON) capsule 100 mg  100 mg Oral TID PRN Javier Glazier, MD   100 mg at 09/04/15 2214  . carvedilol (COREG) tablet 6.25 mg  6.25 mg Oral BID WC Thurnell Lose, MD   6.25 mg at 09/05/15 0829  . clonazePAM (KLONOPIN) tablet 1 mg  1 mg Oral Daily Thurnell Lose, MD   1 mg at 09/05/15 1044  . dextrose 5 % solution   Intravenous Continuous Chesley Mires, MD 10 mL/hr at 09/04/15 2132    . fluconazole (DIFLUCAN) tablet 100 mg  100 mg Oral  Daily Allie Bossier, MD   100 mg at 09/05/15 1044  . FLUoxetine (PROZAC) capsule 40 mg  40 mg Oral Daily Thurnell Lose, MD   40 mg at 09/05/15 0829  . hydrALAZINE (APRESOLINE) injection 10 mg  10 mg Intravenous Q4H PRN Chesley Mires, MD   10 mg at 09/04/15 0424  . insulin aspart (novoLOG) injection 0-20 Units  0-20 Units Subcutaneous 6 times per day Allie Bossier, MD   7 Units at 09/05/15 1217  . [START ON 09/06/2015] insulin glargine (LANTUS) injection 20 Units  20 Units Subcutaneous Daily Thurnell Lose, MD      . levETIRAcetam (KEPPRA) tablet 500 mg  500 mg Oral Q12H Thurnell Lose, MD      . nystatin (MYCOSTATIN) 100000 UNIT/ML suspension 500,000 Units  5 mL Mouth/Throat QID Thurnell Lose, MD   500,000 Units at 09/05/15 1436  . ondansetron (ZOFRAN) injection 4 mg  4 mg Intravenous Q6H PRN Javier Glazier, MD   4 mg at 09/04/15 2214  . pantoprazole (PROTONIX) EC tablet 40 mg  40 mg Oral Daily Thurnell Lose, MD   40 mg at 09/05/15 1045  . pravastatin (PRAVACHOL) tablet 60 mg  60 mg Oral q1800 Allie Bossier, MD   60 mg at 09/04/15 1819     Discharge Medications: Please see discharge summary for a list of discharge medications.  Relevant Imaging Results:  Relevant Lab Results:   Additional Information SSN:    [id number]  Pete Pelt

## 2015-09-05 NOTE — Progress Notes (Signed)
CSW was contacted and received for d/c planning to SNF.   CSW will meet with Pt for assessment and fax to surrounding facilities in [place] or [facility]   CSW will continue to follow for d/c planning.    [place] [phone]

## 2015-09-05 NOTE — Progress Notes (Signed)
Physical Therapy Treatment Patient Details Name: Rozanna Bankston Cisnero MRN: [id number] DOB: Apr 20, 1941 Today's Date: 09/05/2015    History of Present Illness Patient is a 75 yo female admitted 08/28/15 following a fall.  Patient with acute encephalopathy, SDH, facial fractures, hypokalemia.   PMH:  CVA, anxiety, depression, HTN, bradycardia, DM, neuropathy, chest pain, chronic pain    PT Comments    Pt aggitated and not wanting to be in bed when i arrived.  Pt walked 250 feet with RW - steadier with RW but still needed min assist due to loss of balance in both directions.  Pt unsafe with turning and backing up - needed max verbal cues.    Follow Up Recommendations  SNF;Supervision/Assistance - 24 hour     Equipment Recommendations  None recommended by PT    Recommendations for Other Services       Precautions / Restrictions Precautions Precautions: Fall Restrictions Weight Bearing Restrictions: No    Mobility  Bed Mobility                  Transfers Overall transfer level: Needs assistance Equipment used: Rolling walker (2 wheeled)   Sit to Stand: Min guard Stand pivot transfers: Min assist       General transfer comment: pt did toilet transfer as well.  Pt needed max cues for safety - to back up to the chair/toilet.  pt didnt reach back for chair or toilet - even with max assist  Ambulation/Gait Ambulation/Gait assistance: Min assist Ambulation Distance (Feet): 250 Feet Assistive device: Rolling walker (2 wheeled)       General Gait Details: Patient with slow, unsteady gait, staggering to both sides.  Pt safer with RW - pt said she has fallen in past - even with Rw   Stairs            Wheelchair Mobility    Modified Rankin (Stroke Patients Only)       Balance Overall balance assessment: Needs assistance                                  Cognition Arousal/Alertness: Awake/alert Behavior During Therapy: Restless;Agitated;Flat  affect Overall Cognitive Status: Impaired/Different from baseline                      Exercises      General Comments        Pertinent Vitals/Pain Pain Assessment: 0-10 Pain Score: 5  Pain Location: all over Pain Intervention(s): Repositioned    Home Living                      Prior Function            PT Goals (current goals can now be found in the care plan section) Progress towards PT goals: Progressing toward goals    Frequency  Min 3X/week    PT Plan      Co-evaluation             End of Session Equipment Utilized During Treatment: Gait belt Activity Tolerance: Patient tolerated treatment well Patient left: in chair;with call bell/phone within reach;with chair alarm set     Time: 1330-1350 PT Time Calculation (min) (ACUTE ONLY): 20 min  Charges:  $Gait Training: 8-22 mins                    G Codes:  Loyal Buba 09/05/2015, 2:04 PM 09/05/2015   Rande Lawman, PT

## 2015-09-05 NOTE — Progress Notes (Signed)
Occupational Therapy Evaluation Patient Details Name: Margaret Fischer MRN: [id number] DOB: [birth year] Today's Date: 09/05/2015    History of Present Illness Patient is a 75 yo female admitted 08/28/15 following a fall.  Patient with acute encephalopathy, SDH, facial fractures, hypokalemia.   PMH:  CVA, anxiety, depression, HTN, bradycardia, DM, neuropathy, chest pain, chronic pain   Clinical Impression   PTA, pt was independent with all ADLs and mobility. Pt currently requires min guard assist for mobility during ADLs due to balance impairments, dizziness, and generalized weakness. Pt was slow to respond to questions and appeared lethargic, but no family was present to determine if this is pt's baseline. Pt plans to d/c to SNF ([facility] preferably by pt). Pt will benefit from continued acute OT to increase independence and safety with ADLs and mobility to allow for safe discharge to venue listed below. Will continue to follow acutely.     Follow Up Recommendations  SNF ([facility])    Equipment Recommendations  Other (comment) (TBD in next venue)    Recommendations for Other Services       Precautions / Restrictions Precautions Precautions: Fall Restrictions Weight Bearing Restrictions: No      Mobility Bed Mobility Overal bed mobility: Needs Assistance Bed Mobility: Supine to Sit;Sit to Supine     Supine to sit: Supervision Sit to supine: Supervision   General bed mobility comments: HOB flat, no use of bedrails to simulate home environment.  Transfers Overall transfer level: Needs assistance Equipment used: Rolling walker (2 wheeled) Transfers: Sit to/from Stand Sit to Stand: Min guard         General transfer comment: Min guard assist for intial sit-stand due to HA pain, but no physical assist required. Pt with no overt LOB or reports of dizziness during OOB position changes.    Balance Overall balance assessment: Needs assistance Sitting-balance support: No upper  extremity supported;Feet supported Sitting balance-Leahy Scale: Good     Standing balance support: Bilateral upper extremity supported;During functional activity Standing balance-Leahy Scale: Poor                              ADL Overall ADL's : Needs assistance/impaired     Grooming: Wash/dry hands;Min guard;Standing               Lower Body Dressing: Set up;Sit to/from stand   Toilet Transfer: Min guard;Ambulation;Regular Toilet;Grab bars;RW   Toileting- Water quality scientist and Hygiene: Min guard;Sit to/from stand       Functional mobility during ADLs: Min guard;Rolling walker General ADL Comments: Min guard for mobility during ADLs for safety. Able to reach LB for dressing and bathing tasks when seated. No family present to confirm PLOF, home information or baseline functioning.       Vision Vision Assessment?: Yes Eye Alignment: Within Functional Limits Ocular Range of Motion: Within Functional Limits Alignment/Gaze Preference: Within Defined Limits Tracking/Visual Pursuits: Decreased smoothness of horizontal tracking;Decreased smoothness of vertical tracking;Decreased smoothness of eye movement to RIGHT superior field;Decreased smoothness of eye movement to LEFT superior field;Decreased smoothness of eye movement to RIGHT inferior field;Decreased smoothness of eye movement to LEFT inferior field;Requires cues, head turns, or add eye shifts to track (Nystagmus noted with horizontal/vertical tracking) Saccades: Additional eye shifts occurred during testing Convergence: Within functional limits Visual Fields: Other (comment) (Peripheral visual field loss ~30 degrees on R and L sides) Additional Comments: Pt reported she has vertigo causing dizziness - pt experienced this during session  when rolling in bed (nystagmus observed) but not during position changes.    Perception     Praxis      Pertinent Vitals/Pain Pain Assessment: 0-10 Pain Score: 7  Pain  Location: Headache and back Pain Descriptors / Indicators: Sore;Headache Pain Intervention(s): Limited activity within patient's tolerance;Monitored during session;Repositioned     Hand Dominance Right   Extremity/Trunk Assessment Upper Extremity Assessment Upper Extremity Assessment: Generalized weakness (ROM wfl, SENSATION: hx of neuropathy in hands)   Lower Extremity Assessment Lower Extremity Assessment: Generalized weakness (SENSATION: hx of neuropathy in lower legs and feet)   Cervical / Trunk Assessment Cervical / Trunk Assessment: Kyphotic   Communication Communication Communication: No difficulties (Soft voice and slow to respond)   Cognition Arousal/Alertness: Awake/alert Behavior During Therapy: Flat affect Overall Cognitive Status: No family/caregiver present to determine baseline cognitive functioning                     General Comments       Exercises       Shoulder Instructions      Home Living Family/patient expects to be discharged to:: Private residence Living Arrangements: Children;Non-relatives/Friends (Daughter and daughter's friend) Available Help at Discharge: Family;Available 24 hours/day Type of Home: House Home Access: Stairs to enter [employer] of Steps: 3 Entrance Stairs-Rails: None Home Layout: One level     Bathroom Shower/Tub: Tub/shower unit;Curtain Shower/tub characteristics: Architectural technologist: Standard Bathroom Accessibility: No   Home Equipment: Environmental consultant - 2 wheels;Bedside commode;Shower seat;Cane - single point;Hand held shower head          Prior Functioning/Environment Level of Independence: Independent with assistive device(s)        Comments: Ocassionally uses RW or SPC for mobility. Pt reports having hx of falls (5 falls in last year) and all when not using AD for mobility.    OT Diagnosis: Generalized weakness   OT Problem List: Decreased strength;Decreased range of motion;Decreased  activity tolerance;Impaired balance (sitting and/or standing);Decreased coordination;Decreased cognition;Decreased safety awareness;Decreased knowledge of use of DME or AE;Pain;Impaired sensation   OT Treatment/Interventions: Self-care/ADL training;Therapeutic exercise;Energy conservation;DME and/or AE instruction;Therapeutic activities;Cognitive remediation/compensation;Patient/family education;Balance training    OT Goals(Current goals can be found in the care plan section) Acute Rehab OT Goals Patient Stated Goal: to go to [facility] and then go home OT Goal Formulation: With patient Time For Goal Achievement: 09/19/15 Potential to Achieve Goals: Good ADL Goals Pt Will Perform Grooming: with modified independence;standing Pt Will Perform Lower Body Bathing: with modified independence;sit to/from stand Pt Will Perform Upper Body Dressing: with modified independence;sitting Pt Will Perform Lower Body Dressing: with modified independence;sit to/from stand Pt Will Transfer to Toilet: with modified independence;ambulating;regular height toilet Pt Will Perform Toileting - Clothing Manipulation and hygiene: with modified independence;sit to/from stand;sitting/lateral leans Pt Will Perform Tub/Shower Transfer: Tub transfer;with supervision;ambulating;shower seat;rolling walker  OT Frequency: Min 2X/week   Barriers to D/C: Decreased caregiver support  Unsure of daughter's availability/level of assistance she can provide       Co-evaluation              End of Session Equipment Utilized During Treatment: Gait belt;Rolling walker Nurse Communication: Mobility status  Activity Tolerance: Patient tolerated treatment well Patient left: in bed;with call bell/phone within reach;with bed alarm set   Time: 1006-1037 OT Time Calculation (min): 31 min Charges:  OT General Charges $OT Visit: 1 Procedure OT Evaluation $OT Eval Moderate Complexity: 1 Procedure OT Treatments $Self Care/Home  Management : 8-22 mins G-Codes:  Redmond Baseman, OTR/L Pager: [phone] 09/05/2015, 11:01 AM

## 2015-09-05 NOTE — Progress Notes (Signed)
Speech Language Pathology Dysphagia Treatment Patient Details Name: Margaret Fischer MRN: [id number] DOB: 1941/03/02 Today's Date: 09/05/2015 Time: 0920-0415 SLP Time Calculation (min) (ACUTE ONLY): 14 min  Assessment / Plan / Recommendation Clinical Impression    Pt was presented with POs at beside to advise diet advancement. Mental status improved from time of BSE. No overt s/s of penetration/aspiration were observed. Pt did not have dentures available but able to properly form bolus and orally transit regular solids. Pt reported pain in her gums after chewing cracker, but requested she be placed on regular diet with thin liquids. Hyolaryngeal elevation deemed adequate and timely initiation of the swallow was observed. No cues were required for functional performance. Pt and son were educated re: diet advancement. Recommend regular diet and thin liquids and d/c from SLP intervention.    Diet Recommendation    Regular, thin liquids   SLP Plan All goals met      Swallowing Goals     General HPI: 75 yo female former smoker presented with altered mental status and fall at home. Found to have UTI, lactic acidosis, fever and hyperglycemia. CT head showed Lt SDH, Lt orbital fx. She was intubated 1/8-1/11.  Oral Cavity - Oral Hygiene     Dysphagia Treatment Family/Caregiver Educated: son Treatment Methods: Skilled observation;Upgraded PO texture trial;Patient/caregiver education Patient observed directly with PO's: Yes Type of PO's observed: Regular;Thin liquids;Dysphagia 1 (puree) Feeding: Able to feed self Liquids provided via: Cup;Straw Oral Phase Signs & Symptoms:  (none found) Pharyngeal Phase Signs & Symptoms:  (none found) Type of cueing:  (none required) Amount of cueing: Independent   GO     Margaret Fischer 09/05/2015, 4:32 PM     Titus Mould, Student-SLP

## 2015-09-06 LAB — CBC WITH DIFFERENTIAL/PLATELET
BASOS ABS: 0 10*3/uL (ref 0.0–0.1)
BASOS PCT: 0 %
EOS PCT: 4 %
Eosinophils Absolute: 0.4 10*3/uL (ref 0.0–0.7)
HCT: 39.5 % (ref 36.0–46.0)
HEMOGLOBIN: 13.2 g/dL (ref 12.0–15.0)
LYMPHS PCT: 24 %
Lymphs Abs: 2.2 10*3/uL (ref 0.7–4.0)
MCH: 32.5 pg (ref 26.0–34.0)
MCHC: 33.4 g/dL (ref 30.0–36.0)
MCV: 97.3 fL (ref 78.0–100.0)
Monocytes Absolute: 0.9 10*3/uL (ref 0.1–1.0)
Monocytes Relative: 10 %
NEUTROS ABS: 5.7 10*3/uL (ref 1.7–7.7)
Neutrophils Relative %: 62 %
Platelets: 416 10*3/uL — ABNORMAL HIGH (ref 150–400)
RBC: 4.06 MIL/uL (ref 3.87–5.11)
RDW: 13.2 % (ref 11.5–15.5)
WBC: 9.2 10*3/uL (ref 4.0–10.5)

## 2015-09-06 LAB — COMPREHENSIVE METABOLIC PANEL
ALT: 13 U/L — ABNORMAL LOW (ref 14–54)
ANION GAP: 6 (ref 5–15)
AST: 20 U/L (ref 15–41)
Albumin: 3 g/dL — ABNORMAL LOW (ref 3.5–5.0)
Alkaline Phosphatase: 93 U/L (ref 38–126)
BILIRUBIN TOTAL: 1.2 mg/dL (ref 0.3–1.2)
BUN: 10 mg/dL (ref 6–20)
CALCIUM: 8.7 mg/dL — AB (ref 8.9–10.3)
CO2: 29 mmol/L (ref 22–32)
Chloride: 103 mmol/L (ref 101–111)
Creatinine, Ser: 0.47 mg/dL (ref 0.44–1.00)
GFR calc Af Amer: >60 mL/min (ref >60)
Glucose, Bld: 212 mg/dL — ABNORMAL HIGH (ref 65–99)
POTASSIUM: 4 mmol/L (ref 3.5–5.1)
Sodium: 138 mmol/L (ref 135–145)
TOTAL PROTEIN: 5.4 g/dL — AB (ref 6.5–8.1)

## 2015-09-06 LAB — GLUCOSE, CAPILLARY
GLUCOSE-CAPILLARY: 134 mg/dL — AB (ref 65–99)
GLUCOSE-CAPILLARY: 237 mg/dL — AB (ref 65–99)
GLUCOSE-CAPILLARY: 394 mg/dL — AB (ref 65–99)
Glucose-Capillary: 174 mg/dL — ABNORMAL HIGH (ref 65–99)
Glucose-Capillary: 339 mg/dL — ABNORMAL HIGH (ref 65–99)
Glucose-Capillary: 371 mg/dL — ABNORMAL HIGH (ref 65–99)
Glucose-Capillary: 390 mg/dL — ABNORMAL HIGH (ref 65–99)

## 2015-09-06 LAB — MAGNESIUM: MAGNESIUM: 2 mg/dL (ref 1.7–2.4)

## 2015-09-06 MED ORDER — CLONAZEPAM 0.5 MG PO TABS
0.5000 mg | ORAL_TABLET | Freq: Two times a day (BID) | ORAL | Status: DC
  Administered 2015-09-06 – 2015-09-07 (×2): 0.5 mg via ORAL
  Filled 2015-09-06 (×2): qty 1

## 2015-09-06 MED ORDER — GI COCKTAIL ~~LOC~~
30.0000 mL | Freq: Once | ORAL | Status: AC
  Administered 2015-09-06: 30 mL via ORAL
  Filled 2015-09-06: qty 30

## 2015-09-06 MED ORDER — HALOPERIDOL LACTATE 5 MG/ML IJ SOLN: 2.0000 mg | Freq: Four times a day (QID) | INTRAMUSCULAR | Status: DC | PRN

## 2015-09-06 MED ORDER — INSULIN GLARGINE 100 UNIT/ML ~~LOC~~ SOLN
15.0000 [IU] | Freq: Two times a day (BID) | SUBCUTANEOUS | Status: DC
  Administered 2015-09-06 – 2015-09-07 (×2): 15 [IU] via SUBCUTANEOUS
  Filled 2015-09-06 (×4): qty 0.15

## 2015-09-06 NOTE — Care Management Note (Signed)
[facility] Management Note  Patient Details  Name: Margaret Fischer MRN: [id number] Date of Birth: Jun 24, 1941  Subjective/Objective:                    Action/Plan: Plan is for SNF at discharge. CM continuing to follow.  Expected Discharge Date:                  Expected Discharge Plan:  Skilled Nursing Facility  In-House Referral:  Clinical Social Work  Discharge planning Services  CM Consult  Post Acute Care Choice:    Choice offered to:     DME Arranged:    DME Agency:     HH Arranged:    [place] Agency:     Status of Service:  In process, will continue to follow  Medicare Important Message Given:  Yes Date Medicare IM Given:    Medicare IM give by:    Date Additional Medicare IM Given:    Additional Medicare Important Message give by:     If discussed at [place] of Stay Meetings, dates discussed:    Additional Comments:  Pollie Friar, RN 09/06/2015, 4:16 PM

## 2015-09-06 NOTE — Progress Notes (Signed)
Advanced Directive request.  I met with patient and her son [name]. Son indicated that pt. Wanted to complete a HCPOA.  I spoke with patient and she confirmed this desire and indicated that her son knew what she wanted, stating that she did not want to be on a Vent or in a similar life prolonging situation when " I  could not live a good quality life. "  I reviewed the HCPOA form with her and assisted as she completed the document. It was witnessed and notarized with a copy given to the Unit Secretary for placement in the chart, and the original and two copies returned to patient and her son.   Margaret Fischer  Director [employer] [phone]

## 2015-09-06 NOTE — Progress Notes (Signed)
Progress Note  Margaret Fischer [id number] DOB: [birth year] DOA: 08/28/2015 PCP: Delia Chimes, NP  Admit HPI / Brief Narrative:  75 y.o.WF  PMHx CVA, Depression, Anxiety, Fibromyalgia HTN, Bradycardia, Chronic Chest Pain HLD, DM type II with Neuropathy, Migraines, Chronic Pain Syndrome, Basal Cell Carcinoma Nose, Drug Abuse (marijuana),  Presents to the ED by EMS w/ complaints of confusion. Per chart review and sn, patient has been somewhat confused over the past few days (lives with sister/daughhter) and was apparently naked and further confused this AM. She was found to have stool and vomit in her bed today. Son states that the patient thought she had a UTI a few days ago as this has been an issue for herr in the past. She also reportedly fell, hitting her left face and head on her fall. EMS was called, taken to the ED. Of note, patient was noted to have CBG's in the 400's, lactic acidosis of 4.5, fever, tachycardia, and CT head showing left subdural hematoma.   Patient was transferred under my care on 09/05/2015, she is stable from my standpoint, last night she get mildly delirious therefore a sitter was provided, this morning she is close to her baseline we'll discontinue the sitter. When necessary Haldol.  HPI/Subjective:  General in bed, denies any headache, no focal weakness, no fever or chills. No chest pain. Some heartburn and throat pain when she swallows.  Assessment/Plan:  Acute Respiratory Failure: -Resolved  Left Subdural Hematoma:  -Neurosurgery following. Minimal midline shift.  -1/13 CT Head without contrast. Patient's SDH improving but still some minimal shift see results below -Neurosurgery signed off today Dr.Benjamin Jared Ditty neurosurgery. patient follow up with him in 3 weeks with a non-contrast head CT follow-up date close to fifth of February. -PT; recommends SNF/CIR  Acute Encephalopathy:  -Multifactorial to include SDH, Drug use (UDS positive for THC).    -Resolved  Sepsis:  -Don't believe patient had true infection will DC all antibiotics .  Oropharyngeal candidiasis -Fluconazole 100 mg daily 7 days stop date 09/11/2015  Drug abuse (UDS positive marijuana) -Counseled patient on sequela of continuing to mix medication with illegal drugs to include death.   Left Orbital Fracture:  -Per [facility] M note Discussed Dr. Benjamine Mola. No acute intervention needed.  -Plan for outpatient follow-up with ENT.  Left hip pain/hematoma -1/14 hip x-ray shows negative fracture/dislocation see results below, currently pain-free will monitor.   HLD -Not within ADA guidelines -Increase Pravachol to 60 mg daily  Chronic Diastolic CHF -Completely compensated, EF close to 60%  HTN:  -Stable on Norvasc and as needed hydralazine  Depression & Anxiety:  -Increase Prozac to 40 mg daily which she takes at home  -Continue to Hold outpatient Neurontin, Elavil,  Start on Klonopin 1 mg daily for anxiety  Mild delirium. When necessary Haldol, following aspiration precautions.   Diabetes Mellitus Type II: On Lantus dose adjusted for better control +  sliding scale will monitor  Lab Results  Component Value Date   HGBA1C 11.1* 09/04/2015    CBG (last 3)   Recent Labs  09/06/15 0006 09/06/15 0240 09/06/15 0524  GLUCAP 339* 134* 237*       Code Status: FULL Family Communication: no family present at time of exam Disposition Plan: SNF vs CIR    Consultants: Dr. Benjamine Mola ENT Dr.Benjamin Kevan Ny Ditty neurosurgery   Procedure/Significant Events: CT HEAD 1/8: Left subdural hematoma w/ local mass effect & 38mm left to right midline shift.  CT HEAD 1/9: Left subdural hematoma  w/ 57mm left to right midline shift & trace subarachnoid hemorharrage on the left.  CT MAXILLOFACIAL 1/9: Depressed, comminuted Lt orbital fx with hemorrhage in Lt maxillary sinus Port CXR 1/11: Personally reviewed by me. Blunting of left costophrenic angle worsening from  possible effusion. Intubated. Slight improvement in right basilar hazy opacity. Right hilar opacity persists. No appreciated fractures. 1/13 CT head without contrast; Slightly decreased size of left subdural hematoma, now measuring1 cm in maximal thickness. -4-5 mm of left-to-right shift. No hydrocephalus or evidence of ventricular trapping. 1/14 echocardiogram;- Left ventricle: mild LVH. LVEF=55% to 60%. -(grade 1 diastolic dysfunction). - Left atrium: mildly dilated .-Inferior vena cava: Marland Kitchen The respirophasic diameter changes were blunted (< 50%), consistent with elevated CVP  1/14 left hip x-ray;-Moderate symmetric narrowing of both hip joints. Negative hip fracture/dislocation. 1/14 echocardiogram Left ventricle: mild LVH. -LVEF= 55% to 60%.-(grade 1 diastolic dysfunction). - Left atrium: mildly dilated -- Inferior vena cava: The vessel was dilated c/w  Elevated CVP   Culture 1/8 Blood left forearm 2 negative final 1/8 Urine Ctx >> Negative final 1/8 MRSA by PCR negative  1/8 Trach Asp Ctx positive rare Candida albicans 1/8 influenza A/B/H1N1 Negative   DVT prophylaxis: SCD   LINES / TUBES:  PIV x2 OETT 1/8 - 1/11  Anti-infectives    Start     Dose/Rate Route Frequency Ordered Stop   09/04/15 1800  fluconazole (DIFLUCAN) tablet 100 mg     100 mg Oral Daily 09/04/15 1632     08/28/15 2300  vancomycin (VANCOCIN) IVPB 1000 mg/200 mL premix  Status:  Discontinued     1,000 mg 200 mL/hr over 60 Minutes Intravenous Every 12 hours 08/28/15 1113 08/28/15 1702   08/28/15 1900  piperacillin-tazobactam (ZOSYN) IVPB 3.375 g  Status:  Discontinued     3.375 g 12.5 mL/hr over 240 Minutes Intravenous 3 times per day 08/28/15 1415 09/02/15 1821   08/28/15 1400  piperacillin-tazobactam (ZOSYN) IVPB 3.375 g  Status:  Discontinued     3.375 g 12.5 mL/hr over 240 Minutes Intravenous 3 times per day 08/28/15 1113 08/28/15 1415   08/28/15 1045  piperacillin-tazobactam (ZOSYN) IVPB 3.375 g      3.375 g 100 mL/hr over 30 Minutes Intravenous  Once 08/28/15 1031 08/28/15 1145   08/28/15 1045  vancomycin (VANCOCIN) IVPB 1000 mg/200 mL premix     1,000 mg 200 mL/hr over 60 Minutes Intravenous  Once 08/28/15 1031 08/28/15 1230       Continuous Infusions: . dextrose 10 mL/hr at 09/04/15 2132    Objective: VITAL SIGNS: Temp: 97.8 F (36.6 C) (01/17 0938) Temp Source: Oral (01/17 0938) BP: 133/47 mmHg (01/17 0938) Pulse Rate: 72 (01/17 0938) SPO2; FIO2:   Intake/Output Summary (Last 24 hours) at 09/06/15 1127 Last data filed at 09/06/15 0500  Gross per 24 hour  Intake    240 ml  Output    501 ml  Net   -261 ml     Exam: General:A/O 4, No acute respiratory distress Eyes: Positive headache, eye pain, negative double vision,negative scleral hemorrhage ENT: Negative Runny nose, negative gingival bleeding, white plaques along left lateral aspect of tongue and tonsillar pillar and uvula consistent with candidiasis Neck:  Negative scars, masses, torticollis, lymphadenopathy, positive JVD Lungs: Clear to auscultation bilaterally without wheezes or crackles Cardiovascular: Regular rate and rhythm without murmur gallop or rub normal S1 and S2 Abdomen:negative abdominal pain, nondistended, positive soft, bowel sounds, no rebound, no ascites, no appreciable mass Extremities: large bruise from  waist to mid thigh lateral aspect left thigh pain to palpation upper femur/trochanteric bursa, positive left thigh hematoma.  Psychiatric:  Negative depression, negative anxiety, negative fatigue, negative mania, flat affect  Neurologic:  Cranial nerves II through XII intact, tongue/uvula midline, all extremities muscle strength 5/5, sensation intact throughout,  negative dysarthria, negative expressive aphasia, negative receptive aphasia.   Data Reviewed: Basic Metabolic Panel:  Recent Labs Lab 09/02/15 1135 09/02/15 1450 09/03/15 0338 09/04/15 0432 09/05/15 0406 09/06/15 0717  NA  138  --  142 140 138 138  K 2.7* 3.0* 2.9* 3.2* 4.2 4.0  CL 99*  --  103 101 101 103  CO2 26  --  31 29 29 29   GLUCOSE 268*  --  216* 220* 295* 212*  BUN <5*  --  <5* <5* 7 10  CREATININE 0.59  --  0.37* 0.38* 0.47 0.47  CALCIUM 8.6*  --  8.2* 8.6* 8.4* 8.7*  MG  --  1.6* 2.0 1.7 2.1 2.0   Liver Function Tests:  Recent Labs Lab 09/03/15 0338 09/04/15 0432 09/05/15 0406 09/06/15 0717  AST 19 17 18 20   ALT 19 16 14  13*  ALKPHOS 100 97 97 93  BILITOT 1.5* 1.7* 1.6* 1.2  PROT 5.3* 5.7* 5.3* 5.4*  ALBUMIN 2.7* 2.9* 3.0* 3.0*   No results for input(s): LIPASE, AMYLASE in the last 168 hours. No results for input(s): AMMONIA in the last 168 hours. CBC:  Recent Labs Lab 09/02/15 0532 09/03/15 0338 09/04/15 0432 09/05/15 0406 09/06/15 0717  WBC 8.0 8.1 8.5 8.2 9.2  NEUTROABS  --  5.5 5.5 5.2 5.7  HGB 11.5* 12.6 13.2 12.9 13.2  HCT 34.0* 37.5 39.2 39.0 39.5  MCV 95.5 95.9 95.1 95.4 97.3  PLT 324 389 397 438* 416*   Cardiac Enzymes: No results for input(s): CKTOTAL, CKMB, CKMBINDEX, TROPONINI in the last 168 hours. BNP (last 3 results) No results for input(s): BNP in the last 8760 hours.  ProBNP (last 3 results) No results for input(s): PROBNP in the last 8760 hours.  CBG:  Recent Labs Lab 09/05/15 1636 09/05/15 2001 09/06/15 0006 09/06/15 0240 09/06/15 0524  GLUCAP 293* 282* 339* 134* 237*    Recent Results (from the past 240 hour(s))  Culture, Urine     Status: None   Collection Time: 08/28/15 10:21 AM  Result Value Ref Range Status   Specimen Description URINE, CATHETERIZED  Final   Special Requests ADDED 2315  Final   Culture NO GROWTH 2 DAYS  Final   Report Status 08/30/2015 FINAL  Final  Blood culture (routine x 2)     Status: None   Collection Time: 08/28/15 10:26 AM  Result Value Ref Range Status   Specimen Description BLOOD LEFT FOREARM  Final   Special Requests BOTTLES DRAWN AEROBIC AND ANAEROBIC 5MLS  Final   Culture NO GROWTH 5 DAYS  Final     Report Status 09/02/2015 FINAL  Final  Blood culture (routine x 2)     Status: None   Collection Time: 08/28/15 10:58 AM  Result Value Ref Range Status   Specimen Description BLOOD LEFT FOREARM  Final   Special Requests IN PEDIATRIC BOTTLE 2.5MLS  Final   Culture NO GROWTH 5 DAYS  Final   Report Status 09/02/2015 FINAL  Final  MRSA PCR Screening     Status: None   Collection Time: 08/28/15  4:53 PM  Result Value Ref Range Status   MRSA by PCR NEGATIVE NEGATIVE Final  Comment:        The GeneXpert MRSA Assay (FDA approved for NASAL specimens only), is one component of a comprehensive MRSA colonization surveillance program. It is not intended to diagnose MRSA infection nor to guide or monitor treatment for MRSA infections.   Culture, respiratory (tracheal aspirate)     Status: None   Collection Time: 08/28/15  4:59 PM  Result Value Ref Range Status   Specimen Description TRACHEAL ASPIRATE  Final   Special Requests NONE  Final   Gram Stain   Final    MODERATE WBC PRESENT, PREDOMINANTLY PMN RARE SQUAMOUS EPITHELIAL CELLS PRESENT RARE GRAM NEGATIVE RODS Performed at [facility]    Culture   Final    RARE CANDIDA ALBICANS Performed at [facility]    Report Status 08/30/2015 FINAL  Final     Studies:  Recent x-ray studies have been reviewed in detail by the Attending Physician  Scheduled Meds:  Scheduled Meds: . amLODipine  5 mg Oral Daily  . antiseptic oral rinse  7 mL Mouth Rinse q12n4p  . carvedilol  6.25 mg Oral BID WC  . clonazePAM  0.5 mg Oral BID  . fluconazole  100 mg Oral Daily  . FLUoxetine  40 mg Oral Daily  . insulin aspart  0-20 Units Subcutaneous 6 times per day  . insulin glargine  20 Units Subcutaneous Daily  . levETIRAcetam  500 mg Oral Q12H  . nystatin  5 mL Mouth/Throat QID  . pantoprazole  40 mg Oral Daily  . pravastatin  60 mg Oral q1800    Time spent on care of this patient: 30 mins  Signature  Lala Lund K  M.D on 09/06/2015 at 11:27 AM  Between 7am to 7pm - Pager - [phone], After 7pm go to [email or web address] - password [facility]  [facility]  - Office  [phone]

## 2015-09-06 NOTE — Progress Notes (Signed)
Pt observed to be lethargic and a little confused to place and situation, refusing to stay in bed, pt assisted to commode and back to bed, pt verbalized " I don't feel good" CBG checked at 0240 read 134, NP Lethea Killings paged and notified, called back and said to just continue to monitor pt for any obvious specific neuro changes, CN also notified and a NT was assigned to sit with pt until she feels better, oxygen at [facility] put on pt for comfort, will however continue to monitor. Obasogie-Asidi, Filipe Greathouse Efe

## 2015-09-07 DIAGNOSIS — I5032 Chronic diastolic (congestive) heart failure: Secondary | ICD-10-CM

## 2015-09-07 LAB — CBC WITH DIFFERENTIAL/PLATELET
BASOS ABS: 0 10*3/uL (ref 0.0–0.1)
BASOS PCT: 0 %
Eosinophils Absolute: 0.3 10*3/uL (ref 0.0–0.7)
Eosinophils Relative: 3 %
HEMATOCRIT: 40.3 % (ref 36.0–46.0)
Hemoglobin: 13.4 g/dL (ref 12.0–15.0)
LYMPHS PCT: 20 %
Lymphs Abs: 1.9 10*3/uL (ref 0.7–4.0)
MCH: 32.7 pg (ref 26.0–34.0)
MCHC: 33.3 g/dL (ref 30.0–36.0)
MCV: 98.3 fL (ref 78.0–100.0)
Monocytes Absolute: 1 10*3/uL (ref 0.1–1.0)
Monocytes Relative: 10 %
NEUTROS ABS: 6.5 10*3/uL (ref 1.7–7.7)
Neutrophils Relative %: 67 %
PLATELETS: 426 10*3/uL — AB (ref 150–400)
RBC: 4.1 MIL/uL (ref 3.87–5.11)
RDW: 13.3 % (ref 11.5–15.5)
WBC: 9.8 10*3/uL (ref 4.0–10.5)

## 2015-09-07 LAB — COMPREHENSIVE METABOLIC PANEL
ALK PHOS: 93 U/L (ref 38–126)
ALT: 11 U/L — ABNORMAL LOW (ref 14–54)
ANION GAP: 9 (ref 5–15)
AST: 21 U/L (ref 15–41)
Albumin: 3 g/dL — ABNORMAL LOW (ref 3.5–5.0)
BILIRUBIN TOTAL: 1.2 mg/dL (ref 0.3–1.2)
BUN: 12 mg/dL (ref 6–20)
CALCIUM: 8.7 mg/dL — AB (ref 8.9–10.3)
CO2: 28 mmol/L (ref 22–32)
Chloride: 100 mmol/L — ABNORMAL LOW (ref 101–111)
Creatinine, Ser: 0.48 mg/dL (ref 0.44–1.00)
Glucose, Bld: 220 mg/dL — ABNORMAL HIGH (ref 65–99)
POTASSIUM: 4 mmol/L (ref 3.5–5.1)
Sodium: 137 mmol/L (ref 135–145)
TOTAL PROTEIN: 5.6 g/dL — AB (ref 6.5–8.1)

## 2015-09-07 LAB — GLUCOSE, CAPILLARY
GLUCOSE-CAPILLARY: 211 mg/dL — AB (ref 65–99)
Glucose-Capillary: 107 mg/dL — ABNORMAL HIGH (ref 65–99)
Glucose-Capillary: 214 mg/dL — ABNORMAL HIGH (ref 65–99)
Glucose-Capillary: 178 mg/dL — ABNORMAL HIGH (ref 65–99)

## 2015-09-07 LAB — MAGNESIUM: Magnesium: 1.9 mg/dL (ref 1.7–2.4)

## 2015-09-07 MED ORDER — LEVETIRACETAM 500 MG PO TABS: 500.0000 mg | ORAL_TABLET | Freq: Two times a day (BID) | ORAL | Status: DC

## 2015-09-07 MED ORDER — OXYCODONE-ACETAMINOPHEN 5-325 MG PO TABS
1.0000 | ORAL_TABLET | ORAL | Status: DC | PRN
  Filled 2015-09-07: qty 1

## 2015-09-07 MED ORDER — PANTOPRAZOLE SODIUM 40 MG PO TBEC: 40.0000 mg | DELAYED_RELEASE_TABLET | Freq: Every day | ORAL | Status: AC

## 2015-09-07 MED ORDER — INSULIN ASPART 100 UNIT/ML ~~LOC~~ SOLN: SUBCUTANEOUS | Status: AC

## 2015-09-07 MED ORDER — BENZONATATE 100 MG PO CAPS: 100.0000 mg | ORAL_CAPSULE | Freq: Three times a day (TID) | ORAL | Status: AC | PRN

## 2015-09-07 MED ORDER — CARVEDILOL 6.25 MG PO TABS: 6.2500 mg | ORAL_TABLET | Freq: Two times a day (BID) | ORAL | Status: AC

## 2015-09-07 MED ORDER — FLUOXETINE HCL 40 MG PO CAPS: 40.0000 mg | ORAL_CAPSULE | Freq: Every day | ORAL | Status: AC

## 2015-09-07 MED ORDER — OXYCODONE-ACETAMINOPHEN 5-325 MG PO TABS
1.0000 | ORAL_TABLET | Freq: Four times a day (QID) | ORAL | Status: DC | PRN
Start: 2015-09-07 — End: 2015-09-13

## 2015-09-07 MED ORDER — FLUCONAZOLE 100 MG PO TABS: 100.0000 mg | ORAL_TABLET | Freq: Every day | ORAL | Status: DC

## 2015-09-07 MED ORDER — ONDANSETRON HCL 4 MG PO TABS: 4.0000 mg | ORAL_TABLET | Freq: Three times a day (TID) | ORAL | Status: DC | PRN

## 2015-09-07 MED ORDER — NYSTATIN 100000 UNIT/ML MT SUSP: 5.0000 mL | Freq: Four times a day (QID) | OROMUCOSAL | Status: AC

## 2015-09-07 MED ORDER — CLONAZEPAM 0.5 MG PO TABS: 0.5000 mg | ORAL_TABLET | Freq: Two times a day (BID) | ORAL | Status: AC

## 2015-09-07 NOTE — Discharge Summary (Signed)
Physician Discharge Summary   Patient ID: Margaret Fischer MRN: [id number] DOB/AGE: 04-16-1941 75 y.o.  Admit date: 08/28/2015 Discharge date: 09/07/2015  Primary Care Physician:  Delia Chimes, NP  Discharge Diagnoses:    . Subdural hematoma ([place]) . Acute respiratory failure with hypoxia ([place]) . Acute encephalopathy . Orbital fracture ([place]) . Essential hypertension . HLD (hyperlipidemia) . Hypokalemia . Hypomagnesemia . Depression . Anxiety state . Drug abuse, marijuana . 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 Left hip pain . Diabetes mellitus due to underlying condition with hyperosmolarity without coma, without long-term current use of insulin ([place]) . Chronic diastolic CHF (congestive heart failure) ([place])  Consults: Neurosurgery, Dr. Cyndy Freeze  Pulmonary critical care ENT  Recommendations for Outpatient Follow-up:  1. PT evaluation recommending skilled nursing facility 2. Please [facility]surgery, in 3 weeks. Patient will need non-contrast head CT follow-up date close to February 5th for SDH evaluation.  3. Please repeat CBC/BMET at next visit 4. Please follow-up with Dr. Benjamine Mola in 1 week   DIET: Carb modified diet    Allergies:   Allergies  Allergen Reactions  . Epinephrine Other (See Comments)    Tachycardia when used as a local anesthetic  . Hydromorphone Other (See Comments)    Unknown reaction  . Morphine And Related Other (See Comments)    Unknown reaction  . Phenergan [Promethazine Hcl] Other (See Comments)    " GOES WACK YOU WITH THIS MEDICATION" STATEMENT MADE BY PATIENT  . Succinylcholine Other (See Comments)    Unknown reaction  . Latex Rash     DISCHARGE MEDICATIONS: Current Discharge Medication List    START taking these medications   Details  benzonatate (TESSALON) 100 MG capsule Take 1 capsule (100 mg total) by mouth 3 (three) times daily as needed for cough. Qty: 20 capsule, Refills: 0     carvedilol (COREG) 6.25 MG tablet Take 1 tablet (6.25 mg total) by mouth 2 (two) times daily with a meal.    fluconazole (DIFLUCAN) 100 MG tablet Take 1 tablet (100 mg total) by mouth daily. X 7days    insulin aspart (NOVOLOG) 100 UNIT/ML injection Sliding scale  CBG 70 - 120: 0 units: CBG 121 - 150: 2 units; CBG 151 - 200: 3 units; CBG 201 - 250: 5 units; CBG 251 - 300: 8 units;CBG 301 - 350: 11 units; CBG 351 - 400: 15 units; CBG > 400 : 15 units and notify MD Qty: 10 mL, Refills: 11    levETIRAcetam (KEPPRA) 500 MG tablet Take 1 tablet (500 mg total) by mouth every 12 (twelve) hours. Qty: 60 tablet, Refills: 0    nystatin (MYCOSTATIN) 100000 UNIT/ML suspension Use as directed 5 mLs (500,000 Units total) in the mouth or throat 4 (four) times daily. Qty: 60 mL, Refills: 0    oxyCODONE-acetaminophen (PERCOCET/ROXICET) 5-325 MG tablet Take 1 tablet by mouth every 6 (six) hours as needed for moderate pain. Qty: 30 tablet, Refills: 0    pantoprazole (PROTONIX) 40 MG tablet Take 1 tablet (40 mg total) by mouth daily.      CONTINUE these medications which have CHANGED   Details  clonazePAM (KLONOPIN) 0.5 MG tablet Take 1 tablet (0.5 mg total) by mouth 2 (two) times daily. Qty: 30 tablet, Refills: 0    FLUoxetine (PROZAC) 40 MG capsule Take 1 capsule (40 mg total) by mouth daily. Qty: 30 capsule, Refills: 0      CONTINUE these medications which have NOT CHANGED   Details  insulin detemir (LEVEMIR) 100 UNIT/ML injection Inject 0.3 mLs (30 Units total) into the skin 2 (two) times daily. Qty: 10 mL, Refills: 11    metFORMIN (GLUCOPHAGE) 1000 MG tablet Take 1,000 mg by mouth 2 (two) times daily with a meal.    nitroGLYCERIN (NITROSTAT) 0.4 MG SL tablet Place 0.4 mg under the tongue every 5 (five) minutes as needed for chest pain.    amLODipine (NORVASC) 10 MG tablet Take 1 tablet (10 mg total) by mouth daily. Qty: 30 tablet, Refills: 0    Cholecalciferol (VITAMIN D-3 PO) Take by mouth  daily.      Cyanocobalamin (B-12 PO) Take 1 tablet by mouth daily.    diclofenac (VOLTAREN) 75 MG EC tablet Take 75 mg by mouth 2 (two) times daily.    glucosamine-chondroitin 500-400 MG tablet Take 1 tablet by mouth 2 (two) times daily.      lisinopril (PRINIVIL,ZESTRIL) 20 MG tablet Take 1 tablet (20 mg total) by mouth daily. Qty: 30 tablet, Refills: 0    pravastatin (PRAVACHOL) 40 MG tablet Take 1 tablet (40 mg total) by mouth daily. Qty: 30 tablet, Refills: 3      STOP taking these medications     gabapentin (NEURONTIN) 600 MG tablet      amitriptyline (ELAVIL) 10 MG tablet      aspirin 325 MG tablet      potassium chloride (KLOR-CON) 20 MEQ packet          Brief H and P: For complete details please refer to admission H and P, but in brief 75 y.o.WF PMHx CVA, Depression, Anxiety, Fibromyalgia HTN, Bradycardia, Chronic Chest Pain HLD, DM type II with Neuropathy, Migraines, Chronic Pain Syndrome, Basal Cell Carcinoma Nose, Drug Abuse (marijuana). Presented to the ED by EMS w/ complaints of confusion. Per chart review and son, patient had been somewhat confused over the past few days (lives with sister/daughter) and was apparently naked and further confused on the morning of admission. She was found to have stool and vomit in her bed. Son states that the patient thought she had a UTI a few days ago as this has been an issue for her in the past. She also reportedly fell, hitting her left face and head on her fall. EMS was called. Of note, patient was noted to have CBG's in the 400's, lactic acidosis of 4.5, fever, tachycardia, and CT head showing left subdural hematoma.    Hospital Course:   Acute Respiratory Failure: Patient was admitted by pulmonary critical care service. Patient was admitted with acute encephalopathy, vomiting prior to the fall. In ED she was found to be very hyperglycemic, encephalopathic and was intubated. CT head showed subdural hematoma. Patient was  admitted to ICU under the critical care service. Patient was extubated on 08/31/15. She was briefly started on IV vancomycin and Zosyn., Blood cultures remained negative, urine culture negative, influenza PCR negative. Tracheal aspirate showed Candida albicans. Patient was placed on Diflucan and nystatin. -Resolved, currently O2 sats 96% on room air.  Left Subdural Hematoma:  -neurosurgery was consulted. CT head on admission showed left subdural hematoma with local mass effect and 4 mm left-to-right midline shift. Serial CT head scans were obtained. CT head on 1/13 showed SDH improving but still some minimal shift. Neurosurgery signed off on 1/17.  Dr.Benjamin Jared Ditty neurosurgery recommended patient follow up with him in 3 weeks with a non-contrast head CT follow-up date close to fifth of February. -PT recommends SNF/CIR. Patient was seen by CIR  and recommended skilled nursing facility.  Acute Encephalopathy:  -Multifactorial to include SDH, Drug use (UDS positive for THC).  -Resolved  Sepsis:  -Don't believe patient had true infection will DC all antibiotics .  Oropharyngeal candidiasis -Fluconazole 100 mg daily 7 days  Drug abuse (UDS positive marijuana) -Counseled patient on risk and sequela of continuing to mix medication with illegal drugs which includes encephalopathy, respiratory failure, death.  Left Orbital Fracture:  -Per PCCM note Discussed Dr. Benjamine Mola. No acute intervention needed.  -Plan for outpatient follow-up with ENT.  Left hip pain/hematoma -1/14 hip x-ray shows negative fracture/dislocation see results below, currently pain-free will monitor.  HLD -Continue statin  Chronic Diastolic CHF -Completely compensated, EF close to 60%  HTN:  -Stable on Norvasc and as needed hydralazine  Depression & Anxiety:  -Increase Prozac to 40 mg daily which she takes at home  -Continue to Hold outpatient Neurontin, Elavil, continue Klonopin, 0.5 mg twice a  day   Procedure/Significant Events: CT HEAD 1/8: Left subdural hematoma w/ local mass effect & 47mm left to right midline shift.  CT HEAD 1/9: Left subdural hematoma w/ 22mm left to right midline shift & trace subarachnoid hemorharrage on the left.  CT MAXILLOFACIAL 1/9: Depressed, comminuted Lt orbital fx with hemorrhage in Lt maxillary sinus Port CXR 1/11: Personally reviewed by me. Blunting of left costophrenic angle worsening from possible effusion. Intubated. Slight improvement in right basilar hazy opacity. Right hilar opacity persists. No appreciated fractures. 1/13 CT head without contrast; Slightly decreased size of left subdural hematoma, now measuring1 cm in maximal thickness. -4-5 mm of left-to-right shift. No hydrocephalus or evidence of ventricular trapping. 1/14 echocardiogram;- Left ventricle: mild LVH. LVEF=55% to 60%. -(grade 1 diastolic dysfunction). - Left atrium: mildly dilated .-Inferior vena cava: Marland Kitchen The respirophasic diameter changes were blunted (< 50%), consistent with elevated CVP  1/14 left hip x-ray;-Moderate symmetric narrowing of both hip joints. Negative hip fracture/dislocation. 1/14 echocardiogram Left ventricle: mild LVH. -LVEF= 55% to 60%.-(grade 1 diastolic dysfunction). - Left atrium: mildly dilated -- Inferior vena cava: The vessel was dilated c/w Elevated CVP  Day of Discharge BP 150/55 mmHg  Pulse 64  Temp(Src) 98.4 F (36.9 C) (Oral)  Resp 16  Ht 5\' 6"  (1.676 m)  Wt 72.8 kg (160 lb 7.9 oz)  BMI 25.92 kg/m2  SpO2 96%  Physical Exam: General: Alert and awake oriented x3 not in any acute distress. HEENT: anicteric sclera, pupils reactive to light and accommodation, whitish plaques along the left lateral aspect of the tongue, tonsillar pillar and uvula CVS: S1-S2 clear no murmur rubs or gallops Chest: clear to auscultation bilaterally, no wheezing rales or rhonchi Abdomen: soft nontender, nondistended, normal bowel sounds Extremities: no  cyanosis, clubbing or edema noted bilaterally, bruise from waist to mid thigh, left thigh hematoma Neuro: Cranial nerves II-XII intact, no focal neurological deficits   The results of significant diagnostics from this hospitalization (including imaging, microbiology, ancillary and laboratory) are listed below for reference.    LAB RESULTS: Basic Metabolic Panel:  Recent Labs Lab 09/06/15 0717 09/07/15 0420  NA 138 137  K 4.0 4.0  CL 103 100*  CO2 29 28  GLUCOSE 212* 220*  BUN 10 12  CREATININE 0.47 0.48  CALCIUM 8.7* 8.7*  MG 2.0 1.9   Liver Function Tests:  Recent Labs Lab 09/06/15 0717 09/07/15 0420  AST 20 21  ALT 13* 11*  ALKPHOS 93 93  BILITOT 1.2 1.2  PROT 5.4* 5.6*  ALBUMIN 3.0* 3.0*  No results for input(s): LIPASE, AMYLASE in the last 168 hours. No results for input(s): AMMONIA in the last 168 hours. CBC:  Recent Labs Lab 09/06/15 0717 09/07/15 0420  WBC 9.2 9.8  NEUTROABS 5.7 6.5  HGB 13.2 13.4  HCT 39.5 40.3  MCV 97.3 98.3  PLT 416* 426*   Cardiac Enzymes: No results for input(s): CKTOTAL, CKMB, CKMBINDEX, TROPONINI in the last 168 hours. BNP: Invalid input(s): POCBNP CBG:  Recent Labs Lab 09/07/15 0120 09/07/15 0416  GLUCAP 107* 214*    Significant Diagnostic Studies:  Ct Head Wo Contrast  08/29/2015  CLINICAL DATA:  Follow-up LEFT subdural hematoma, altered mental status. Struck head yesterday. EXAM: CT HEAD WITHOUT CONTRAST TECHNIQUE: Contiguous axial images were obtained from the base of the skull through the vertex without intravenous contrast. COMPARISON:  CT head August 28, 2015 FINDINGS: LEFT holo hemispheric dense acute subdural hematoma measuring up to 12 mm with resultant 2 mm LEFT-to-RIGHT midline shift. 2 mm LEFT parafalcine subdural hematoma with trace subarachnoid hemorrhage along the interhemispheric fissure, RIGHT frontal sulci. No ventricular entrapment or hydrocephalus. No intraparenchymal hemorrhage. No acute large  vascular territory infarct. Basal cisterns are patent. Moderate calcific atherosclerosis of the carotid siphons. Small LEFT maxillary sinus air-fluid level. Suspected LEFT orbital floor fracture may be acute. Mastoid air cells are well aerated. No skull fracture. IMPRESSION: Similar appearance of acute 12 mm holo hemispheric subdural hematoma resulting in 2 mm LEFT-to-RIGHT midline shift. 2 mm parafalcine subdural hematoma with small amount of subarachnoid hemorrhage. Suspected LEFT orbital floor fracture may be acute. Recommend dedicated maxillofacial CT. Electronically Signed   By: Elon Alas M.D.   On: 08/29/2015 05:03   Ct Head Wo Contrast  08/28/2015  CLINICAL DATA:  Followup subdural hematoma. EXAM: CT HEAD WITHOUT CONTRAST TECHNIQUE: Contiguous axial images were obtained from the base of the skull through the vertex without intravenous contrast. COMPARISON:  03/11/2014 FINDINGS: There is a large left subdural hematoma measuring 13 mm in maximum thickness, image 17 of series 201. This extends anteriorly over the left frontal lobe with a parafalcine component measuring 3 mm in thickness. The ventricular volumes appear normal. No hydrocephalus or intracranial hemorrhage. There is mild left-to-right midline shift of 2 mm. Prominence of the sulci identified compatible with brain atrophy. There is mild diffuse low attenuation within the subcortical and periventricular white matter compatible with chronic microvascular disease.Fluid level is noted within the left maxillary sinus. The mastoid air cells are clear. The calvarium is intact. IMPRESSION: 1. Acute left subdural hematoma measuring 13 mm and causing approximately 2 mm of left-to-right midline shift. 2. Small vessel ischemic change and brain atrophy. Electronically Signed   By: Kerby Moors M.D.   On: 08/28/2015 15:11   Ct Head Wo Contrast  08/28/2015  CLINICAL DATA:  75 year old female with altered mental status and agitated EXAM: CT HEAD  WITHOUT CONTRAST TECHNIQUE: Contiguous axial images were obtained from the base of the skull through the vertex without intravenous contrast. COMPARISON:  Prior head CT 03/11/2014 FINDINGS: Acute crescentic extra-axial high attenuation fluid overlying the left cerebral convexity consistent with acute subdural hematoma. The maximal depth at the mid temporal lobe is approximately 13 mm. Additionally, there is likely some focal subdural blood overlying the right frontal lobe as well although this is in a region of combined streak and beam hardening artifact. No evidence of mass, mass effect, hydrocephalus or acute infarct. No intraventricular or subarachnoid hemorrhage visualized. Approximately 4 mm left-to-right midline shift. Motion artifact slightly limits  evaluation for subtle calvarial fracture. No definite displaced calvarial fracture identified. Grossly normally aerated mastoid air cells and paranasal sinuses save for a small amount of hemo sinus layering in the left maxillary sinus. Incompletely imaged soft tissue contusion overlying the left maxillary antrum. There is slight irregularity of the inferior wall of the left orbit. Fracture is not excluded. IMPRESSION: 1. Acute subdural hematoma overlying the left cerebral convex [place] measuring up to 13 mm in width. There is associated local mass effect with 4 mm left-to-right midline shift. 2. Additional small volume subdural hematoma versus artifact overlying the right frontal lobe. 3. Incompletely imaged soft tissue contusion overlying the left maxillary antrum with associated small volume left maxillary hemo sinus. 4. Question of possible left orbital floor fracture which is incompletely imaged. Recommend further evaluation with dedicated CT scan of the face when the patient is able to cooperate and there is less potential for severe motion artifact. Critical Value/emergent results were called by telephone at the time of interpretation on 08/28/2015 at 11:55 am  to Dr. Pattricia Boss , who verbally acknowledged these results. Electronically Signed   By: Jacqulynn Cadet M.D.   On: 08/28/2015 11:56   Dg Chest Port 1 View  08/29/2015  CLINICAL DATA:  75 year old female with respiratory failure. Subsequent encounter. EXAM: PORTABLE CHEST 1 VIEW COMPARISON:  08/28/2015. FINDINGS: Endotracheal tube tip 4 cm above the carina. Nasogastric tube courses below the diaphragm. Tip is not included on the present exam. Cardiomegaly. Pulmonary vascular prominence most notable centrally. Basilar subsegmental atelectasis with poor inspiration. Basilar infiltrate secondary less likely consideration. No gross pneumothorax. Calcified aorta is slightly tortuous. IMPRESSION: Poor inspiratory portable exam with pulmonary vascular prominence most notable centrally. Basilar subsegmental atelectasis. Nasogastric tube courses below the diaphragm. Tip is not included on the present exam. Electronically Signed   By: Genia Del M.D.   On: 08/29/2015 08:05   Dg Chest Portable 1 View  08/28/2015  CLINICAL DATA:  Patient status post intubation. Loss of consciousness. EXAM: PORTABLE CHEST 1 VIEW COMPARISON:  Chest radiograph 01/27/2015. FINDINGS: ET tube terminates in the mid trachea. Stable cardiomegaly. Minimal heterogeneous opacities left lung base. No pleural effusion or pneumothorax. Old left lateral rib fracture. IMPRESSION: ET tube terminates in mid trachea. Probable left basilar atelectasis. Electronically Signed   By: Lovey Newcomer M.D.   On: 08/28/2015 10:39   Dg Abd Portable 1v  08/28/2015  CLINICAL DATA:  Encounter for orogastric (OG) tube placement EXAM: PORTABLE ABDOMEN - 1 VIEW COMPARISON:  None. FINDINGS: OG tube in the left upper quadrant. Its position suggests it lies along the greater curvature with tip in the distal body. IMPRESSION: OG tube as described. Electronically Signed   By: Skipper Cliche M.D.   On: 08/28/2015 19:23   Ct Maxillofacial Wo Cm  08/29/2015  CLINICAL DATA:   Patient came in yesterday with AMS, SDH Scan showed possible left orbital fracture. Left eye is swollen and bruised EXAM: CT MAXILLOFACIAL WITHOUT CONTRAST TECHNIQUE: Multidetector CT imaging of the maxillofacial structures was performed. Multiplanar CT image reconstructions were also generated. A small metallic BB was placed on the right temple in order to reliably differentiate right from left. COMPARISON:  Head CT, 08/29/2015 at 4:45 a.m. FINDINGS: There is a depressed comminuted fracture of the left orbital floor. Fracture is depressed 9 mm. Oral fat extends through the defect. There is no entrapment of the inferior rectus muscle. There is mild left lateral periorbital soft tissue edema. Patient has had a previous left  cataract surgery. Left globe otherwise unremarkable. No abnormality of the postseptal left orbit. There are no other fractures. Dependent fluid is seen in the left maxillary sinus. Sinuses otherwise clear. Clear mastoid air cells and middle ear cavities. There are indwelling nasogastric and endotracheal tubes. No neck masses or adenopathy. IMPRESSION: 1. Depressed, comminuted left orbital floor fracture associated with dependent hemorrhage in the left maxillary sinus. No extraocular muscle entrapment. No other fractures. Electronically Signed   By: Lajean Manes M.D.   On: 08/29/2015 13:51    2D ECHO: Study Conclusions  - Left ventricle: The cavity size was normal. Wall thickness was increased in a pattern of mild LVH. Systolic function was normal. The estimated ejection fraction was in the range of 55% to 60%. Wall motion was normal; there were no regional wall motion abnormalities. Doppler parameters are consistent with abnormal left ventricular relaxation (grade 1 diastolic dysfunction). The E/e&' ratio is between 8-15, suggesting indeterminate LV filling pressure. - Aortic valve: Trileaflet. Sclerosis without stenosis. There was no regurgitation. - Mitral valve:  Mildly thickened leaflets . There was trivial regurgitation. - Left atrium: The atrium was mildly dilated at 34 ml/m2. - Inferior vena cava: The vessel was dilated. The respirophasic diameter changes were blunted (< 50%), consistent with elevated central venous pressure.  Impressions:  - Compared to a prior echo in 2012, the EF remains the same. There is diastolic dysfunction with indeterminate LV filling pressure. RVSP could not be estimated.  Disposition and Follow-up:    DISPOSITION: Skilled nursing facility   DISCHARGE FOLLOW-UP Follow-up Information    Follow up with [facility], NP. Schedule an appointment as soon as possible for a visit in 2 weeks.   Specialty:  Nurse Practitioner   Why:  for hospital follow-up   Contact information:   [address] [place] [address] [phone]       Follow up with Kevan Ny Ditty, MD. Schedule an appointment as soon as possible for a visit in 2 weeks.   Specialty:  Neurosurgery   Why:  for hospital follow-up   Contact information:   [place] [address] [phone]       Follow up with Ascencion Dike, MD. Schedule an appointment as soon as possible for a visit in 1 week.   Specialty:  Otolaryngology   Why:  for hospital follow-up   Contact information:   [address] [place] [address] [phone]        Time spent on Discharge: 45 minutes  Signed:   Alfreda Hammad M.D. [facility] 09/07/2015, 11:07 AM Pager: [phone]

## 2015-09-07 NOTE — Clinical Social Work Note (Signed)
[place] MUST PASARR confirmed: [id number] Rozell Searing, MSW, LCSWA [phone] 09/07/2015 12:48 PM

## 2015-09-07 NOTE — Clinical Social Work Note (Signed)
Clinical Social Worker facilitated patient discharge including contacting patient family and facility to confirm patient discharge plans.  Clinical information faxed to facility and family agreeable with plan.  CSW arranged ambulance transport via [facility] to [facility].  RN to call report prior to discharge.  Clinical Social Worker will sign off for now as social work intervention is no longer needed. Please consult [place] again if new need arises.  Glendon Axe, MSW, LCSWA [phone] 09/07/2015 1:00 PM

## 2015-09-07 NOTE — Clinical Social Work Placement (Signed)
   CLINICAL SOCIAL WORK PLACEMENT  NOTE  Date:  09/07/2015  Patient Details  Name: Margaret Fischer MRN: [id number] Date of Birth: Apr 12, 1941  Clinical Social Work is seeking post-discharge placement for this patient at the [place] level of care (*CSW will initial, date and re-position this form in  chart as items are completed):  Yes   Patient/family provided with [facility] Work Department's list of facilities offering this level of care within the geographic area requested by the patient (or if unable, by the patient's family).  Yes   Patient/family informed of their freedom to choose among providers that offer the needed level of care, that participate in Medicare, Medicaid or managed care program needed by the patient, have an available bed and are willing to accept the patient.  Yes   Patient/family informed of [facility]'s ownership interest in [facility] and [facility], as well as of the fact that they are under no obligation to receive care at these facilities.  PASRR submitted to EDS on       PASRR number received on       Existing PASRR number confirmed on 09/05/15     FL2 transmitted to all facilities in geographic area requested by pt/family on 09/07/15     FL2 transmitted to all facilities within larger geographic area on       Patient informed that his/her managed care company has contracts with or will negotiate with certain facilities, including the following:        Yes   Patient/family informed of bed offers received.  Patient chooses bed at  [facility] )     Physician recommends and patient chooses bed at      Patient to be transferred to  [facility] ) on 09/07/15.  Patient to be transferred to facility by  [facility] )     Patient family notified on 09/07/15 of transfer.  Name of family member notified:   (Pt's son, [name] )     PHYSICIAN Please prepare priority discharge  summary, including medications     Additional Comment:    _______________________________________________ Rozell Searing, LCSW 09/07/2015, 12:59 PM

## 2015-09-07 NOTE — Progress Notes (Signed)
Physical Therapy Treatment Patient Details Name: Margaret Fischer MRN: [id number] DOB: 08-23-1940 Today's Date: 09/07/2015    History of Present Illness Patient is a 75 yo female admitted 08/28/15 following a fall.  Patient with acute encephalopathy, SDH, facial fractures, hypokalemia.   PMH:  CVA, anxiety, depression, HTN, bradycardia, DM, neuropathy, chest pain, chronic pain    PT Comments    Pt progressing slowly towards physical therapy goals. Pt reports continual feeling that she is going to pass out throughout session. VSS with  BP at 131/48, HR at 68 and O2 at 97% on RA. Pt was returned to bed after minimal ambulation and she reports improvement in symptoms. Will continue to follow.   Follow Up Recommendations  SNF;Supervision/Assistance - 24 hour     Equipment Recommendations  None recommended by PT    Recommendations for Other Services       Precautions / Restrictions Precautions Precautions: Fall Restrictions Weight Bearing Restrictions: No    Mobility  Bed Mobility Overal bed mobility: Needs Assistance Bed Mobility: Sit to Supine       Sit to supine: Supervision   General bed mobility comments: HOB flat. Pt with use of railings for support, and increased UE support required when scooting up in the bed.   Transfers Overall transfer level: Needs assistance Equipment used: Rolling walker (2 wheeled) Transfers: Sit to/from Stand Sit to Stand: Min guard         General transfer comment: Sit<>stand x2. Upon first attempt pt held static standing ~10 seconds and had sudden uncontrolled descent to the chair. Upon second attempt pt held to walker and was able to maintain standing balance with hands-on guarding.   Ambulation/Gait Ambulation/Gait assistance: Min assist Ambulation Distance (Feet): 15 Feet Assistive device: Rolling walker (2 wheeled) Gait Pattern/deviations: Step-through pattern;Decreased stride length Gait velocity: decreased Gait velocity  interpretation: Below normal speed for age/gender General Gait Details: Plan was originally to walk towards the hallway, however once up and moving pt reports she feels she is going to pass out and she walked around the bed to return to supine.    Stairs            Wheelchair Mobility    Modified Rankin (Stroke Patients Only) Modified Rankin (Stroke Patients Only) Pre-Morbid Rankin Score: No significant disability Modified Rankin: Moderately severe disability     Balance Overall balance assessment: Needs assistance Sitting-balance support: Feet supported;No upper extremity supported Sitting balance-Leahy Scale: Good     Standing balance support: No upper extremity supported;During functional activity Standing balance-Leahy Scale: Poor Standing balance comment: Sudden uncontrolled descent to chair noted.                     Cognition Arousal/Alertness: Awake/alert Behavior During Therapy: Flat affect Overall Cognitive Status: No family/caregiver present to determine baseline cognitive functioning                      Exercises      General Comments General comments (skin integrity, edema, etc.): Signficant bruising noted on back/buttocks/hips      Pertinent Vitals/Pain Pain Assessment: Faces Pain Score: 0-No pain Faces Pain Scale: Hurts little more Pain Location: Unable to give a pain location, just grimacing with general mobility. Pain Intervention(s): Repositioned    Home Living                      Prior Function  PT Goals (current goals can now be found in the care plan section) Acute Rehab PT Goals PT Goal Formulation: With patient Time For Goal Achievement: 09/10/15 Potential to Achieve Goals: Good Progress towards PT goals: Progressing toward goals    Frequency  Min 3X/week    PT Plan Current plan remains appropriate    Co-evaluation             End of Session Equipment Utilized During Treatment:  Gait belt Activity Tolerance: Patient tolerated treatment well Patient left: in bed;with call bell/phone within reach;with bed alarm set     Time: 1030-1045 PT Time Calculation (min) (ACUTE ONLY): 15 min  Charges:  $Therapeutic Activity: 8-22 mins                    G Codes:      Rolinda Roan 10-02-2015, 12:19 PM   Rolinda Roan, PT, DPT Acute Rehabilitation Services Pager: [phone]

## 2015-09-07 NOTE — Clinical Social Work Note (Signed)
Clinical Social Worker has extended available bed offers to pt's son, Darren via phone and email.   [facility] bed offer pending. DON will review information again and facility will follow up with CSW. CSW encouraged pt's son to review/choose from available offers. Patient's son planning to review offers with pt's sister and contact CSW with decision.   CSW remains available as needed.   Glendon Axe, MSW, LCSWA [phone] 09/07/2015 10:30 AM

## 2015-09-07 NOTE — Progress Notes (Signed)
Pt to discharge to SNF. PTAR present to transport. IV removed earlier in the day. Called report to [facility]. Spoke with [employer]. Pt left via stretcher at 1356.

## 2015-09-11 ENCOUNTER — Emergency Department (HOSPITAL_COMMUNITY): Payer: Medicare Other

## 2015-09-11 ENCOUNTER — Encounter (HOSPITAL_COMMUNITY): Payer: Self-pay | Admitting: *Deleted

## 2015-09-11 ENCOUNTER — Inpatient Hospital Stay (HOSPITAL_COMMUNITY)
Admission: EM | Admit: 2015-09-11 | Discharge: 2015-09-13 | DRG: 092 | Disposition: A | Discharge status: Skilled Nursing Facility | Payer: Medicare Other | Attending: Internal Medicine | Admitting: Internal Medicine

## 2015-09-11 ENCOUNTER — Inpatient Hospital Stay (HOSPITAL_COMMUNITY): Payer: Medicare Other

## 2015-09-11 DIAGNOSIS — Z87891 Personal history of nicotine dependence: Secondary | ICD-10-CM

## 2015-09-11 DIAGNOSIS — E11649 Type 2 diabetes mellitus with hypoglycemia without coma: Secondary | ICD-10-CM | POA: Diagnosis not present

## 2015-09-11 DIAGNOSIS — S069XAS Unspecified intracranial injury with loss of consciousness status unknown, sequela: Secondary | ICD-10-CM

## 2015-09-11 DIAGNOSIS — W19XXXS Unspecified fall, sequela: Secondary | ICD-10-CM | POA: Diagnosis present

## 2015-09-11 DIAGNOSIS — I6200 Nontraumatic subdural hemorrhage, unspecified: Secondary | ICD-10-CM

## 2015-09-11 DIAGNOSIS — T426X5A Adverse effect of other antiepileptic and sedative-hypnotic drugs, initial encounter: Secondary | ICD-10-CM | POA: Diagnosis present

## 2015-09-11 DIAGNOSIS — F1210 Cannabis abuse, uncomplicated: Secondary | ICD-10-CM | POA: Diagnosis present

## 2015-09-11 DIAGNOSIS — E872 Acidosis, unspecified: Secondary | ICD-10-CM | POA: Insufficient documentation

## 2015-09-11 DIAGNOSIS — G9340 Encephalopathy, unspecified: Secondary | ICD-10-CM

## 2015-09-11 DIAGNOSIS — S069X9S Unspecified intracranial injury with loss of consciousness of unspecified duration, sequela: Secondary | ICD-10-CM

## 2015-09-11 DIAGNOSIS — E1165 Type 2 diabetes mellitus with hyperglycemia: Secondary | ICD-10-CM | POA: Diagnosis not present

## 2015-09-11 DIAGNOSIS — E785 Hyperlipidemia, unspecified: Secondary | ICD-10-CM | POA: Diagnosis present

## 2015-09-11 DIAGNOSIS — Z885 Allergy status to narcotic agent status: Secondary | ICD-10-CM

## 2015-09-11 DIAGNOSIS — G8929 Other chronic pain: Secondary | ICD-10-CM | POA: Diagnosis not present

## 2015-09-11 DIAGNOSIS — E876 Hypokalemia: Secondary | ICD-10-CM | POA: Diagnosis not present

## 2015-09-11 DIAGNOSIS — M797 Fibromyalgia: Secondary | ICD-10-CM | POA: Diagnosis present

## 2015-09-11 DIAGNOSIS — F329 Major depressive disorder, single episode, unspecified: Secondary | ICD-10-CM | POA: Diagnosis present

## 2015-09-11 DIAGNOSIS — Z888 Allergy status to other drugs, medicaments and biological substances status: Secondary | ICD-10-CM

## 2015-09-11 DIAGNOSIS — Y92122 Bedroom in nursing home as the place of occurrence of the external cause: Secondary | ICD-10-CM

## 2015-09-11 DIAGNOSIS — F1910 Other psychoactive substance abuse, uncomplicated: Secondary | ICD-10-CM

## 2015-09-11 DIAGNOSIS — M549 Dorsalgia, unspecified: Secondary | ICD-10-CM | POA: Diagnosis present

## 2015-09-11 DIAGNOSIS — R471 Dysarthria and anarthria: Secondary | ICD-10-CM | POA: Diagnosis present

## 2015-09-11 DIAGNOSIS — S065X0S Traumatic subdural hemorrhage without loss of consciousness, sequela: Secondary | ICD-10-CM

## 2015-09-11 DIAGNOSIS — R1310 Dysphagia, unspecified: Secondary | ICD-10-CM | POA: Diagnosis not present

## 2015-09-11 DIAGNOSIS — R4182 Altered mental status, unspecified: Secondary | ICD-10-CM | POA: Diagnosis present

## 2015-09-11 DIAGNOSIS — S065X9A Traumatic subdural hemorrhage with loss of consciousness of unspecified duration, initial encounter: Secondary | ICD-10-CM | POA: Diagnosis present

## 2015-09-11 DIAGNOSIS — E1100 Type 2 diabetes mellitus with hyperosmolarity without nonketotic hyperglycemic-hyperosmolar coma (NKHHC): Secondary | ICD-10-CM

## 2015-09-11 DIAGNOSIS — I10 Essential (primary) hypertension: Secondary | ICD-10-CM | POA: Diagnosis present

## 2015-09-11 DIAGNOSIS — R4702 Dysphasia: Secondary | ICD-10-CM

## 2015-09-11 DIAGNOSIS — Z886 Allergy status to analgesic agent status: Secondary | ICD-10-CM

## 2015-09-11 DIAGNOSIS — F0630 Mood disorder due to known physiological condition, unspecified: Secondary | ICD-10-CM

## 2015-09-11 DIAGNOSIS — S065XAA Traumatic subdural hemorrhage with loss of consciousness status unknown, initial encounter: Secondary | ICD-10-CM | POA: Diagnosis present

## 2015-09-11 DIAGNOSIS — D473 Essential (hemorrhagic) thrombocythemia: Secondary | ICD-10-CM | POA: Diagnosis not present

## 2015-09-11 DIAGNOSIS — G92 Toxic encephalopathy: Secondary | ICD-10-CM | POA: Diagnosis not present

## 2015-09-11 DIAGNOSIS — F419 Anxiety disorder, unspecified: Secondary | ICD-10-CM | POA: Diagnosis present

## 2015-09-11 DIAGNOSIS — E869 Volume depletion, unspecified: Secondary | ICD-10-CM | POA: Diagnosis present

## 2015-09-11 DIAGNOSIS — Z91040 Latex allergy status: Secondary | ICD-10-CM

## 2015-09-11 DIAGNOSIS — R7989 Other specified abnormal findings of blood chemistry: Secondary | ICD-10-CM | POA: Diagnosis present

## 2015-09-11 DIAGNOSIS — S069X0D Unspecified intracranial injury without loss of consciousness, subsequent encounter: Secondary | ICD-10-CM

## 2015-09-11 DIAGNOSIS — D75839 Thrombocytosis, unspecified: Secondary | ICD-10-CM | POA: Diagnosis present

## 2015-09-11 DIAGNOSIS — R4701 Aphasia: Secondary | ICD-10-CM | POA: Diagnosis not present

## 2015-09-11 DIAGNOSIS — E119 Type 2 diabetes mellitus without complications: Secondary | ICD-10-CM

## 2015-09-11 DIAGNOSIS — T383X5A Adverse effect of insulin and oral hypoglycemic [antidiabetic] drugs, initial encounter: Secondary | ICD-10-CM | POA: Diagnosis present

## 2015-09-11 LAB — COMPREHENSIVE METABOLIC PANEL
ALK PHOS: 96 U/L (ref 38–126)
ALT: 8 U/L — AB (ref 14–54)
AST: 21 U/L (ref 15–41)
Albumin: 3 g/dL — ABNORMAL LOW (ref 3.5–5.0)
Anion gap: 12 (ref 5–15)
BILIRUBIN TOTAL: 0.7 mg/dL (ref 0.3–1.2)
BUN: 16 mg/dL (ref 6–20)
CHLORIDE: 103 mmol/L (ref 101–111)
CO2: 25 mmol/L (ref 22–32)
CREATININE: 0.49 mg/dL (ref 0.44–1.00)
Calcium: 9.5 mg/dL (ref 8.9–10.3)
Glucose, Bld: 111 mg/dL — ABNORMAL HIGH (ref 65–99)
Potassium: 4.1 mmol/L (ref 3.5–5.1)
Sodium: 140 mmol/L (ref 135–145)
Total Protein: 5.7 g/dL — ABNORMAL LOW (ref 6.5–8.1)

## 2015-09-11 LAB — I-STAT CG4 LACTIC ACID, ED
LACTIC ACID, VENOUS: 1.82 mmol/L (ref 0.5–2.0)
Lactic Acid, Venous: 3.93 mmol/L (ref 0.5–2.0)

## 2015-09-11 LAB — GLUCOSE, CAPILLARY
GLUCOSE-CAPILLARY: 154 mg/dL — AB (ref 65–99)
GLUCOSE-CAPILLARY: 50 mg/dL — AB (ref 65–99)
GLUCOSE-CAPILLARY: 159 mg/dL — AB (ref 65–99)

## 2015-09-11 LAB — CBC WITH DIFFERENTIAL/PLATELET
Basophils Absolute: 0 10*3/uL (ref 0.0–0.1)
Basophils Relative: 0 %
Eosinophils Absolute: 0.4 10*3/uL (ref 0.0–0.7)
Eosinophils Relative: 3 %
HEMATOCRIT: 42.1 % (ref 36.0–46.0)
HEMOGLOBIN: 14.2 g/dL (ref 12.0–15.0)
LYMPHS ABS: 1.6 10*3/uL (ref 0.7–4.0)
LYMPHS PCT: 15 %
MCH: 32.9 pg (ref 26.0–34.0)
MCHC: 33.7 g/dL (ref 30.0–36.0)
MCV: 97.5 fL (ref 78.0–100.0)
MONOS PCT: 7 %
Monocytes Absolute: 0.7 10*3/uL (ref 0.1–1.0)
NEUTROS PCT: 75 %
Neutro Abs: 8 10*3/uL — ABNORMAL HIGH (ref 1.7–7.7)
Platelets: 411 10*3/uL — ABNORMAL HIGH (ref 150–400)
RBC: 4.32 MIL/uL (ref 3.87–5.11)
RDW: 13.6 % (ref 11.5–15.5)
WBC: 10.7 10*3/uL — AB (ref 4.0–10.5)

## 2015-09-11 LAB — URINALYSIS, ROUTINE W REFLEX MICROSCOPIC
Bilirubin Urine: NEGATIVE
GLUCOSE, UA: NEGATIVE mg/dL
Hgb urine dipstick: NEGATIVE
Ketones, ur: NEGATIVE mg/dL
LEUKOCYTES UA: NEGATIVE
Nitrite: NEGATIVE
PROTEIN: NEGATIVE mg/dL
Specific Gravity, Urine: 1.011 (ref 1.005–1.030)
pH: 6.5 (ref 5.0–8.0)

## 2015-09-11 LAB — LACTIC ACID, PLASMA
LACTIC ACID, VENOUS: 1.1 mmol/L (ref 0.5–2.0)
Lactic Acid, Venous: 1.9 mmol/L (ref 0.5–2.0)

## 2015-09-11 LAB — CK: CK TOTAL: 22 U/L — AB (ref 38–234)

## 2015-09-11 LAB — I-STAT TROPONIN, ED: Troponin i, poc: 0 ng/mL (ref 0.00–0.08)

## 2015-09-11 MED ORDER — ONDANSETRON HCL 4 MG PO TABS: 4.0000 mg | ORAL_TABLET | Freq: Four times a day (QID) | ORAL | Status: DC | PRN

## 2015-09-11 MED ORDER — LEVETIRACETAM 250 MG PO TABS
250.0000 mg | ORAL_TABLET | Freq: Two times a day (BID) | ORAL | Status: DC
  Administered 2015-09-12 (×2): 250 mg via ORAL
  Filled 2015-09-11 (×2): qty 1

## 2015-09-11 MED ORDER — DEXTROSE 50 % IV SOLN
INTRAVENOUS | Status: AC
  Administered 2015-09-11: 50 mL
  Filled 2015-09-11: qty 50

## 2015-09-11 MED ORDER — SODIUM CHLORIDE 0.9 % IV SOLN
INTRAVENOUS | Status: DC
  Administered 2015-09-11: 18:00:00 via INTRAVENOUS

## 2015-09-11 MED ORDER — ACETAMINOPHEN 650 MG RE SUPP: 650.0000 mg | Freq: Four times a day (QID) | RECTAL | Status: DC | PRN

## 2015-09-11 MED ORDER — SODIUM CHLORIDE 0.9 % IJ SOLN
3.0000 mL | Freq: Two times a day (BID) | INTRAMUSCULAR | Status: DC
  Administered 2015-09-12 (×2): 3 mL via INTRAVENOUS

## 2015-09-11 MED ORDER — LISINOPRIL 20 MG PO TABS
20.0000 mg | ORAL_TABLET | Freq: Every day | ORAL | Status: DC
  Administered 2015-09-12 – 2015-09-13 (×2): 20 mg via ORAL
  Filled 2015-09-11 (×2): qty 1

## 2015-09-11 MED ORDER — AMLODIPINE BESYLATE 10 MG PO TABS
10.0000 mg | ORAL_TABLET | Freq: Every day | ORAL | Status: DC
  Administered 2015-09-12 – 2015-09-13 (×2): 10 mg via ORAL
  Filled 2015-09-11 (×2): qty 1

## 2015-09-11 MED ORDER — INSULIN ASPART 100 UNIT/ML ~~LOC~~ SOLN
0.0000 [IU] | SUBCUTANEOUS | Status: DC
  Administered 2015-09-11: 2 [IU] via SUBCUTANEOUS
  Administered 2015-09-12: 1 [IU] via SUBCUTANEOUS
  Administered 2015-09-12: 2 [IU] via SUBCUTANEOUS

## 2015-09-11 MED ORDER — CARVEDILOL 6.25 MG PO TABS
6.2500 mg | ORAL_TABLET | Freq: Two times a day (BID) | ORAL | Status: DC
  Administered 2015-09-12 – 2015-09-13 (×4): 6.25 mg via ORAL
  Filled 2015-09-11 (×4): qty 1

## 2015-09-11 MED ORDER — ONDANSETRON HCL 4 MG/2ML IJ SOLN: 4.0000 mg | Freq: Four times a day (QID) | INTRAMUSCULAR | Status: DC | PRN

## 2015-09-11 MED ORDER — PANTOPRAZOLE SODIUM 40 MG PO TBEC
40.0000 mg | DELAYED_RELEASE_TABLET | Freq: Every day | ORAL | Status: DC
  Administered 2015-09-12 – 2015-09-13 (×2): 40 mg via ORAL
  Filled 2015-09-11 (×2): qty 1

## 2015-09-11 MED ORDER — GI COCKTAIL ~~LOC~~: 30.0000 mL | Freq: Once | ORAL | Status: DC

## 2015-09-11 MED ORDER — SODIUM CHLORIDE 0.9 % IV BOLUS (SEPSIS)
1000.0000 mL | Freq: Once | INTRAVENOUS | Status: AC
  Administered 2015-09-11: 1000 mL via INTRAVENOUS

## 2015-09-11 MED ORDER — INSULIN DETEMIR 100 UNIT/ML ~~LOC~~ SOLN
30.0000 [IU] | Freq: Two times a day (BID) | SUBCUTANEOUS | Status: DC
  Administered 2015-09-12: 30 [IU] via SUBCUTANEOUS
  Filled 2015-09-11 (×3): qty 0.3

## 2015-09-11 MED ORDER — LISINOPRIL 20 MG PO TABS: 20.0000 mg | ORAL_TABLET | Freq: Every day | ORAL | Status: DC

## 2015-09-11 MED ORDER — DEXTROSE-NACL 5-0.9 % IV SOLN
INTRAVENOUS | Status: DC
  Administered 2015-09-11 – 2015-09-13 (×4): via INTRAVENOUS

## 2015-09-11 MED ORDER — HYDRALAZINE HCL 20 MG/ML IJ SOLN: 10.0000 mg | Freq: Four times a day (QID) | INTRAMUSCULAR | Status: DC | PRN

## 2015-09-11 MED ORDER — ACETAMINOPHEN 325 MG PO TABS: 650.0000 mg | ORAL_TABLET | Freq: Four times a day (QID) | ORAL | Status: DC | PRN

## 2015-09-11 MED ORDER — FLUOXETINE HCL 20 MG PO CAPS
40.0000 mg | ORAL_CAPSULE | Freq: Every day | ORAL | Status: DC
  Administered 2015-09-12 – 2015-09-13 (×2): 40 mg via ORAL
  Filled 2015-09-11 (×2): qty 2

## 2015-09-11 MED ORDER — DICLOFENAC SODIUM 75 MG PO TBEC
75.0000 mg | DELAYED_RELEASE_TABLET | Freq: Two times a day (BID) | ORAL | Status: DC
  Administered 2015-09-12 – 2015-09-13 (×4): 75 mg via ORAL
  Filled 2015-09-11 (×6): qty 1

## 2015-09-11 MED ORDER — SODIUM CHLORIDE 0.9 % IV SOLN: INTRAVENOUS | Status: AC

## 2015-09-11 NOTE — H&P (Signed)
[facility] History and Physical                                                                                    Margaret Fischer, is a 75 y.o. female  MRN: [id number]   DOB - [birth year]  Admit Date - 09/11/2015  Outpatient Primary MD for the patient is Delia Chimes, NP  Referring MD: [facility] / ER  PMH: Past Medical History  Diagnosis Date  . Diabetes mellitus     Insulin dependent  . Chest pain, non-cardiac     History of 2 normal cardiac catheterizations  . Bradycardia   . Dizziness     Chronic  . Fibromyalgia   . [place]   . Chronic chest pain   . Hyperlipidemia   . Chronic neck pain   . Chronic back pain   . Basal cell carcinoma of nose   . Depression   . Thyroid cyst   . Goiter   . 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 Hypertension   . Depression   . Anxiety   . Migraine       PSH: Past Surgical History  Procedure Laterality Date  . Total knee arthroplasty    . Cardiac catheterization      History of 2 caths, reportedly normal  . Joint replacement    . Rotator cuff repair    . Appendectomy    . Cystectomy    . Thyroid surgery    . Breast lumpectomy    . Abdominal hysterectomy    . Biceps tendon repair    . Refractive surgery    . Right nares  09/2011    basal cell surgery  . Cholecystectomy    . Vesicovaginal fistula closure w/ tah    . Lasik       CC:  Chief Complaint  Patient presents with  . Altered Mental Status     HPI: This is a 75 year old WF PMHx diabetes on insulin and metformin, hypertension, dyslipidemia, depression and anxiety, chronic diastolic heart failure and previous urine drug screens positive for marijuana. Patient was recently discharged on 1/18 after an admission for a fall with resultant small subdural hematoma. She was followed by neurosurgery during the hospitalization with recommendation to follow up with them in 3 weeks post discharge. Hospital course was complicated by acute encephalopathy in setting of  profound hyperglycemia requiring the patient to be intubated. Concerns over possible infectious etiology but all cultures were negative (although tracheal aspirate did show Candida albicans and patient was given Diflucan and nystatin). Physical therapy recommended placement in skilled nursing facility and patient was subsequently discharged to [facility]. Of note the patient was started on Keppra during the last hospitalization for seizure prophylaxis by the neurosurgery team. Patient was sent back to the hospital today because of progressive altered mentation with progressive worsening in her ability to speak and ataxia. Patient's family reported to the EDP that the symptoms have correlated with the patient initiation of Keppra. Per family report patient apparently has not been eating well since discharge. Speech therapy had evaluated the patient during the previous hospitalization and recommended regular thin  liquid diet. No apparent fevers, nausea, vomiting, diarrhea or upper respiratory symptoms or cough since discharge.  ER Evaluation and treatment: Afebrile, vital signs stable, room air saturations between 94 - 97% CT head without contrast: Stable size of the left subdural hematoma since 09/02/15 with 5 mm of rightward midline shift. Blood products are now more isodense, no new intracranial abnormality identified Two-view chest x-ray: No acute process EKG: Sinus rhythm ventricular rate 65 these per minute, QTC 443 ms, subtle J-point elevation in inferolateral leads but no ischemic changes Abnormal labs: Lactic acid 3.93, glucose 111, WBCs 10,700 with neutrophils 75% absolute neutrophils 8% Normal saline IV bolus 1000 mL  Review of Systems   Unable to obtain from patient due to altered mentation and dysarthria but according to family friend who is been communicating with the family patient apparently has not been eating well since arrival to the nursing facility and this correlates with her  progressive altered mentation which the family reported began with the initiation of Keppra-she is unaware of any reported fevers or chills or any other symptomatology  Social History Social History  Substance Use Topics  . Smoking status: Former Smoker    Quit date: 12/25/1983  . Smokeless tobacco: Never Used  . Alcohol Use: No    Resides at: [facility] with: N/A  Ambulatory status: Prior to discharge on 1/18 physical therapy evaluation documented patient ability to mobilize with a rolling walker she did require 2 people to assist from sitting to standing and did require some coaching and prompting to utilize the walker appropriately-today patient was reported as being unable to mobilize or weight-bear   Family History Family History  Problem Relation Age of Onset  . Lymphoma Mother   . Diabetic kidney disease Mother   . Diabetic kidney disease Brother   . Heart disease Sister   . Cancer Sister   . Heart attack Brother   . Myasthenia gravis Brother      Prior to Admission medications   Medication Sig Start Date End Date Taking? Authorizing Provider  amLODipine (NORVASC) 10 MG tablet Take 1 tablet (10 mg total) by mouth daily. 12/08/13  Yes Olam Idler, MD  benzonatate (TESSALON) 100 MG capsule Take 1 capsule (100 mg total) by mouth 3 (three) times daily as needed for cough. 09/07/15  Yes Ripudeep Krystal Eaton, MD  carvedilol (COREG) 6.25 MG tablet Take 1 tablet (6.25 mg total) by mouth 2 (two) times daily with a meal. 09/07/15  Yes Ripudeep K Rai, MD  cholecalciferol (VITAMIN D) 1000 units tablet Take 1,000 Units by mouth daily.   Yes Historical Provider, MD  clonazePAM (KLONOPIN) 0.5 MG tablet Take 1 tablet (0.5 mg total) by mouth 2 (two) times daily. Patient taking differently: Take 0.5 mg by mouth at bedtime.  09/07/15  Yes Ripudeep Krystal Eaton, MD  cyanocobalamin 500 MCG tablet Take 500 mcg by mouth daily.   Yes Historical Provider, MD  diclofenac  (VOLTAREN) 75 MG EC tablet Take 75 mg by mouth 2 (two) times daily.   Yes Historical Provider, MD  fluconazole (DIFLUCAN) 100 MG tablet Take 1 tablet (100 mg total) by mouth daily. X 7days Patient taking differently: Take 100 mg by mouth daily. Started 09/07/15, for 7 days ending 09/13/15 09/07/15  Yes Ripudeep Krystal Eaton, MD  FLUoxetine (PROZAC) 40 MG capsule Take 1 capsule (40 mg total) by mouth daily. 09/07/15  Yes Ripudeep Krystal Eaton, MD  glucosamine-chondroitin 500-400 MG tablet Take 1  tablet by mouth 2 (two) times daily.     Yes Historical Provider, MD  insulin aspart (NOVOLOG) 100 UNIT/ML injection Sliding scale  CBG 70 - 120: 0 units: CBG 121 - 150: 2 units; CBG 151 - 200: 3 units; CBG 201 - 250: 5 units; CBG 251 - 300: 8 units;CBG 301 - 350: 11 units; CBG 351 - 400: 15 units; CBG > 400 : 15 units and notify MD 09/07/15  Yes Ripudeep K Rai, MD  insulin detemir (LEVEMIR) 100 UNIT/ML injection Inject 0.3 mLs (30 Units total) into the skin 2 (two) times daily. 09/01/13  Yes Marianne L York, PA-C  levETIRAcetam (KEPPRA) 500 MG tablet Take 1 tablet (500 mg total) by mouth every 12 (twelve) hours. 09/07/15  Yes Ripudeep Krystal Eaton, MD  lisinopril (PRINIVIL,ZESTRIL) 20 MG tablet Take 1 tablet (20 mg total) by mouth daily. 12/08/13  Yes Olam Idler, MD  metFORMIN (GLUCOPHAGE) 1000 MG tablet Take 1,000 mg by mouth 2 (two) times daily with a meal.   Yes Historical Provider, MD  nystatin (MYCOSTATIN) 100000 UNIT/ML suspension Use as directed 5 mLs (500,000 Units total) in the mouth or throat 4 (four) times daily. 09/07/15  Yes Ripudeep Krystal Eaton, MD  oxyCODONE-acetaminophen (PERCOCET/ROXICET) 5-325 MG tablet Take 1 tablet by mouth every 6 (six) hours as needed for moderate pain. 09/07/15  Yes Ripudeep Krystal Eaton, MD  pantoprazole (PROTONIX) 40 MG tablet Take 1 tablet (40 mg total) by mouth daily. 09/07/15  Yes Ripudeep Krystal Eaton, MD  pravastatin (PRAVACHOL) 40 MG tablet Take 1 tablet (40 mg total) by mouth daily. Patient taking differently:  Take 40 mg by mouth every evening.  09/01/13  Yes Bobby Rumpf York, PA-C  nitroGLYCERIN (NITROSTAT) 0.4 MG SL tablet Place 0.4 mg under the tongue every 5 (five) minutes as needed for chest pain.    Historical Provider, MD    Allergies  Allergen Reactions  . Epinephrine Other (See Comments)    Tachycardia when used as a local anesthetic  . Hydromorphone Other (See Comments)    Unknown reaction  . Latex Rash  . Morphine And Related Other (See Comments)    Unknown reaction  . Phenergan [Promethazine Hcl] Other (See Comments)    " GOES WACK YOU WITH THIS MEDICATION" STATEMENT MADE BY PATIENT  . Succinylcholine Other (See Comments)    Unknown reaction    Physical Exam  Vitals  Blood pressure 135/58, pulse 72, temperature 98.4 F (36.9 C), temperature source Rectal, resp. rate 19, SpO2 98 %.   General:  Appears to be chronically ill and slightly older than stated age  Psych: Anxious and restless, unable to obtain additional history due to patient's dysarthria and inability to participate in interview  Neuro:   No focal neurological deficits, CN II through XII intact except for noted dysarthria, unable to test strength accurately due to patient's inability to participate but she is noticed with spontaneous movement of all extremities and is able to reposition self on stretcher, Sensation intact all 4 extremities.  ENT:  Ears and Eyes appear Normal, Conjunctivae clear, PER. Edentulous, dry oral mucosa without erythema or exudates.  Neck:  Supple, No lymphadenopathy appreciated  Respiratory:  Symmetrical chest wall movement, Good air movement bilaterally, CTAB. Room Air  Cardiac:  RRR, No Murmurs, no LE edema noted, no JVD, No carotid bruits, peripheral pulses palpable at 2+  Abdomen:  Positive bowel sounds, Soft, Non tender, Non distended,  No masses appreciated, no obvious hepatosplenomegaly  Skin:  No  Cyanosis, poor Skin Turgor, No Skin Rash or Bruise.  Extremities: Symmetrical  without obvious trauma or injury,  no effusions.  Data Review  CBC  Recent Labs Lab 09/05/15 0406 09/06/15 0717 09/07/15 0420 09/11/15 1250  WBC 8.2 9.2 9.8 10.7*  HGB 12.9 13.2 13.4 14.2  HCT 39.0 39.5 40.3 42.1  PLT 438* 416* 426* 411*  MCV 95.4 97.3 98.3 97.5  MCH 31.5 32.5 32.7 32.9  MCHC 33.1 33.4 33.3 33.7  RDW 12.5 13.2 13.3 13.6  LYMPHSABS 2.1 2.2 1.9 1.6  MONOABS 0.7 0.9 1.0 0.7  EOSABS 0.3 0.4 0.3 0.4  BASOSABS 0.0 0.0 0.0 0.0    Chemistries   Recent Labs Lab 09/05/15 0406 09/06/15 0717 09/07/15 0420 09/11/15 1250  NA 138 138 137 140  K 4.2 4.0 4.0 4.1  CL 101 103 100* 103  CO2 29 29 28 25   GLUCOSE 295* 212* 220* 111*  BUN 7 10 12 16   CREATININE 0.47 0.47 0.48 0.49  CALCIUM 8.4* 8.7* 8.7* 9.5  MG 2.1 2.0 1.9  --   AST 18 20 21 21   ALT 14 13* 11* 8*  ALKPHOS 97 93 93 96  BILITOT 1.6* 1.2 1.2 0.7    estimated creatinine clearance is 64 mL/min (by C-G formula based on Cr of 0.49).  No results for input(s): TSH, T4TOTAL, T3FREE, THYROIDAB in the last 72 hours.  Invalid input(s): FREET3  Coagulation profile No results for input(s): INR, PROTIME in the last 168 hours.  No results for input(s): DDIMER in the last 72 hours.  Cardiac Enzymes No results for input(s): CKMB, TROPONINI, MYOGLOBIN in the last 168 hours.  Invalid input(s): CK  Invalid input(s): POCBNP  Urinalysis    Component Value Date/Time   COLORURINE YELLOW 09/11/2015 [place] 09/11/2015 1220   LABSPEC 1.011 09/11/2015 1220   PHURINE 6.5 09/11/2015 1220   GLUCOSEU NEGATIVE 09/11/2015 1220   HGBUR NEGATIVE 09/11/2015 1220   BILIRUBINUR NEGATIVE 09/11/2015 1220   KETONESUR NEGATIVE 09/11/2015 1220   PROTEINUR NEGATIVE 09/11/2015 1220   UROBILINOGEN 0.2 12/04/2013 1255   NITRITE NEGATIVE 09/11/2015 1220   LEUKOCYTESUR NEGATIVE 09/11/2015 1220    Imaging results:   Dg Chest 2 View  09/11/2015  CLINICAL DATA:  Altered mental status, recent UTI EXAM:  CHEST  2 VIEW COMPARISON:  08/31/2015 FINDINGS: Cardiomediastinal silhouette is stable. No acute infiltrate or pleural effusion. No pulmonary edema. Mild elevation of the right hemidiaphragm. Mild degenerative changes lower thoracic spine. IMPRESSION: No active cardiopulmonary disease. Electronically Signed   By: Lahoma Crocker M.D.   On: 09/11/2015 13:59   Dg Abd 1 View  08/30/2015  CLINICAL DATA:  Nausea, vomiting. EXAM: ABDOMEN - 1 VIEW COMPARISON:  08/28/2015 FINDINGS: The bowel gas pattern is normal. No radio-opaque calculi or other significant radiographic abnormality are seen. Enteric catheter overlies the expected location of the gastric body. No radiographic evidence of organomegaly.Cholecystectomy clips are noted. Rectal probe is in place. Postsurgical clips overlie the right groin. IMPRESSION: Nonobstructive bowel gas pattern. Enteric catheter with distal tip at the expected location of gastric body. Electronically Signed   By: Fidela Salisbury M.D.   On: 08/30/2015 16:29   Ct Head Wo Contrast  09/11/2015  CLINICAL DATA:  75 year old female with altered mental status (not otherwise specified at the time of this report). Recent left subdural hematoma. Initial encounter. EXAM: CT HEAD WITHOUT CONTRAST TECHNIQUE: Contiguous axial images were obtained from the base of the skull through the vertex without intravenous  contrast. COMPARISON:  09/02/2015 and earlier FINDINGS: Interval improved sphenoid sinus aeration with resolved bubbly opacity. Otherwise stable and largely clear paranasal sinuses, chronic appearing left orbital floor fracture. No acute osseous abnormality identified. Stable orbit and scalp soft tissues. Mixed density left subdural hematoma, more isodense than on 09/02/2015. Hematoma thickness up to 9-10 mm today, stable. Rightward midline shift of 5 mm also is stable. Mild effacement of the left lateral ventricle is stable. No ventriculomegaly. Basilar cisterns remain patent. No new  intracranial hemorrhage identified. No cortically based acute infarct identified. Stable gray-white matter differentiation. IMPRESSION: 1. Stable size of the left subdural hematoma since 09/02/2015 with 5 mm of rightward midline shift. Blood products now are more isodense. 2. No new intracranial abnormality identified. Electronically Signed   By: Genevie Ann M.D.   On: 09/11/2015 12:42   Ct Head Wo Contrast  09/02/2015  CLINICAL DATA:  Followup subdural hematoma. EXAM: CT HEAD WITHOUT CONTRAST TECHNIQUE: Contiguous axial images were obtained from the base of the skull through the vertex without intravenous contrast. COMPARISON:  Prior study from 08/28/2015. FINDINGS: Left holo hemispheric subdural hematoma again seen. Hematoma measures up to 1 cm in maximal thickness, decreased from prior. Small amount of hemorrhage extends along the falx as well. 4-5 mm of left-to-right shift. Mild edema with sulcal effacement within the left cerebral hemisphere. No hydrocephalus or evidence of ventricular trapping. No intraventricular blood are definite subarachnoid hemorrhage. No acute large vessel territory infarct. Atrophy with chronic small vessel ischemic disease again noted. Vascular calcifications within the carotid siphons. Left parietal scalp contusion present. No acute abnormality about the orbits. Exophthalmos is stable. Left orbital floor fracture noted, better evaluated on prior maxillofacial CT. Increase mucosal thickening throughout the ethmoidal air cells and sphenoid sinuses as compared to previous. No mastoid effusion. Calvarium intact. IMPRESSION: 1. Slightly decreased size of left subdural hematoma, now measuring up to 1 cm in maximal thickness. 2. 4-5 mm of left-to-right shift. No hydrocephalus or evidence of ventricular trapping. 3. No other new intracranial process. Electronically Signed   By: Jeannine Boga M.D.   On: 09/02/2015 06:57   Ct Head Wo Contrast  08/29/2015  CLINICAL DATA:  Follow-up LEFT  subdural hematoma, altered mental status. Struck head yesterday. EXAM: CT HEAD WITHOUT CONTRAST TECHNIQUE: Contiguous axial images were obtained from the base of the skull through the vertex without intravenous contrast. COMPARISON:  CT head August 28, 2015 FINDINGS: LEFT holo hemispheric dense acute subdural hematoma measuring up to 12 mm with resultant 2 mm LEFT-to-RIGHT midline shift. 2 mm LEFT parafalcine subdural hematoma with trace subarachnoid hemorrhage along the interhemispheric fissure, RIGHT frontal sulci. No ventricular entrapment or hydrocephalus. No intraparenchymal hemorrhage. No acute large vascular territory infarct. Basal cisterns are patent. Moderate calcific atherosclerosis of the carotid siphons. Small LEFT maxillary sinus air-fluid level. Suspected LEFT orbital floor fracture may be acute. Mastoid air cells are well aerated. No skull fracture. IMPRESSION: Similar appearance of acute 12 mm holo hemispheric subdural hematoma resulting in 2 mm LEFT-to-RIGHT midline shift. 2 mm parafalcine subdural hematoma with small amount of subarachnoid hemorrhage. Suspected LEFT orbital floor fracture may be acute. Recommend dedicated maxillofacial CT. Electronically Signed   By: Elon Alas M.D.   On: 08/29/2015 05:03   Ct Head Wo Contrast  08/28/2015  CLINICAL DATA:  Followup subdural hematoma. EXAM: CT HEAD WITHOUT CONTRAST TECHNIQUE: Contiguous axial images were obtained from the base of the skull through the vertex without intravenous contrast. COMPARISON:  03/11/2014 FINDINGS: There is a  large left subdural hematoma measuring 13 mm in maximum thickness, image 17 of series 201. This extends anteriorly over the left frontal lobe with a parafalcine component measuring 3 mm in thickness. The ventricular volumes appear normal. No hydrocephalus or intracranial hemorrhage. There is mild left-to-right midline shift of 2 mm. Prominence of the sulci identified compatible with brain atrophy. There is mild  diffuse low attenuation within the subcortical and periventricular white matter compatible with chronic microvascular disease.Fluid level is noted within the left maxillary sinus. The mastoid air cells are clear. The calvarium is intact. IMPRESSION: 1. Acute left subdural hematoma measuring 13 mm and causing approximately 2 mm of left-to-right midline shift. 2. Small vessel ischemic change and brain atrophy. Electronically Signed   By: Kerby Moors M.D.   On: 08/28/2015 15:11   Ct Head Wo Contrast  08/28/2015  CLINICAL DATA:  75 year old female with altered mental status and agitated EXAM: CT HEAD WITHOUT CONTRAST TECHNIQUE: Contiguous axial images were obtained from the base of the skull through the vertex without intravenous contrast. COMPARISON:  Prior head CT 03/11/2014 FINDINGS: Acute crescentic extra-axial high attenuation fluid overlying the left cerebral convexity consistent with acute subdural hematoma. The maximal depth at the mid temporal lobe is approximately 13 mm. Additionally, there is likely some focal subdural blood overlying the right frontal lobe as well although this is in a region of combined streak and beam hardening artifact. No evidence of mass, mass effect, hydrocephalus or acute infarct. No intraventricular or subarachnoid hemorrhage visualized. Approximately 4 mm left-to-right midline shift. Motion artifact slightly limits evaluation for subtle calvarial fracture. No definite displaced calvarial fracture identified. Grossly normally aerated mastoid air cells and paranasal sinuses save for a small amount of hemo sinus layering in the left maxillary sinus. Incompletely imaged soft tissue contusion overlying the left maxillary antrum. There is slight irregularity of the inferior wall of the left orbit. Fracture is not excluded. IMPRESSION: 1. Acute subdural hematoma overlying the left cerebral convex [place] measuring up to 13 mm in width. There is associated local mass effect with 4 mm  left-to-right midline shift. 2. Additional small volume subdural hematoma versus artifact overlying the right frontal lobe. 3. Incompletely imaged soft tissue contusion overlying the left maxillary antrum with associated small volume left maxillary hemo sinus. 4. Question of possible left orbital floor fracture which is incompletely imaged. Recommend further evaluation with dedicated CT scan of the face when the patient is able to cooperate and there is less potential for severe motion artifact. Critical Value/emergent results were called by telephone at the time of interpretation on 08/28/2015 at 11:55 am to Dr. Pattricia Boss , who verbally acknowledged these results. Electronically Signed   By: Jacqulynn Cadet M.D.   On: 08/28/2015 11:56   Dg Chest Port 1 View  08/31/2015  CLINICAL DATA:  Respiratory failure EXAM: PORTABLE CHEST 1 VIEW COMPARISON:  08/30/2015 FINDINGS: Endotracheal tube tip 5.3 cm above the carina. Nasogastric tube enters the stomach. Borderline enlargement of the cardiopericardial silhouette with atherosclerotic calcification of the aortic arch. Blunted left costophrenic angle. Improved aeration at the right lung base. IMPRESSION: 1. Blunting of the left costophrenic angle-left pleural effusion not excluded. 2. Improved aeration at the right lung base. 3. Otherwise stable. Electronically Signed   By: Van Clines M.D.   On: 08/31/2015 07:17   Dg Chest Port 1 View  08/30/2015  CLINICAL DATA:  75 year old female currently intubated EXAM: PORTABLE CHEST 1 VIEW COMPARISON:  Prior chest x-ray 08/29/2015 FINDINGS: The patient  is intubated. The tip of the endotracheal tube is 4.8 cm above the carina. A nasogastric tube is present, the tip lies below the diaphragm off the field of view but likely within the stomach. Inspiratory volumes remain low. Improved aeration of the left lung base likely secondary to decreasing atelectasis. Persistent patchy airspace opacity in the right lung base. No  large pleural effusion or pneumothorax. No suspicious nodule or mass. No acute osseous abnormality. IMPRESSION: 1. Stable and satisfactory support apparatus. 2. Decreasing atelectasis in the left lung base. 3. Persistent patchy airspace opacity in the right lung base which may reflect atelectasis or infiltrate. Electronically Signed   By: Jacqulynn Cadet M.D.   On: 08/30/2015 07:46   Dg Chest Port 1 View  08/29/2015  CLINICAL DATA:  75 year old female with respiratory failure. Subsequent encounter. EXAM: PORTABLE CHEST 1 VIEW COMPARISON:  08/28/2015. FINDINGS: Endotracheal tube tip 4 cm above the carina. Nasogastric tube courses below the diaphragm. Tip is not included on the present exam. Cardiomegaly. Pulmonary vascular prominence most notable centrally. Basilar subsegmental atelectasis with poor inspiration. Basilar infiltrate secondary less likely consideration. No gross pneumothorax. Calcified aorta is slightly tortuous. IMPRESSION: Poor inspiratory portable exam with pulmonary vascular prominence most notable centrally. Basilar subsegmental atelectasis. Nasogastric tube courses below the diaphragm. Tip is not included on the present exam. Electronically Signed   By: Genia Del M.D.   On: 08/29/2015 08:05   Dg Chest Portable 1 View  08/28/2015  CLINICAL DATA:  Patient status post intubation. Loss of consciousness. EXAM: PORTABLE CHEST 1 VIEW COMPARISON:  Chest radiograph 01/27/2015. FINDINGS: ET tube terminates in the mid trachea. Stable cardiomegaly. Minimal heterogeneous opacities left lung base. No pleural effusion or pneumothorax. Old left lateral rib fracture. IMPRESSION: ET tube terminates in mid trachea. Probable left basilar atelectasis. Electronically Signed   By: Lovey Newcomer M.D.   On: 08/28/2015 10:39   Dg Abd Portable 1v  08/28/2015  CLINICAL DATA:  Encounter for orogastric (OG) tube placement EXAM: PORTABLE ABDOMEN - 1 VIEW COMPARISON:  None. FINDINGS: OG tube in the left upper quadrant.  Its position suggests it lies along the greater curvature with tip in the distal body. IMPRESSION: OG tube as described. Electronically Signed   By: Skipper Cliche M.D.   On: 08/28/2015 19:23   Dg Hip Unilat With Pelvis 2-3 Views Left  09/03/2015  CLINICAL DATA:  Pain following fall 1 week prior EXAM: DG HIP (WITH OR WITHOUT PELVIS) 2-3V LEFT COMPARISON:  None. FINDINGS: Frontal pelvis as well as frontal and lateral left hip images were obtained. Bones are diffusely osteoporotic. There is no demonstrable fracture or dislocation. There is moderate symmetric narrowing of both hip joints. No erosive change. There are surgical clips in the right inguinal region. IMPRESSION: Moderate symmetric narrowing of both hip joints. Diffuse osteoporosis. No acute fracture or dislocation. Electronically Signed   By: Lowella Grip III M.D.   On: 09/03/2015 19:12   Ct Maxillofacial Wo Cm  08/29/2015  CLINICAL DATA:  Patient came in yesterday with AMS, SDH Scan showed possible left orbital fracture. Left eye is swollen and bruised EXAM: CT MAXILLOFACIAL WITHOUT CONTRAST TECHNIQUE: Multidetector CT imaging of the maxillofacial structures was performed. Multiplanar CT image reconstructions were also generated. A small metallic BB was placed on the right temple in order to reliably differentiate right from left. COMPARISON:  Head CT, 08/29/2015 at 4:45 a.m. FINDINGS: There is a depressed comminuted fracture of the left orbital floor. Fracture is depressed 9 mm. Oral  fat extends through the defect. There is no entrapment of the inferior rectus muscle. There is mild left lateral periorbital soft tissue edema. Patient has had a previous left cataract surgery. Left globe otherwise unremarkable. No abnormality of the postseptal left orbit. There are no other fractures. Dependent fluid is seen in the left maxillary sinus. Sinuses otherwise clear. Clear mastoid air cells and middle ear cavities. There are indwelling nasogastric and  endotracheal tubes. No neck masses or adenopathy. IMPRESSION: 1. Depressed, comminuted left orbital floor fracture associated with dependent hemorrhage in the left maxillary sinus. No extraocular muscle entrapment. No other fractures. Electronically Signed   By: Lajean Manes M.D.   On: 08/29/2015 13:51     EKG: (Independently reviewed) Sinus rhythm ventricular rate 65 these per minute, QTC 443 ms, subtle J-point elevation in inferolateral leads but no ischemic changes   Assessment & Plan  Active Problems:   Acute encephalopathy in setting of recent Subdural hematoma -CT without evidence of progressing and subdural hematoma suspect likely metabolic in nature -Tele/Obs -Suspect Keppra contributing so we'll decrease from 500 mg twice a day to 250 mg twice a day and monitor for response-if remains sedated suggest weaning further; if need to discontinue this medication may need to discuss with neurosurgery since was ordered for seizure prophylaxis post head injury -Urinalysis unremarkable not consistent with UTI   -No focal neurological deficits other than noted dysarthria/expressive aphasia (see below)    Dysarthria -With a component of expressive aphasia and noting patient currently not verbally responding but does make some eye contact -CT head negative for expansion of subdural -Check MRI brain to rule out atypical presentation of ischemic stroke-if positive for stroke will need to consult neurology [facility] stroke order set -Speech therapy evaluation in am -RN stroke swallow screen    Elevated lactic acid level -During most recent hospitalization patient had a peak lactic acid of 4.35 on 1/8 and after hydration in setting of hyperglycemia decreased to 2.5 but had not been repeated after 1/8 -Presents today with lactic acid 3.93 -Likely explained by volume depletion and poor oral intake since discharge -Patient on statin and metformin so will check CK to rule out lactic acidosis secondary  to medications-will hold these medications until CK resulted -IV fluid hydration for now -Follow lactic acid    HTN  -Blood pressure currently controlled -If patient able to swallow safely will continue Norvasc and carvedilol as well as lisinopril -prn IV Apresoline    Diabetes mellitus type 2, Uncontrolled  -Continue Levemir and sliding scale insulin -Hold metformin as above -Current CBGs well-controlled but over the long-term diabetes has been poorly controlled noting at last admission hemoglobin A1c was 11.1    Thrombocytosis  -Issue since last admission and may be reflective of volume depletion and hemoconcentration    HLD  -Lipid panel last admission with excellent readings although LDL was greater than 70 -Statin on hold as above    Drug abuse, marijuana -Noted on urine drug screen last admission and patient was counseled prior to discharge regarding need for cessation    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 ANP on 09/11/2015 at 4:39 PM  You may contact me by going to [email or web address] - password [email or web address]  I am available from 7a-7p but please confirm I am on the schedule by going to Amion as above.   After 7p please contact night coverage person covering me after hours  [place] during the described time interval was provided by me . I have reviewed this patient's available data, including medical history, events of note, physical examination, and all test results as part of my evaluation. I have personally reviewed and interpreted all radiology studies.  I have discussed the A&P with NP ELLIS,ALLISON and agree with above plan.

## 2015-09-11 NOTE — ED Notes (Signed)
Pt will be transported to floor after MRI.

## 2015-09-11 NOTE — ED Notes (Signed)
Pt arrives from [facility] via [facility]. Pt d/c from [facility] to their care 4 days ago with dx of encephalopathy and altered mental status. Pt was sent to [facility] for altered mental status.

## 2015-09-11 NOTE — ED Provider Notes (Signed)
CSN: [id number]     Arrival date & time 09/11/15  1102 History   First MD Initiated Contact with Patient 09/11/15 1102     Chief Complaint  Patient presents with  . Altered Mental Status     (Consider location/radiation/quality/duration/timing/severity/associated sxs/prior Treatment) HPI Margaret Fischer is a 75 y.o. female with multiple medical problems including diabetes, hypertension, depression, presents to emergency department with altered mental status. Patient was admitted 2 weeks ago for acute encephalopathy, respiratory failure, and subdural bleed from a fall. Upon presenting to emergency department at time she was intubated. She was discharged just 4 days ago, with improved mental status, to a skilled nursing facility. According to the RN who takes care of the patient at the facility, whom I spoke with, patient has had decline in her mental status today. Patient unable to communicate or speak like she normally would. She seems to be having slurred speech and difficulty finding words. They deny new injuries or falls. They also state patient unable to get out of bed today. Denies patient having any recent fever. No nausea or vomiting. Patient does not have any URI symptoms. Patient does have history of UTI with recent treatment. Family states that patient has had actually gradual decline in mental status since being at the rehabilitation, with worsening symptoms today. During the end of admission, patient apparently was able to speak and walk freely with no difficulty.=  Past Medical History  Diagnosis Date  . Diabetes mellitus     Insulin dependent  . Chest pain, non-cardiac     History of 2 normal cardiac catheterizations  . Bradycardia   . Dizziness     Chronic  . Fibromyalgia   . Neuropathy ([place])   . Chronic chest pain   . Hyperlipidemia   . Chronic neck pain   . Chronic back pain   . Basal cell carcinoma of nose   . Depression   . Thyroid cyst   . Goiter   . 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 Hypertension   . Depression   . Anxiety   . Migraine    Past Surgical History  Procedure Laterality Date  . Total knee arthroplasty    . Cardiac catheterization      History of 2 caths, reportedly normal  . Joint replacement    . Rotator cuff repair    . Appendectomy    . Cystectomy    . Thyroid surgery    . Breast lumpectomy    . Abdominal hysterectomy    . Biceps tendon repair    . Refractive surgery    . Right nares  09/2011    basal cell surgery  . Cholecystectomy    . Vesicovaginal fistula closure w/ tah    . Lasik     Family History  Problem Relation Age of Onset  . Lymphoma Mother   . Diabetic kidney disease Mother   . Diabetic kidney disease Brother   . Heart disease Sister   . Cancer Sister   . Heart attack Brother   . Myasthenia gravis Brother    Social History  Substance Use Topics  . Smoking status: Former Smoker    Quit date: 12/25/1983  . Smokeless tobacco: Never Used  . Alcohol Use: No   OB History    No data available     Review of Systems  Unable to perform ROS: Mental status change  Neurological: Positive for speech difficulty and weakness.  All other systems reviewed and are negative.     Allergies  Epinephrine; Hydromorphone; Morphine and related; Phenergan; Succinylcholine; and Latex  Home Medications   Prior to Admission medications   Medication Sig Start Date End Date Taking? Authorizing Provider  amLODipine (NORVASC) 10 MG tablet Take 1 tablet (10 mg total) by mouth daily. 12/08/13   Olam Idler, MD  benzonatate (TESSALON) 100 MG capsule Take 1 capsule (100 mg total) by mouth 3 (three) times daily as needed for cough. 09/07/15   Ripudeep Krystal Eaton, MD  carvedilol (COREG) 6.25 MG tablet Take 1 tablet (6.25 mg total) by mouth 2 (two) times daily with a meal. 09/07/15   Ripudeep Krystal Eaton, MD  Cholecalciferol (VITAMIN D-3 PO) Take by mouth daily.      Historical Provider, MD  clonazePAM (KLONOPIN) 0.5 MG tablet  Take 1 tablet (0.5 mg total) by mouth 2 (two) times daily. 09/07/15   Ripudeep Krystal Eaton, MD  Cyanocobalamin (B-12 PO) Take 1 tablet by mouth daily.    Historical Provider, MD  diclofenac (VOLTAREN) 75 MG EC tablet Take 75 mg by mouth 2 (two) times daily.    Historical Provider, MD  fluconazole (DIFLUCAN) 100 MG tablet Take 1 tablet (100 mg total) by mouth daily. X 7days 09/07/15   Ripudeep Krystal Eaton, MD  FLUoxetine (PROZAC) 40 MG capsule Take 1 capsule (40 mg total) by mouth daily. 09/07/15   Ripudeep Krystal Eaton, MD  glucosamine-chondroitin 500-400 MG tablet Take 1 tablet by mouth 2 (two) times daily.      Historical Provider, MD  insulin aspart (NOVOLOG) 100 UNIT/ML injection Sliding scale  CBG 70 - 120: 0 units: CBG 121 - 150: 2 units; CBG 151 - 200: 3 units; CBG 201 - 250: 5 units; CBG 251 - 300: 8 units;CBG 301 - 350: 11 units; CBG 351 - 400: 15 units; CBG > 400 : 15 units and notify MD 09/07/15   Ripudeep Krystal Eaton, MD  insulin detemir (LEVEMIR) 100 UNIT/ML injection Inject 0.3 mLs (30 Units total) into the skin 2 (two) times daily. 09/01/13   Bobby Rumpf York, PA-C  levETIRAcetam (KEPPRA) 500 MG tablet Take 1 tablet (500 mg total) by mouth every 12 (twelve) hours. 09/07/15   Ripudeep Krystal Eaton, MD  lisinopril (PRINIVIL,ZESTRIL) 20 MG tablet Take 1 tablet (20 mg total) by mouth daily. 12/08/13   Olam Idler, MD  metFORMIN (GLUCOPHAGE) 1000 MG tablet Take 1,000 mg by mouth 2 (two) times daily with a meal.    Historical Provider, MD  nitroGLYCERIN (NITROSTAT) 0.4 MG SL tablet Place 0.4 mg under the tongue every 5 (five) minutes as needed for chest pain.    Historical Provider, MD  nystatin (MYCOSTATIN) 100000 UNIT/ML suspension Use as directed 5 mLs (500,000 Units total) in the mouth or throat 4 (four) times daily. 09/07/15   Ripudeep Krystal Eaton, MD  oxyCODONE-acetaminophen (PERCOCET/ROXICET) 5-325 MG tablet Take 1 tablet by mouth every 6 (six) hours as needed for moderate pain. 09/07/15   Ripudeep Krystal Eaton, MD  pantoprazole  (PROTONIX) 40 MG tablet Take 1 tablet (40 mg total) by mouth daily. 09/07/15   Ripudeep Krystal Eaton, MD  pravastatin (PRAVACHOL) 40 MG tablet Take 1 tablet (40 mg total) by mouth daily. 09/01/13   Marianne L York, PA-C   BP 137/49 mmHg  Pulse 64  Temp(Src) 98.4 F (36.9 C) (Rectal)  Resp 16  SpO2 97% Physical Exam  Constitutional: She appears well-developed and well-nourished. No distress.  HENT:  Head: Normocephalic.  Eyes: Conjunctivae and EOM are normal. Pupils are equal, round, and reactive to light.  Neck: Neck supple.  Cardiovascular: Normal rate, regular rhythm and normal heart sounds.   Pulmonary/Chest: Effort normal and breath sounds normal. No respiratory distress. She has no wheezes. She has no rales.  Abdominal: Soft. Bowel sounds are normal. She exhibits no distension. There is no tenderness. There is no rebound.  Musculoskeletal: She exhibits no edema.  Neurological: She is alert. No cranial nerve deficit. Coordination normal.  Oriented x2 self and place. Slurred speech with difficulty word finding. 5/5 and equal upper and lower extremity strength bilaterally. Equal grip strength bilaterally.  No pronator drift.   Skin: Skin is warm and dry.  Psychiatric: She has a normal mood and affect. Her behavior is normal.  Nursing note and vitals reviewed.   ED Course  Procedures (including critical care time) Labs Review Labs Reviewed  CBC WITH DIFFERENTIAL/PLATELET - Abnormal; Notable for the following:    WBC 10.7 (*)    Platelets 411 (*)    Neutro Abs 8.0 (*)    All other components within normal limits  COMPREHENSIVE METABOLIC PANEL - Abnormal; Notable for the following:    Glucose, Bld 111 (*)    Total Protein 5.7 (*)    Albumin 3.0 (*)    ALT 8 (*)    All other components within normal limits  I-STAT CG4 LACTIC ACID, ED - Abnormal; Notable for the following:    Lactic Acid, Venous 3.93 (*)    All other components within normal limits  URINALYSIS, ROUTINE W REFLEX  MICROSCOPIC (NOT AT [facility])  Randolm Idol, ED    Imaging Review Dg Chest 2 View  09/11/2015  CLINICAL DATA:  Altered mental status, recent UTI EXAM: CHEST  2 VIEW COMPARISON:  08/31/2015 FINDINGS: Cardiomediastinal silhouette is stable. No acute infiltrate or pleural effusion. No pulmonary edema. Mild elevation of the right hemidiaphragm. Mild degenerative changes lower thoracic spine. IMPRESSION: No active cardiopulmonary disease. Electronically Signed   By: Lahoma Crocker M.D.   On: 09/11/2015 13:59   Ct Head Wo Contrast  09/11/2015  CLINICAL DATA:  75 year old female with altered mental status (not otherwise specified at the time of this report). Recent left subdural hematoma. Initial encounter. EXAM: CT HEAD WITHOUT CONTRAST TECHNIQUE: Contiguous axial images were obtained from the base of the skull through the vertex without intravenous contrast. COMPARISON:  09/02/2015 and earlier FINDINGS: Interval improved sphenoid sinus aeration with resolved bubbly opacity. Otherwise stable and largely clear paranasal sinuses, chronic appearing left orbital floor fracture. No acute osseous abnormality identified. Stable orbit and scalp soft tissues. Mixed density left subdural hematoma, more isodense than on 09/02/2015. Hematoma thickness up to 9-10 mm today, stable. Rightward midline shift of 5 mm also is stable. Mild effacement of the left lateral ventricle is stable. No ventriculomegaly. Basilar cisterns remain patent. No new intracranial hemorrhage identified. No cortically based acute infarct identified. Stable gray-white matter differentiation. IMPRESSION: 1. Stable size of the left subdural hematoma since 09/02/2015 with 5 mm of rightward midline shift. Blood products now are more isodense. 2. No new intracranial abnormality identified. Electronically Signed   By: Genevie Ann M.D.   On: 09/11/2015 12:42   I have personally reviewed and evaluated these images and lab results as part of my medical  decision-making.   EKG Interpretation   Date/Time:  Sunday September 11 2015 11:18:33 EST Ventricular Rate:  65 PR Interval:  172 QRS Duration: 101 QT Interval:  426 QTC Calculation: 443 R Axis:   18 Text Interpretation:  Sinus rhythm Minimal ST depression, lateral leads No  significant change since last tracing Confirmed by LITTLE MD, RACHEL  [phone]) on 09/11/2015 1:15:26 PM      MDM   Final diagnoses:  Altered mental status, unspecified altered mental status type  Dysphasia  Lactic acid acidosis   Pt with worsening weakness, speech difficulty, coming from skilled nursing facility. Recent subdural hematoma, admission for acute encephalopathy respiratory failure. At this time patient is not having difficulty breathing. She is alert, she is oriented to self and place, she is having trouble speaking however its very hard to understand her. Will repeat CT had and get lab work.  CT of the head is unchanged. Lab work with elevation of lactic acid, otherwise unremarkable. Patient is afebrile, she is not tachycardic, she is not hypotensive. There is no evidence of infection. Do not think she is septic. Given IV fluids. Discussed patient with family and updated on results. Will admit for further evaluation and treatment. Patient may need MRI of the brain.  Filed Vitals:   09/11/15 1400 09/11/15 1415 09/11/15 1430 09/11/15 1500  BP: 126/64 143/53 137/49 135/58  Pulse: 60 65 64 72  Temp:      TempSrc:      Resp: 14 18 16 19   SpO2: 98% 99% 97% 98%     Jeannett Senior, PA-C 09/11/15 [place], PA-C 09/11/15 [place], MD 09/11/15 [phone]

## 2015-09-11 NOTE — ED Notes (Signed)
Patient transported to CT 

## 2015-09-11 NOTE — ED Notes (Signed)
Patient transported to MRI 

## 2015-09-11 NOTE — ED Notes (Signed)
Patient transported to X-ray 

## 2015-09-12 ENCOUNTER — Observation Stay (HOSPITAL_COMMUNITY): Payer: Medicare Other

## 2015-09-12 DIAGNOSIS — E872 Acidosis, unspecified: Secondary | ICD-10-CM | POA: Insufficient documentation

## 2015-09-12 DIAGNOSIS — R400 Somnolence: Secondary | ICD-10-CM | POA: Diagnosis not present

## 2015-09-12 DIAGNOSIS — G9340 Encephalopathy, unspecified: Secondary | ICD-10-CM | POA: Diagnosis not present

## 2015-09-12 DIAGNOSIS — Z794 Long term (current) use of insulin: Secondary | ICD-10-CM

## 2015-09-12 DIAGNOSIS — E1165 Type 2 diabetes mellitus with hyperglycemia: Secondary | ICD-10-CM

## 2015-09-12 DIAGNOSIS — R4702 Dysphasia: Secondary | ICD-10-CM | POA: Diagnosis not present

## 2015-09-12 DIAGNOSIS — R471 Dysarthria and anarthria: Secondary | ICD-10-CM | POA: Diagnosis not present

## 2015-09-12 DIAGNOSIS — F1210 Cannabis abuse, uncomplicated: Secondary | ICD-10-CM

## 2015-09-12 DIAGNOSIS — G92 Toxic encephalopathy: Secondary | ICD-10-CM | POA: Diagnosis not present

## 2015-09-12 DIAGNOSIS — R4182 Altered mental status, unspecified: Secondary | ICD-10-CM | POA: Diagnosis not present

## 2015-09-12 DIAGNOSIS — I6200 Nontraumatic subdural hemorrhage, unspecified: Secondary | ICD-10-CM | POA: Diagnosis not present

## 2015-09-12 LAB — BASIC METABOLIC PANEL
Anion gap: 11 (ref 5–15)
BUN: 9 mg/dL (ref 6–20)
CALCIUM: 9 mg/dL (ref 8.9–10.3)
CO2: 23 mmol/L (ref 22–32)
CREATININE: 0.43 mg/dL — AB (ref 0.44–1.00)
Chloride: 108 mmol/L (ref 101–111)
GFR calc Af Amer: >60 mL/min (ref >60)
GLUCOSE: 81 mg/dL (ref 65–99)
Potassium: 3 mmol/L — ABNORMAL LOW (ref 3.5–5.1)
Sodium: 142 mmol/L (ref 135–145)

## 2015-09-12 LAB — CBC
HCT: 42.9 % (ref 36.0–46.0)
HEMOGLOBIN: 14.7 g/dL (ref 12.0–15.0)
MCH: 32.9 pg (ref 26.0–34.0)
MCHC: 34.3 g/dL (ref 30.0–36.0)
MCV: 96 fL (ref 78.0–100.0)
Platelets: 450 10*3/uL — ABNORMAL HIGH (ref 150–400)
RBC: 4.47 MIL/uL (ref 3.87–5.11)
RDW: 13.4 % (ref 11.5–15.5)
WBC: 9.3 10*3/uL (ref 4.0–10.5)

## 2015-09-12 LAB — IRON AND TIBC
IRON: 104 ug/dL (ref 28–170)
Saturation Ratios: 35 % — ABNORMAL HIGH (ref 10.4–31.8)
TIBC: 300 ug/dL (ref 250–450)
UIBC: 196 ug/dL

## 2015-09-12 LAB — GLUCOSE, CAPILLARY
GLUCOSE-CAPILLARY: 167 mg/dL — AB (ref 65–99)
GLUCOSE-CAPILLARY: 305 mg/dL — AB (ref 65–99)
Glucose-Capillary: 182 mg/dL — ABNORMAL HIGH (ref 65–99)
Glucose-Capillary: 148 mg/dL — ABNORMAL HIGH (ref 65–99)
Glucose-Capillary: 47 mg/dL — ABNORMAL LOW (ref 65–99)
Glucose-Capillary: 50 mg/dL — ABNORMAL LOW (ref 65–99)
Glucose-Capillary: 65 mg/dL (ref 65–99)
Glucose-Capillary: 270 mg/dL — ABNORMAL HIGH (ref 65–99)

## 2015-09-12 LAB — AMMONIA: Ammonia: 30 umol/L (ref 9–35)

## 2015-09-12 LAB — VITAMIN B12: VITAMIN B 12: 735 pg/mL (ref 180–914)

## 2015-09-12 LAB — FERRITIN: Ferritin: 249 ng/mL (ref 11–307)

## 2015-09-12 LAB — MAGNESIUM: MAGNESIUM: 1.7 mg/dL (ref 1.7–2.4)

## 2015-09-12 LAB — TSH: TSH: 0.809 u[IU]/mL (ref 0.350–4.500)

## 2015-09-12 MED ORDER — INSULIN ASPART 100 UNIT/ML ~~LOC~~ SOLN
0.0000 [IU] | Freq: Three times a day (TID) | SUBCUTANEOUS | Status: DC
  Administered 2015-09-12 – 2015-09-13 (×3): 5 [IU] via SUBCUTANEOUS

## 2015-09-12 MED ORDER — INSULIN DETEMIR 100 UNIT/ML ~~LOC~~ SOLN
5.0000 [IU] | Freq: Two times a day (BID) | SUBCUTANEOUS | Status: DC
  Administered 2015-09-12 – 2015-09-13 (×2): 5 [IU] via SUBCUTANEOUS
  Filled 2015-09-12 (×4): qty 0.05

## 2015-09-12 NOTE — Care Management Obs Status (Signed)
[place] NOTIFICATION   Patient Details  Name: Margaret Fischer MRN: [id number] Date of Birth: 05-09-1941   Medicare Observation Status Notification Given:  Yes (Patient unable to sign, letter read to patient and original left in room)    Carles Collet, RN 09/12/2015, 2:25 PM

## 2015-09-12 NOTE — Consult Note (Signed)
NEURO HOSPITALIST CONSULT NOTE   Requestig physician: Dr. Carles Collet   Reason for Consult: lethargy while on Keppra  HPI:                                                                                                                                          Margaret Fischer is an 75 y.o. female with a history of diabetes mellitus, hypertension, hyperlipidemia, chronic back pain who was recently discharged from the hospital after an 11 day stay on 09/07/2015. During the hospital stay, the patient was treated for left-sided subdural hematoma resulting in a mechanical fall. In addition, the patient also had respiratory failure requiring intubation during that hospitalization. She was evaluated by speech therapy prior to discharge and was placed on a regular diet. He was also noted that the patient was able to phonate although she had a very soft voice. The patient was discharged to a skilled nursing facility. Apparently there was concern for some encephalopathy as well as increasing dysphasia at the nursing facility. As a result, the patient was brought to the hospital for further evaluation. Evaluation in the emergency department including CT of the brain revealed a stable left subdural hematoma. Due to the lethargy which appeared to be with her [place] neurology was consulted.   On consult she is very drowsy and encephalopathic. No clinical seizure is noted and she is able to follow simple commands.   Past Medical History  Diagnosis Date  . Diabetes mellitus     Insulin dependent  . Chest pain, non-cardiac     History of 2 normal cardiac catheterizations  . Bradycardia   . Dizziness     Chronic  . Fibromyalgia   . Neuropathy ([place])   . Chronic chest pain   . Hyperlipidemia   . Chronic neck pain   . Chronic back pain   . Basal cell carcinoma of nose   . Depression   . Thyroid cyst   . Goiter   . 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 Hypertension   . Depression   . Anxiety    . Migraine     Past Surgical History  Procedure Laterality Date  . Total knee arthroplasty    . Cardiac catheterization      History of 2 caths, reportedly normal  . Joint replacement    . Rotator cuff repair    . Appendectomy    . Cystectomy    . Rotator cuff repair    . Breast lumpectomy    . Abdominal hysterectomy    . Biceps tendon repair    . Refractive surgery    . Right nares  09/2011    basal cell surgery  . Cholecystectomy    . Vesicovaginal fistula closure  w/ tah    . Lasik      Family History  Problem Relation Age of Onset  . Lymphoma Mother   . Diabetic kidney disease Mother   . Diabetic kidney disease Brother   . Heart disease Sister   . Cancer Sister   . Heart attack Brother   . Myasthenia gravis Brother     Social History:  reports that she quit smoking about 31 years ago. She has never used smokeless tobacco. She reports that she does not drink alcohol or use illicit drugs.  Allergies  Allergen Reactions  . Epinephrine Other (See Comments)    Tachycardia when used as a local anesthetic  . Hydromorphone Other (See Comments)    Unknown reaction  . Latex Rash  . Morphine And Related Other (See Comments)    Unknown reaction  . Phenergan [Promethazine Hcl] Other (See Comments)    " GOES WACK YOU WITH THIS MEDICATION" STATEMENT MADE BY PATIENT  . Succinylcholine Other (See Comments)    Unknown reaction    MEDICATIONS:                                                                                                                     Prior to Admission:  Prescriptions prior to admission  Medication Sig Dispense Refill Last Dose  . amLODipine (NORVASC) 10 MG tablet Take 1 tablet (10 mg total) by mouth daily. 30 tablet 0 09/11/2015 at Unknown time  . benzonatate (TESSALON) 100 MG capsule Take 1 capsule (100 mg total) by mouth 3 (three) times daily as needed for cough. 20 capsule 0 prn  . carvedilol (COREG) 6.25 MG tablet Take 1 tablet (6.25 mg total)  by mouth 2 (two) times daily with a meal.   09/11/2015 at 0900  . cholecalciferol (VITAMIN D) 1000 units tablet Take 1,000 Units by mouth daily.   09/11/2015 at Unknown time  . clonazePAM (KLONOPIN) 0.5 MG tablet Take 1 tablet (0.5 mg total) by mouth 2 (two) times daily. (Patient taking differently: Take 0.5 mg by mouth at bedtime. ) 30 tablet 0 09/10/2015 at Unknown time  . cyanocobalamin 500 MCG tablet Take 500 mcg by mouth daily.   09/11/2015 at Unknown time  . diclofenac (VOLTAREN) 75 MG EC tablet Take 75 mg by mouth 2 (two) times daily.   09/11/2015 at am  . fluconazole (DIFLUCAN) 100 MG tablet Take 1 tablet (100 mg total) by mouth daily. X 7days (Patient taking differently: Take 100 mg by mouth daily. Started 09/07/15, for 7 days ending 09/13/15)   09/11/2015 at Unknown time  . FLUoxetine (PROZAC) 40 MG capsule Take 1 capsule (40 mg total) by mouth daily. 30 capsule 0 09/11/2015 at Unknown time  . glucosamine-chondroitin 500-400 MG tablet Take 1 tablet by mouth 2 (two) times daily.     09/11/2015 at Unknown time  . insulin aspart (NOVOLOG) 100 UNIT/ML injection Sliding scale  CBG 70 - 120: 0 units: CBG 121 - 150: 2 units; CBG 151 - 200:  3 units; CBG 201 - 250: 5 units; CBG 251 - 300: 8 units;CBG 301 - 350: 11 units; CBG 351 - 400: 15 units; CBG > 400 : 15 units and notify MD 10 mL 11 09/11/2015 at Unknown time  . insulin detemir (LEVEMIR) 100 UNIT/ML injection Inject 0.3 mLs (30 Units total) into the skin 2 (two) times daily. 10 mL 11 09/11/2015 at 0600  . levETIRAcetam (KEPPRA) 500 MG tablet Take 1 tablet (500 mg total) by mouth every 12 (twelve) hours. 60 tablet 0 09/11/2015 at am  . lisinopril (PRINIVIL,ZESTRIL) 20 MG tablet Take 1 tablet (20 mg total) by mouth daily. 30 tablet 0 09/11/2015 at Unknown time  . metFORMIN (GLUCOPHAGE) 1000 MG tablet Take 1,000 mg by mouth 2 (two) times daily with a meal.   09/11/2015 at am  . nystatin (MYCOSTATIN) 100000 UNIT/ML suspension Use as directed 5 mLs (500,000 Units  total) in the mouth or throat 4 (four) times daily. 60 mL 0 09/11/2015 at Unknown time  . oxyCODONE-acetaminophen (PERCOCET/ROXICET) 5-325 MG tablet Take 1 tablet by mouth every 6 (six) hours as needed for moderate pain. 30 tablet 0 prn  . pantoprazole (PROTONIX) 40 MG tablet Take 1 tablet (40 mg total) by mouth daily.   09/11/2015 at Unknown time  . pravastatin (PRAVACHOL) 40 MG tablet Take 1 tablet (40 mg total) by mouth daily. (Patient taking differently: Take 40 mg by mouth every evening. ) 30 tablet 3 09/10/2015 at Unknown time  . nitroGLYCERIN (NITROSTAT) 0.4 MG SL tablet Place 0.4 mg under the tongue every 5 (five) minutes as needed for chest pain.   not used   Scheduled: . sodium chloride   Intravenous STAT  . amLODipine  10 mg Oral Daily  . carvedilol  6.25 mg Oral BID WC  . diclofenac  75 mg Oral BID  . FLUoxetine  40 mg Oral Daily  . insulin aspart  0-9 Units Subcutaneous TID WC  . insulin detemir  5 Units Subcutaneous BID  . levETIRAcetam  250 mg Oral BID  . lisinopril  20 mg Oral Daily  . pantoprazole  40 mg Oral Daily  . sodium chloride  3 mL Intravenous Q12H     ROS:                                                                                                                                       History obtained from unobtainable from patient due to sever lethargy   Blood pressure 143/55, pulse 74, temperature 98.9 F (37.2 C), temperature source Oral, resp. rate 18, weight 73.7 kg (162 lb 7.7 oz), SpO2 100 %.   Neurologic Examination:  HEENT-  Normocephalic, no lesions, without obvious abnormality.  Normal external eye and conjunctiva.  Normal TM's bilaterally.  Normal auditory canals and external ears. Normal external nose, mucus membranes and septum.  Normal pharynx. Cardiovascular- S1, S2 normal, pulses palpable throughout   Lungs- chest clear, no wheezing, rales,  normal symmetric air entry Abdomen- normal findings: bowel sounds normal Extremities- no edema Lymph-no adenopathy palpable Musculoskeletal-no joint tenderness, deformity or swelling Skin-warm and dry, no hyperpigmentation, vitiligo, or suspicious lesions  Neurological Examination Mental Status: Alert, not talking a lot. Very drowsy.  Able to follow simple commands without difficulty. Cranial Nerves: II: Discs flat bilaterally; Visual fields grossly normal, pupils equal, round, reactive to light and accommodation III,IV, VI: ptosis not present, extra-ocular motions intact bilaterally V,VII: smile symmetric, facial light touch sensation normal bilaterally VIII: hearing normal bilaterally IX,X: uvula rises symmetrically XI: bilateral shoulder shrug XII: midline tongue extension Motor: Moving all extremities antigravity and to commands Sensory: Pinprick and light touch intact throughout, bilaterally Deep Tendon Reflexes: 1+ and symmetric throughout Plantars: Right: downgoing   Left: downgoing Cerebellar: normal finger-to-nose,and normal heel-to-shin test Gait: not tested      Lab Results: Basic Metabolic Panel:  Recent Labs Lab 09/06/15 0717 09/07/15 0420 09/11/15 1250 09/12/15 0645  NA 138 137 140 142  K 4.0 4.0 4.1 3.0*  CL 103 100* 103 108  CO2 29 28 25 23   GLUCOSE 212* 220* 111* 81  BUN 10 12 16 9   CREATININE 0.47 0.48 0.49 0.43*  CALCIUM 8.7* 8.7* 9.5 9.0  MG 2.0 1.9  --   --     Liver Function Tests:  Recent Labs Lab 09/06/15 0717 09/07/15 0420 09/11/15 1250  AST 20 21 21   ALT 13* 11* 8*  ALKPHOS 93 93 96  BILITOT 1.2 1.2 0.7  PROT 5.4* 5.6* 5.7*  ALBUMIN 3.0* 3.0* 3.0*   No results for input(s): LIPASE, AMYLASE in the last 168 hours. No results for input(s): AMMONIA in the last 168 hours.  CBC:  Recent Labs Lab 09/06/15 0717 09/07/15 0420 09/11/15 1250 09/12/15 0645  WBC 9.2 9.8 10.7* 9.3  NEUTROABS 5.7 6.5 8.0*  --   HGB 13.2 13.4  14.2 14.7  HCT 39.5 40.3 42.1 42.9  MCV 97.3 98.3 97.5 96.0  PLT 416* 426* 411* 450*    Cardiac Enzymes:  Recent Labs Lab 09/11/15 1605  CKTOTAL 22*    Lipid Panel: No results for input(s): CHOL, TRIG, HDL, CHOLHDL, VLDL, LDLCALC in the last 168 hours.  CBG:  Recent Labs Lab 09/12/15 0146 09/12/15 0420 09/12/15 0754 09/12/15 0755 09/12/15 0816  GLUCAP 182* 148* 48* 50* 9    Microbiology: Results for orders placed or performed during the hospital encounter of 08/28/15  Culture, Urine     Status: None   Collection Time: 08/28/15 10:21 AM  Result Value Ref Range Status   Specimen Description URINE, CATHETERIZED  Final   Special Requests ADDED 2315  Final   Culture NO GROWTH 2 DAYS  Final   Report Status 08/30/2015 FINAL  Final  Blood culture (routine x 2)     Status: None   Collection Time: 08/28/15 10:26 AM  Result Value Ref Range Status   Specimen Description BLOOD LEFT FOREARM  Final   Special Requests BOTTLES DRAWN AEROBIC AND ANAEROBIC 5MLS  Final   Culture NO GROWTH 5 DAYS  Final   Report Status 09/02/2015 FINAL  Final  Blood culture (routine x 2)     Status: None  Collection Time: 08/28/15 10:58 AM  Result Value Ref Range Status   Specimen Description BLOOD LEFT FOREARM  Final   Special Requests IN PEDIATRIC BOTTLE 2.5MLS  Final   Culture NO GROWTH 5 DAYS  Final   Report Status 09/02/2015 FINAL  Final  MRSA PCR Screening     Status: None   Collection Time: 08/28/15  4:53 PM  Result Value Ref Range Status   MRSA by PCR NEGATIVE NEGATIVE Final    Comment:        The GeneXpert MRSA Assay (FDA approved for NASAL specimens only), is one component of a comprehensive MRSA colonization surveillance program. It is not intended to diagnose MRSA infection nor to guide or monitor treatment for MRSA infections.   Culture, respiratory (tracheal aspirate)     Status: None   Collection Time: 08/28/15  4:59 PM  Result Value Ref Range Status   Specimen  Description TRACHEAL ASPIRATE  Final   Special Requests NONE  Final   Gram Stain   Final    MODERATE WBC PRESENT, PREDOMINANTLY PMN RARE SQUAMOUS EPITHELIAL CELLS PRESENT RARE GRAM NEGATIVE RODS Performed at [facility]    Culture   Final    RARE CANDIDA ALBICANS Performed at [facility]    Report Status 08/30/2015 FINAL  Final    Coagulation Studies: No results for input(s): LABPROT, INR in the last 72 hours.  Imaging: Dg Chest 2 View  09/11/2015  CLINICAL DATA:  Altered mental status, recent UTI EXAM: CHEST  2 VIEW COMPARISON:  08/31/2015 FINDINGS: Cardiomediastinal silhouette is stable. No acute infiltrate or pleural effusion. No pulmonary edema. Mild elevation of the right hemidiaphragm. Mild degenerative changes lower thoracic spine. IMPRESSION: No active cardiopulmonary disease. Electronically Signed   By: Lahoma Crocker M.D.   On: 09/11/2015 13:59   Ct Head Wo Contrast  09/11/2015  CLINICAL DATA:  75 year old female with altered mental status (not otherwise specified at the time of this report). Recent left subdural hematoma. Initial encounter. EXAM: CT HEAD WITHOUT CONTRAST TECHNIQUE: Contiguous axial images were obtained from the base of the skull through the vertex without intravenous contrast. COMPARISON:  09/02/2015 and earlier FINDINGS: Interval improved sphenoid sinus aeration with resolved bubbly opacity. Otherwise stable and largely clear paranasal sinuses, chronic appearing left orbital floor fracture. No acute osseous abnormality identified. Stable orbit and scalp soft tissues. Mixed density left subdural hematoma, more isodense than on 09/02/2015. Hematoma thickness up to 9-10 mm today, stable. Rightward midline shift of 5 mm also is stable. Mild effacement of the left lateral ventricle is stable. No ventriculomegaly. Basilar cisterns remain patent. No new intracranial hemorrhage identified. No cortically based acute infarct identified. Stable gray-white matter  differentiation. IMPRESSION: 1. Stable size of the left subdural hematoma since 09/02/2015 with 5 mm of rightward midline shift. Blood products now are more isodense. 2. No new intracranial abnormality identified. Electronically Signed   By: Genevie Ann M.D.   On: 09/11/2015 12:42   Mr Brain Wo Contrast  09/11/2015  CLINICAL DATA:  75 year old hypertensive diabetic female with altered mental status. Recent left subdural hematoma. Subsequent encounter. EXAM: MRI HEAD WITHOUT CONTRAST TECHNIQUE: Multiplanar, multiecho pulse sequences of the brain and surrounding structures were obtained without intravenous contrast. COMPARISON:  09/11/2015 and 09/02/2015 head CT. 09/12/2011 brain MR. FINDINGS: Sequences are motion degraded No acute thrombotic infarct. Large broad-based left subdural hematoma with maximal thickness of 12 mm versus 09/01/2005 maximal thickness of 9.5 mm. Mass effect upon the left lateral ventricle with  4.4 mm midline shift to the right without significant change compared to 09/02/2015. Minimal small vessel disease type changes. Baseline atrophy. No intracranial mass lesion seen separate from above described findings. Major intracranial vascular structures are patent. Post left lens replacement. Exophthalmos. Symmetric normal extra-ocular muscles. Left orbital floor fracture. Transverse ligament hypertrophy with mild narrowing ventral thecal sac. Cervical medullary junction unremarkable. Minimal partial opacification mastoid air cells. IMPRESSION: Sequences are motion degraded No acute thrombotic infarct. Large broad-based left subdural hematoma with maximal thickness of 12 mm versus 09/01/2005 maximal thickness of 9.5 mm. Mass effect upon the left lateral ventricle with 4.4 mm midline shift to the right without significant change compared to 09/02/2015. Minimal small vessel disease type changes. Baseline atrophy. No intracranial mass lesion seen separate from above described findings. Post left lens  replacement. Exophthalmos. Symmetric normal extra-ocular muscles. Left orbital floor fracture (noted on the 08/29/2015 exam). Electronically Signed   By: Genia Del M.D.   On: 09/11/2015 17:43       Assessment and plan per attending neurologist  Etta Quill PA-C [facility] [phone]  09/12/2015, 9:41 AM   Assessment/Plan:  75 YO female with known left SDH which has not changed over time. It was noted she was more lethargic and appeared to have increased dysarthria after initiation of keppra. She has had no seizures post SDH and has no seizure history. She is on a very low dose and currently 10 days post fall. Keppra is known to have lethargy as a SE and this may be secondary to [place]. As she is 10 days out with no seizures at this time would recommend stopping Keppra and obtaining a EEG. If EEG shows no epileptiform activity would likely not place patient back on AED.

## 2015-09-12 NOTE — Progress Notes (Signed)
Routine EEG completed, results pending. 

## 2015-09-12 NOTE — Procedures (Signed)
History: 75 yo F with encephalopathy  Sedation: None  Technique: This is a 21 channel routine scalp EEG performed at the bedside with bipolar and monopolar montages arranged in accordance to the international 10/20 system of electrode placement. One channel was dedicated to EKG recording.    Background: The background consists of intermixed alpha and beta activities. There is a well defined posterior dominant rhythm of 9 Hz that attenuates with eye opening. This is better seen on the right than left. Sleep is recorded with  Structures being seen bilaterally, but slightly attenuated on the left. .   Photic stimulation: Physiologic driving is not performed  EEG Abnormalities: 1) Attenuation of the PDR/sleep structures on the left.   Clinical Interpretation: This EEG is asymmetric consistent with the known subdural fluid collection. Though left hemispheric dysfunction is also possible, increased material through which to record the brain activity can result in this decreased amplitude.   There was no seizure or seizure predisposition recorded on this study. Please note that a normal EEG does not preclude the possibility of epilepsy.   Roland Rack, MD [facility] [phone]  If 7pm- 7am, please page neurology on call as listed in [place].

## 2015-09-12 NOTE — Evaluation (Signed)
Clinical/Bedside Swallow Evaluation Patient Details  Name: Margaret Fischer MRN: [id number] Date of Birth: [birth year]  Today's Date: 09/12/2015 Time: SLP Start Time (ACUTE ONLY): 0801 SLP Stop Time (ACUTE ONLY): 0831 SLP Time Calculation (min) (ACUTE ONLY): 30 min  Past Medical History:  Past Medical History  Diagnosis Date  . Diabetes mellitus     Insulin dependent  . Chest pain, non-cardiac     History of 2 normal cardiac catheterizations  . Bradycardia   . Dizziness     Chronic  . Fibromyalgia   . Neuropathy ([place])   . Chronic chest pain   . Hyperlipidemia   . Chronic neck pain   . Chronic back pain   . Basal cell carcinoma of nose   . Depression   . Thyroid cyst   . Goiter   . 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 Hypertension   . Depression   . Anxiety   . Migraine    Past Surgical History:  Past Surgical History  Procedure Laterality Date  . Total knee arthroplasty    . Cardiac catheterization      History of 2 caths, reportedly normal  . Joint replacement    . Rotator cuff repair    . Appendectomy    . Cystectomy    . Rotator cuff repair    . Breast lumpectomy    . Abdominal hysterectomy    . Biceps tendon repair    . Refractive surgery    . Right nares  09/2011    basal cell surgery  . Cholecystectomy    . Vesicovaginal fistula closure w/ tah    . Lasik     HPI:  75 yo female former smoker presented with altered mental status, dysarthria.  Pt with recent Left SDH, lactic acidosis, fever and hyperglycemia requiring intubation 1/8-1/11. Pt for swallow evaluation per orders.  She does have h/o esophageal dysmotility and reflux per 2008 esophagram.     Assessment / Plan / Recommendation Clinical Impression  Pt presents with functional oropharyngeal swallow ability based on clinical swallow evaluation for puree/tihn diet.  She demonstrated poor mastication abilities - is edentulous and dentures not present.  Decreased labial closure with solids on left with  decreased awareness, SLP removed solid cracker portion and used pudding to faciliate oral clearance of residuals pt not aware of.  Of note, pt followed directions approximately 50% of the time verbally increasing to 70% with visual cues.  She phonated x2 only with near whisper production only.  No s/s of aspiration across all consistencies provided. Educated pt to recommendations for dentures to be brought in and reviewed compensation strategie with pt verbally and in writing.  Hopeful for improvement in swallow ability with blood glucose stablization as it remained low t/o testing despite intake - RN aware and managing.  Informed RN of recommendations.  Rec puree/thin diet and family to bring in dentures to assess for dietary advancement readiness.      Aspiration Risk  Mild aspiration risk    Diet Recommendation   dys1/thin  Medication Administration: Whole meds with puree (follow with liquids) Supervision: Patient able to self feed Compensations: Small sips/bites;Slow rate (drink liquids t/o meal ) Postural Changes: Seated upright at 90 degrees;Remain upright for at least 30 minutes after po intake    Other  Recommendations Oral Care Recommendations: Oral care BID   Follow up Recommendations    TBD   Frequency and Duration min 2x/week  2 weeks  Prognosis Prognosis for Safe Diet Advancement: Good Barriers to Reach Goals: Cognitive deficits      Swallow Study   General Date of Onset: 09/12/15 HPI: 75 yo female former smoker presented with altered mental status, dysarthria.  Pt with recent Left SDH, lactic acidosis, fever and hyperglycemia requiring intubation 1/8-1/11. Pt for swallow evaluation per orders.  She does have h/o esophageal dysmotility and reflux per 2008 esophagram.   Type of Study: Bedside Swallow Evaluation Previous Swallow Assessment: BSE recently = one week ago, rec dys1/thin  Diet Prior to this Study: NPO Respiratory Status: Room air History of Recent  Intubation: No Behavior/Cognition: Lethargic/Drowsy;Requires cueing;Cooperative Oral Cavity Assessment: Within Functional Limits Oral Care Completed by SLP: No Oral Cavity - Dentition: Edentulous;Dentures, not available Vision: Functional for self-feeding Self-Feeding Abilities: Needs assist;Needs set up Patient Positioning: Upright in bed Baseline Vocal Quality: Low vocal intensity;Other (comment) (whisper x2 during session only despite max cues) Volitional Cough: Strong Volitional Swallow: Unable to elicit    Oral/Motor/Sensory Function Overall Oral Motor/Sensory Function: Generalized oral weakness (? motor planning deficits)   Ice Chips Ice chips: Not tested   Thin Liquid Thin Liquid: Within functional limits Presentation: Cup;Self Fed;Straw    Nectar Thick Nectar Thick Liquid: Not tested   Honey Thick Honey Thick Liquid: Not tested   Puree     Solid   GO   Solid: Impaired Oral Phase Impairments: Impaired mastication;Poor awareness of bolus;Reduced lingual movement/coordination;Reduced labial seal Oral Phase Functional Implications: Oral residue Other Comments: slp removed portion of cracker protruding from labia and provided pudding to faciliate oral clearance of residuals- poor awareness and poor mastication abilities    Functional Assessment Tool Used: clinical judgement Functional Limitations: Swallowing Swallow Current Status [id number]): At least 20 percent but less than 40 percent impaired, limited or restricted Swallow Goal Status [phone]): At least 1 percent but less than 20 percent impaired, limited or restricted   Luanna Salk, [facility] SLP [phone]

## 2015-09-12 NOTE — Care Management Note (Signed)
[facility] Management Note  Patient Details  Name: Crystaline Edstrom Bonnie MRN: [id number] Date of Birth: [birth year]  Subjective/Objective:                 Patient placed in observation form [place]. Slurred speech, EEG, neuro consult.    Action/Plan:  Anticipate DC to [facility] as facilitated through [place] when medically stable  Expected Discharge Date:                  Expected Discharge Plan:  Skilled Nursing Facility  In-House Referral:  Clinical Social Work  Discharge planning Services  CM Consult  Post Acute Care Choice:    Choice offered to:     DME Arranged:    DME Agency:     HH Arranged:    [place] Agency:     Status of Service:  In process, will continue to follow  Medicare Important Message Given:    Date Medicare IM Given:    Medicare IM give by:    Date Additional Medicare IM Given:    Additional Medicare Important Message give by:     If discussed at [place] of Stay Meetings, dates discussed:    Additional Comments:  Carles Collet, RN 09/12/2015, 2:28 PM

## 2015-09-12 NOTE — Progress Notes (Signed)
PROGRESS NOTE  Margaret Fischer [id number] DOB: 1941/08/08 DOA: 09/11/2015 PCP: Delia Chimes, NP  Brief History 75 year old female with a history of diabetes mellitus, hypertension, hyperlipidemia, chronic back pain who was recently discharged from the hospital after an 11 day stay on 09/07/2015. During the hospital stay, the patient was treated for left-sided subdural hematoma resulting in a mechanical fall. In addition, the patient also had respiratory failure requiring intubation during that hospitalization. She was evaluated by speech therapy prior to discharge and was placed on a regular diet. He was also noted that the patient was able to phonate although she had a very soft voice. The patient was discharged to a skilled nursing facility. Apparently there was concern for some encephalopathy as well as increasing dysphasia at the nursing facility. As a result, the patient was brought to the hospital for further evaluation. Evaluation in the emergency department including CT of the brain revealed a stable left subdural hematoma. Assessment/Plan: Acute encephalopathy -According to the patient's son, the patient is improved, but not yet at her baseline -Biggest concern remains the patient's dysphasia and dysarthria which the son states is worse than her last hospitalization -09/11/2015 MRI brain--negative for acute findings; stable midline shift compared to 09/02/2015 -09/12/2015 a.m.--patient was noted to be hypoglycemic with CBG 48--unclear if the patient has had transient hypoglycemia in the recent past -Decrease Levemir dosing -Check for other reversible causes of encephalopathy -Unclear if the patient's Keppra was contributing to the patient's encephalopathy--defer any changes to neurology -Urinalysis is negative for pyuria -CPK 22 -EKG sinus rhythm with nonspecific ST changes Subdural hematoma -Stable per CT and MRI imaging as discussed above -Neurology has been consulted  regarding the need for continued seizure prophylaxis -pt has outpt appt with Dr. Cyndy Freeze in 2-3 weeks Diabetes mellitus type 2, uncontrolled -09/04/2015 and hemoglobin A1c 11.1 -Adjust the patient's Levemir due to hypoglycemia -NovoLog sliding scale Dysphagia -Speech therapy evaluation-->dysphagia 1 with thin Hypertension -Continue amlodipine, lisinopril, carvedilol Lactic acidosis  -The patient is afebrile and hemodynamically stable  -Likely due to volume depletion  -Improved with fluid resuscitation  -metformin may have contributed Thrombocytosis  -Likely acute phase reactant  -Check iron studies  anxiety/depression  -hold klonopin in setting of encephalopathy -continue fluoxetine Hypokalemia -replete -check mag Hx of THC use -counseled Family Communication:   Son updated on phone--09/13/15 Disposition Plan:   SNF 1-2 days    Procedures/Studies: Dg Chest 2 View  09/11/2015  CLINICAL DATA:  Altered mental status, recent UTI EXAM: CHEST  2 VIEW COMPARISON:  08/31/2015 FINDINGS: Cardiomediastinal silhouette is stable. No acute infiltrate or pleural effusion. No pulmonary edema. Mild elevation of the right hemidiaphragm. Mild degenerative changes lower thoracic spine. IMPRESSION: No active cardiopulmonary disease. Electronically Signed   By: Lahoma Crocker M.D.   On: 09/11/2015 13:59   Dg Abd 1 View  08/30/2015  CLINICAL DATA:  Nausea, vomiting. EXAM: ABDOMEN - 1 VIEW COMPARISON:  08/28/2015 FINDINGS: The bowel gas pattern is normal. No radio-opaque calculi or other significant radiographic abnormality are seen. Enteric catheter overlies the expected location of the gastric body. No radiographic evidence of organomegaly.Cholecystectomy clips are noted. Rectal probe is in place. Postsurgical clips overlie the right groin. IMPRESSION: Nonobstructive bowel gas pattern. Enteric catheter with distal tip at the expected location of gastric body. Electronically Signed   By: Fidela Salisbury  M.D.   On: 08/30/2015 16:29   Ct Head Wo Contrast  09/11/2015  CLINICAL DATA:  75 year old  female with altered mental status (not otherwise specified at the time of this report). Recent left subdural hematoma. Initial encounter. EXAM: CT HEAD WITHOUT CONTRAST TECHNIQUE: Contiguous axial images were obtained from the base of the skull through the vertex without intravenous contrast. COMPARISON:  09/02/2015 and earlier FINDINGS: Interval improved sphenoid sinus aeration with resolved bubbly opacity. Otherwise stable and largely clear paranasal sinuses, chronic appearing left orbital floor fracture. No acute osseous abnormality identified. Stable orbit and scalp soft tissues. Mixed density left subdural hematoma, more isodense than on 09/02/2015. Hematoma thickness up to 9-10 mm today, stable. Rightward midline shift of 5 mm also is stable. Mild effacement of the left lateral ventricle is stable. No ventriculomegaly. Basilar cisterns remain patent. No new intracranial hemorrhage identified. No cortically based acute infarct identified. Stable gray-white matter differentiation. IMPRESSION: 1. Stable size of the left subdural hematoma since 09/02/2015 with 5 mm of rightward midline shift. Blood products now are more isodense. 2. No new intracranial abnormality identified. Electronically Signed   By: Genevie Ann M.D.   On: 09/11/2015 12:42   Ct Head Wo Contrast  09/02/2015  CLINICAL DATA:  Followup subdural hematoma. EXAM: CT HEAD WITHOUT CONTRAST TECHNIQUE: Contiguous axial images were obtained from the base of the skull through the vertex without intravenous contrast. COMPARISON:  Prior study from 08/28/2015. FINDINGS: Left holo hemispheric subdural hematoma again seen. Hematoma measures up to 1 cm in maximal thickness, decreased from prior. Small amount of hemorrhage extends along the falx as well. 4-5 mm of left-to-right shift. Mild edema with sulcal effacement within the left cerebral hemisphere. No hydrocephalus  or evidence of ventricular trapping. No intraventricular blood are definite subarachnoid hemorrhage. No acute large vessel territory infarct. Atrophy with chronic small vessel ischemic disease again noted. Vascular calcifications within the carotid siphons. Left parietal scalp contusion present. No acute abnormality about the orbits. Exophthalmos is stable. Left orbital floor fracture noted, better evaluated on prior maxillofacial CT. Increase mucosal thickening throughout the ethmoidal air cells and sphenoid sinuses as compared to previous. No mastoid effusion. Calvarium intact. IMPRESSION: 1. Slightly decreased size of left subdural hematoma, now measuring up to 1 cm in maximal thickness. 2. 4-5 mm of left-to-right shift. No hydrocephalus or evidence of ventricular trapping. 3. No other new intracranial process. Electronically Signed   By: Jeannine Boga M.D.   On: 09/02/2015 06:57   Ct Head Wo Contrast  08/29/2015  CLINICAL DATA:  Follow-up LEFT subdural hematoma, altered mental status. Struck head yesterday. EXAM: CT HEAD WITHOUT CONTRAST TECHNIQUE: Contiguous axial images were obtained from the base of the skull through the vertex without intravenous contrast. COMPARISON:  CT head August 28, 2015 FINDINGS: LEFT holo hemispheric dense acute subdural hematoma measuring up to 12 mm with resultant 2 mm LEFT-to-RIGHT midline shift. 2 mm LEFT parafalcine subdural hematoma with trace subarachnoid hemorrhage along the interhemispheric fissure, RIGHT frontal sulci. No ventricular entrapment or hydrocephalus. No intraparenchymal hemorrhage. No acute large vascular territory infarct. Basal cisterns are patent. Moderate calcific atherosclerosis of the carotid siphons. Small LEFT maxillary sinus air-fluid level. Suspected LEFT orbital floor fracture may be acute. Mastoid air cells are well aerated. No skull fracture. IMPRESSION: Similar appearance of acute 12 mm holo hemispheric subdural hematoma resulting in 2 mm  LEFT-to-RIGHT midline shift. 2 mm parafalcine subdural hematoma with small amount of subarachnoid hemorrhage. Suspected LEFT orbital floor fracture may be acute. Recommend dedicated maxillofacial CT. Electronically Signed   By: Elon Alas M.D.   On: 08/29/2015 05:03   Ct  Head Wo Contrast  08/28/2015  CLINICAL DATA:  Followup subdural hematoma. EXAM: CT HEAD WITHOUT CONTRAST TECHNIQUE: Contiguous axial images were obtained from the base of the skull through the vertex without intravenous contrast. COMPARISON:  03/11/2014 FINDINGS: There is a large left subdural hematoma measuring 13 mm in maximum thickness, image 17 of series 201. This extends anteriorly over the left frontal lobe with a parafalcine component measuring 3 mm in thickness. The ventricular volumes appear normal. No hydrocephalus or intracranial hemorrhage. There is mild left-to-right midline shift of 2 mm. Prominence of the sulci identified compatible with brain atrophy. There is mild diffuse low attenuation within the subcortical and periventricular white matter compatible with chronic microvascular disease.Fluid level is noted within the left maxillary sinus. The mastoid air cells are clear. The calvarium is intact. IMPRESSION: 1. Acute left subdural hematoma measuring 13 mm and causing approximately 2 mm of left-to-right midline shift. 2. Small vessel ischemic change and brain atrophy. Electronically Signed   By: Kerby Moors M.D.   On: 08/28/2015 15:11   Ct Head Wo Contrast  08/28/2015  CLINICAL DATA:  75 year old female with altered mental status and agitated EXAM: CT HEAD WITHOUT CONTRAST TECHNIQUE: Contiguous axial images were obtained from the base of the skull through the vertex without intravenous contrast. COMPARISON:  Prior head CT 03/11/2014 FINDINGS: Acute crescentic extra-axial high attenuation fluid overlying the left cerebral convexity consistent with acute subdural hematoma. The maximal depth at the mid temporal lobe is  approximately 13 mm. Additionally, there is likely some focal subdural blood overlying the right frontal lobe as well although this is in a region of combined streak and beam hardening artifact. No evidence of mass, mass effect, hydrocephalus or acute infarct. No intraventricular or subarachnoid hemorrhage visualized. Approximately 4 mm left-to-right midline shift. Motion artifact slightly limits evaluation for subtle calvarial fracture. No definite displaced calvarial fracture identified. Grossly normally aerated mastoid air cells and paranasal sinuses save for a small amount of hemo sinus layering in the left maxillary sinus. Incompletely imaged soft tissue contusion overlying the left maxillary antrum. There is slight irregularity of the inferior wall of the left orbit. Fracture is not excluded. IMPRESSION: 1. Acute subdural hematoma overlying the left cerebral convex [place] measuring up to 13 mm in width. There is associated local mass effect with 4 mm left-to-right midline shift. 2. Additional small volume subdural hematoma versus artifact overlying the right frontal lobe. 3. Incompletely imaged soft tissue contusion overlying the left maxillary antrum with associated small volume left maxillary hemo sinus. 4. Question of possible left orbital floor fracture which is incompletely imaged. Recommend further evaluation with dedicated CT scan of the face when the patient is able to cooperate and there is less potential for severe motion artifact. Critical Value/emergent results were called by telephone at the time of interpretation on 08/28/2015 at 11:55 am to Dr. Pattricia Boss , who verbally acknowledged these results. Electronically Signed   By: Jacqulynn Cadet M.D.   On: 08/28/2015 11:56   Mr Brain Wo Contrast  09/11/2015  CLINICAL DATA:  75 year old hypertensive diabetic female with altered mental status. Recent left subdural hematoma. Subsequent encounter. EXAM: MRI HEAD WITHOUT CONTRAST TECHNIQUE:  Multiplanar, multiecho pulse sequences of the brain and surrounding structures were obtained without intravenous contrast. COMPARISON:  09/11/2015 and 09/02/2015 head CT. 09/12/2011 brain MR. FINDINGS: Sequences are motion degraded No acute thrombotic infarct. Large broad-based left subdural hematoma with maximal thickness of 12 mm versus 09/01/2005 maximal thickness of 9.5 mm. Mass effect upon  the left lateral ventricle with 4.4 mm midline shift to the right without significant change compared to 09/02/2015. Minimal small vessel disease type changes. Baseline atrophy. No intracranial mass lesion seen separate from above described findings. Major intracranial vascular structures are patent. Post left lens replacement. Exophthalmos. Symmetric normal extra-ocular muscles. Left orbital floor fracture. Transverse ligament hypertrophy with mild narrowing ventral thecal sac. Cervical medullary junction unremarkable. Minimal partial opacification mastoid air cells. IMPRESSION: Sequences are motion degraded No acute thrombotic infarct. Large broad-based left subdural hematoma with maximal thickness of 12 mm versus 09/01/2005 maximal thickness of 9.5 mm. Mass effect upon the left lateral ventricle with 4.4 mm midline shift to the right without significant change compared to 09/02/2015. Minimal small vessel disease type changes. Baseline atrophy. No intracranial mass lesion seen separate from above described findings. Post left lens replacement. Exophthalmos. Symmetric normal extra-ocular muscles. Left orbital floor fracture (noted on the 08/29/2015 exam). Electronically Signed   By: Genia Del M.D.   On: 09/11/2015 17:43   Dg Chest Port 1 View  08/31/2015  CLINICAL DATA:  Respiratory failure EXAM: PORTABLE CHEST 1 VIEW COMPARISON:  08/30/2015 FINDINGS: Endotracheal tube tip 5.3 cm above the carina. Nasogastric tube enters the stomach. Borderline enlargement of the cardiopericardial silhouette with atherosclerotic  calcification of the aortic arch. Blunted left costophrenic angle. Improved aeration at the right lung base. IMPRESSION: 1. Blunting of the left costophrenic angle-left pleural effusion not excluded. 2. Improved aeration at the right lung base. 3. Otherwise stable. Electronically Signed   By: Van Clines M.D.   On: 08/31/2015 07:17   Dg Chest Port 1 View  08/30/2015  CLINICAL DATA:  75 year old female currently intubated EXAM: PORTABLE CHEST 1 VIEW COMPARISON:  Prior chest x-ray 08/29/2015 FINDINGS: The patient is intubated. The tip of the endotracheal tube is 4.8 cm above the carina. A nasogastric tube is present, the tip lies below the diaphragm off the field of view but likely within the stomach. Inspiratory volumes remain low. Improved aeration of the left lung base likely secondary to decreasing atelectasis. Persistent patchy airspace opacity in the right lung base. No large pleural effusion or pneumothorax. No suspicious nodule or mass. No acute osseous abnormality. IMPRESSION: 1. Stable and satisfactory support apparatus. 2. Decreasing atelectasis in the left lung base. 3. Persistent patchy airspace opacity in the right lung base which may reflect atelectasis or infiltrate. Electronically Signed   By: Jacqulynn Cadet M.D.   On: 08/30/2015 07:46   Dg Chest Port 1 View  08/29/2015  CLINICAL DATA:  75 year old female with respiratory failure. Subsequent encounter. EXAM: PORTABLE CHEST 1 VIEW COMPARISON:  08/28/2015. FINDINGS: Endotracheal tube tip 4 cm above the carina. Nasogastric tube courses below the diaphragm. Tip is not included on the present exam. Cardiomegaly. Pulmonary vascular prominence most notable centrally. Basilar subsegmental atelectasis with poor inspiration. Basilar infiltrate secondary less likely consideration. No gross pneumothorax. Calcified aorta is slightly tortuous. IMPRESSION: Poor inspiratory portable exam with pulmonary vascular prominence most notable centrally.  Basilar subsegmental atelectasis. Nasogastric tube courses below the diaphragm. Tip is not included on the present exam. Electronically Signed   By: Genia Del M.D.   On: 08/29/2015 08:05   Dg Chest Portable 1 View  08/28/2015  CLINICAL DATA:  Patient status post intubation. Loss of consciousness. EXAM: PORTABLE CHEST 1 VIEW COMPARISON:  Chest radiograph 01/27/2015. FINDINGS: ET tube terminates in the mid trachea. Stable cardiomegaly. Minimal heterogeneous opacities left lung base. No pleural effusion or pneumothorax. Old left lateral rib fracture.  IMPRESSION: ET tube terminates in mid trachea. Probable left basilar atelectasis. Electronically Signed   By: Lovey Newcomer M.D.   On: 08/28/2015 10:39   Dg Abd Portable 1v  08/28/2015  CLINICAL DATA:  Encounter for orogastric (OG) tube placement EXAM: PORTABLE ABDOMEN - 1 VIEW COMPARISON:  None. FINDINGS: OG tube in the left upper quadrant. Its position suggests it lies along the greater curvature with tip in the distal body. IMPRESSION: OG tube as described. Electronically Signed   By: Skipper Cliche M.D.   On: 08/28/2015 19:23   Dg Hip Unilat With Pelvis 2-3 Views Left  09/03/2015  CLINICAL DATA:  Pain following fall 1 week prior EXAM: DG HIP (WITH OR WITHOUT PELVIS) 2-3V LEFT COMPARISON:  None. FINDINGS: Frontal pelvis as well as frontal and lateral left hip images were obtained. Bones are diffusely osteoporotic. There is no demonstrable fracture or dislocation. There is moderate symmetric narrowing of both hip joints. No erosive change. There are surgical clips in the right inguinal region. IMPRESSION: Moderate symmetric narrowing of both hip joints. Diffuse osteoporosis. No acute fracture or dislocation. Electronically Signed   By: Lowella Grip III M.D.   On: 09/03/2015 19:12   Ct Maxillofacial Wo Cm  08/29/2015  CLINICAL DATA:  Patient came in yesterday with AMS, SDH Scan showed possible left orbital fracture. Left eye is swollen and bruised EXAM:  CT MAXILLOFACIAL WITHOUT CONTRAST TECHNIQUE: Multidetector CT imaging of the maxillofacial structures was performed. Multiplanar CT image reconstructions were also generated. A small metallic BB was placed on the right temple in order to reliably differentiate right from left. COMPARISON:  Head CT, 08/29/2015 at 4:45 a.m. FINDINGS: There is a depressed comminuted fracture of the left orbital floor. Fracture is depressed 9 mm. Oral fat extends through the defect. There is no entrapment of the inferior rectus muscle. There is mild left lateral periorbital soft tissue edema. Patient has had a previous left cataract surgery. Left globe otherwise unremarkable. No abnormality of the postseptal left orbit. There are no other fractures. Dependent fluid is seen in the left maxillary sinus. Sinuses otherwise clear. Clear mastoid air cells and middle ear cavities. There are indwelling nasogastric and endotracheal tubes. No neck masses or adenopathy. IMPRESSION: 1. Depressed, comminuted left orbital floor fracture associated with dependent hemorrhage in the left maxillary sinus. No extraocular muscle entrapment. No other fractures. Electronically Signed   By: Lajean Manes M.D.   On: 08/29/2015 13:51        Subjective:  the patient is awake and alert but only intermittently phonates. She does follow one-step commands. She is able to nod her head to answer yes/no. The patient denies any headache, chest pain, short of breath, abdominal pain, nausea, vomiting, diarrhea.   Objective: Filed Vitals:   09/11/15 1826 09/11/15 2307 09/12/15 0500 09/12/15 0604  BP: 142/57 158/61  143/55  Pulse: 70 68  74  Temp: 97.7 F (36.5 C) 98.5 F (36.9 C)  98.9 F (37.2 C)  TempSrc: Oral Oral  Oral  Resp: 20 18  18   Weight:   73.7 kg (162 lb 7.7 oz)   SpO2: 98% 97%  100%    Intake/Output Summary (Last 24 hours) at 09/12/15 0830 Last data filed at 09/12/15 0659  Gross per 24 hour  Intake 968.33 ml  Output      0 ml  Net  968.33 ml   Weight change:  Exam:   General:  Pt is alert, follows commands appropriately, not in acute distress  HEENT: No icterus, No thrush, No neck mass, [place]/AT  Cardiovascular: RRR, S1/S2, no rubs, no gallops  Respiratory: CTA bilaterally, no wheezing, no crackles, no rhonchi  Abdomen: Soft/+BS, non tender, non distended, no guarding; no paraspinal medically   Extremities: No edema, No lymphangitis, No petechiae, No rashes, no synovitis; no clubbing or cyanosis   Data Reviewed: Basic Metabolic Panel:  Recent Labs Lab 09/06/15 0717 09/07/15 0420 09/11/15 1250 09/12/15 0645  NA 138 137 140 142  K 4.0 4.0 4.1 3.0*  CL 103 100* 103 108  CO2 29 28 25 23   GLUCOSE 212* 220* 111* 81  BUN 10 12 16 9   CREATININE 0.47 0.48 0.49 0.43*  CALCIUM 8.7* 8.7* 9.5 9.0  MG 2.0 1.9  --   --    Liver Function Tests:  Recent Labs Lab 09/06/15 0717 09/07/15 0420 09/11/15 1250  AST 20 21 21   ALT 13* 11* 8*  ALKPHOS 93 93 96  BILITOT 1.2 1.2 0.7  PROT 5.4* 5.6* 5.7*  ALBUMIN 3.0* 3.0* 3.0*   No results for input(s): LIPASE, AMYLASE in the last 168 hours. No results for input(s): AMMONIA in the last 168 hours. CBC:  Recent Labs Lab 09/06/15 0717 09/07/15 0420 09/11/15 1250 09/12/15 0645  WBC 9.2 9.8 10.7* 9.3  NEUTROABS 5.7 6.5 8.0*  --   HGB 13.2 13.4 14.2 14.7  HCT 39.5 40.3 42.1 42.9  MCV 97.3 98.3 97.5 96.0  PLT 416* 426* 411* 450*   Cardiac Enzymes:  Recent Labs Lab 09/11/15 1605  CKTOTAL 22*   BNP: Invalid input(s): POCBNP CBG:  Recent Labs Lab 09/12/15 0146 09/12/15 0420 09/12/15 0754 09/12/15 0755 09/12/15 0816  GLUCAP 182* 148* 47* 50* 65    No results found for this or any previous visit (from the past 240 hour(s)).   Scheduled Meds: . sodium chloride   Intravenous STAT  . amLODipine  10 mg Oral Daily  . carvedilol  6.25 mg Oral BID WC  . diclofenac  75 mg Oral BID  . FLUoxetine  40 mg Oral Daily  . insulin aspart  0-9 Units  Subcutaneous TID WC  . insulin detemir  5 Units Subcutaneous BID  . levETIRAcetam  250 mg Oral BID  . lisinopril  20 mg Oral Daily  . pantoprazole  40 mg Oral Daily  . sodium chloride  3 mL Intravenous Q12H   Continuous Infusions: . dextrose 5 % and 0.9% NaCl 100 mL/hr at 09/12/15 0013     Jerzie Bieri, DO  [facility] Pager [phone]  If 7PM-7AM, please contact night-coverage [email or web address] Password [email or web address] 09/12/2015, 8:30 AM   LOS: 1 day

## 2015-09-13 DIAGNOSIS — R4182 Altered mental status, unspecified: Secondary | ICD-10-CM | POA: Diagnosis not present

## 2015-09-13 DIAGNOSIS — R471 Dysarthria and anarthria: Secondary | ICD-10-CM | POA: Diagnosis not present

## 2015-09-13 DIAGNOSIS — R4702 Dysphasia: Secondary | ICD-10-CM | POA: Diagnosis not present

## 2015-09-13 DIAGNOSIS — G92 Toxic encephalopathy: Secondary | ICD-10-CM | POA: Diagnosis not present

## 2015-09-13 DIAGNOSIS — F1210 Cannabis abuse, uncomplicated: Secondary | ICD-10-CM | POA: Diagnosis not present

## 2015-09-13 DIAGNOSIS — G9340 Encephalopathy, unspecified: Secondary | ICD-10-CM | POA: Diagnosis not present

## 2015-09-13 LAB — GLUCOSE, CAPILLARY
GLUCOSE-CAPILLARY: 283 mg/dL — AB (ref 65–99)
Glucose-Capillary: 286 mg/dL — ABNORMAL HIGH (ref 65–99)
Glucose-Capillary: 282 mg/dL — ABNORMAL HIGH (ref 65–99)

## 2015-09-13 LAB — CBC
HEMATOCRIT: 40.6 % (ref 36.0–46.0)
HEMOGLOBIN: 13.7 g/dL (ref 12.0–15.0)
MCH: 32.5 pg (ref 26.0–34.0)
MCHC: 33.7 g/dL (ref 30.0–36.0)
MCV: 96.2 fL (ref 78.0–100.0)
Platelets: 421 10*3/uL — ABNORMAL HIGH (ref 150–400)
RBC: 4.22 MIL/uL (ref 3.87–5.11)
RDW: 13.3 % (ref 11.5–15.5)
WBC: 6.1 10*3/uL (ref 4.0–10.5)

## 2015-09-13 LAB — BASIC METABOLIC PANEL
ANION GAP: 11 (ref 5–15)
BUN: 10 mg/dL (ref 6–20)
CHLORIDE: 102 mmol/L (ref 101–111)
CO2: 25 mmol/L (ref 22–32)
Calcium: 8.6 mg/dL — ABNORMAL LOW (ref 8.9–10.3)
Creatinine, Ser: 0.47 mg/dL (ref 0.44–1.00)
GFR calc Af Amer: >60 mL/min (ref >60)
GFR calc non Af Amer: >60 mL/min (ref >60)
GLUCOSE: 295 mg/dL — AB (ref 65–99)
POTASSIUM: 4 mmol/L (ref 3.5–5.1)
Sodium: 138 mmol/L (ref 135–145)

## 2015-09-13 LAB — MAGNESIUM: Magnesium: 1.6 mg/dL — ABNORMAL LOW (ref 1.7–2.4)

## 2015-09-13 MED ORDER — INSULIN DETEMIR 100 UNIT/ML ~~LOC~~ SOLN: 20.0000 [IU] | Freq: Two times a day (BID) | SUBCUTANEOUS | Status: AC

## 2015-09-13 MED ORDER — OXYCODONE-ACETAMINOPHEN 5-325 MG PO TABS: 1.0000 | ORAL_TABLET | Freq: Four times a day (QID) | ORAL | Status: DC | PRN

## 2015-09-13 MED ORDER — INSULIN DETEMIR 100 UNIT/ML ~~LOC~~ SOLN
8.0000 [IU] | Freq: Two times a day (BID) | SUBCUTANEOUS | Status: DC
  Filled 2015-09-13: qty 0.08

## 2015-09-13 MED ORDER — MAGNESIUM OXIDE 400 (241.3 MG) MG PO TABS
400.0000 mg | ORAL_TABLET | Freq: Once | ORAL | Status: AC
  Administered 2015-09-13: 400 mg via ORAL
  Filled 2015-09-13: qty 1

## 2015-09-13 MED ORDER — MAGNESIUM SULFATE 2 GM/50ML IV SOLN
2.0000 g | Freq: Once | INTRAVENOUS | Status: DC
  Filled 2015-09-13: qty 50

## 2015-09-13 MED ORDER — INSULIN DETEMIR 100 UNIT/ML ~~LOC~~ SOLN
9.0000 [IU] | Freq: Two times a day (BID) | SUBCUTANEOUS | Status: DC
  Filled 2015-09-13: qty 0.09

## 2015-09-13 NOTE — Progress Notes (Signed)
Patient will DC to: [facility] Anticipated DC date: 09/13/15 Family notified: Son Transport by: [facility]  CSW signing off.  Cedric Fishman, [place] Social Worker [phone]

## 2015-09-13 NOTE — Progress Notes (Signed)
Called pt's son to inform of pt's discharge. Spoke with Randell Patient of [facility] to give updated report on pt's return to the facility. Pt's iv site was removed. Telebox was removed and cleaned. CCMD was notified of discharge. Awaiting son's arrival to transport pt back to facility. Pt to be taken downstairs via wheelchair to son's car accompanied by a member of the nursing staff. Pt didn't want oxycodone prescription. Provider made aware, prescription tore up and placed in shreds.

## 2015-09-13 NOTE — NC FL2 (Signed)
[place] LEVEL OF CARE SCREENING TOOL     IDENTIFICATION  Patient Name: Margaret Fischer Birthdate: 12-25-1940 Sex: female Admission Date (Current Location): 09/11/2015  [facility] and [place] Number:  Herbalist and Address:  [facility], [place] [address] [place], [place] [address]      Provider Number: [id number]  Attending Physician Name and Address:  Orson Eva, MD  Relative Name and Phone Number:       Current Level of Care: Hospital Recommended Level of Care: [place] Prior Approval Number:    Date Approved/Denied:   PASRR Number: Evangeline Gula, son [phone]  Discharge Plan: SNF    Current Diagnoses: Patient Active Problem List   Diagnosis Date Noted  . Type 2 diabetes mellitus with hyperglycemia ([place]) 09/12/2015  . Dysphasia 09/12/2015  . Lactic acid acidosis   . Dysarthria 09/11/2015  . Elevated lactic acid level 09/11/2015  . Altered mental status 09/11/2015  . Thrombocytosis ([place])   . 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 Drug abuse, marijuana   . 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 Left hip pain   . Chronic diastolic CHF (congestive heart failure) ([place])   . Depression   . Anxiety state   . HLD (hyperlipidemia)   . Subdural hematoma ([place]) 08/28/2015  . Diabetes mellitus type 2, controlled ([place]) 08/31/2013  . Fibromyalgia 08/31/2013  . Acute encephalopathy 08/31/2013  . Chest pain 01/08/2011  . HTN (hypertension) 01/08/2011  . Depressed 01/08/2011    Orientation RESPIRATION BLADDER Height & Weight    Self, Place  Normal Continent, Incontinent (Incontinent at times)   155 lbs.  BEHAVIORAL SYMPTOMS/MOOD NEUROLOGICAL BOWEL NUTRITION STATUS   (N/A)   Continent  (Please see dc summary)  AMBULATORY STATUS COMMUNICATION OF NEEDS Skin     Verbally Surgical wounds (wound on perineum and buttocks; )                       Personal Care Assistance  Level of Assistance  Bathing, Feeding, Dressing           Functional Limitations Info             SPECIAL CARE FACTORS FREQUENCY  PT (By licensed PT)     PT Frequency: 3x/week              Contractures Contractures Info: Not present    Additional Factors Info  Code Status, Allergies, Psychotropic, Insulin Sliding Scale Code Status Info: Full Allergies Info: Epinephrine, Hydromorphone, Latex, Morphine And Related, Phenergan, Succinylcholine Psychotropic Info: Prozac Insulin Sliding Scale Info: insulin aspart (novoLOG) injection 0-9 Units;insulin detemir (LEVEMIR) injection 9 Units       Current Medications (09/13/2015):  This is the current hospital active medication list Current Facility-Administered Medications  Medication Dose Route Frequency Provider Last Rate Last Dose  . acetaminophen (TYLENOL) tablet 650 mg  650 mg Oral Q6H PRN Samella Parr, NP       Or  . acetaminophen (TYLENOL) suppository 650 mg  650 mg Rectal Q6H PRN Samella Parr, NP      . amLODipine (NORVASC) tablet 10 mg  10 mg Oral Daily Romona Curls, RPH   10 mg at 09/13/15 0919  . carvedilol (COREG) tablet 6.25 mg  6.25 mg Oral BID WC Samella Parr, NP   6.25 mg at 09/13/15 0859  . diclofenac (VOLTAREN) EC tablet  75 mg  75 mg Oral BID Samella Parr, NP   75 mg at 09/13/15 0919  . FLUoxetine (PROZAC) capsule 40 mg  40 mg Oral Daily Samella Parr, NP   40 mg at 09/13/15 0919  . hydrALAZINE (APRESOLINE) injection 10 mg  10 mg Intravenous Q6H PRN Samella Parr, NP      . insulin aspart (novoLOG) injection 0-9 Units  0-9 Units Subcutaneous TID WC Orson Eva, MD   5 Units at 09/13/15 1255  . insulin detemir (LEVEMIR) injection 9 Units  9 Units Subcutaneous BID Orson Eva, MD      . lisinopril (PRINIVIL,ZESTRIL) tablet 20 mg  20 mg Oral Daily Romona Curls, RPH   20 mg at 09/13/15 0919  . magnesium sulfate IVPB 2 g 50 mL  2 g Intravenous Once Orson Eva, MD      . ondansetron [facility]) tablet 4 mg   4 mg Oral Q6H PRN Samella Parr, NP       Or  . ondansetron [facility]) injection 4 mg  4 mg Intravenous Q6H PRN Samella Parr, NP      . pantoprazole (PROTONIX) EC tablet 40 mg  40 mg Oral Daily Romona Curls, RPH   40 mg at 09/13/15 0919  . sodium chloride 0.9 % injection 3 mL  3 mL Intravenous Q12H Samella Parr, NP   3 mL at 09/12/15 2207     Discharge Medications: Please see discharge summary for a list of discharge medications.  Relevant Imaging Results:  Relevant Lab Results:   Additional Information SSN:    [id number]  Benard Halsted, LCSWA

## 2015-09-13 NOTE — Clinical Social Work Note (Signed)
Clinical Social Work Assessment  Patient Details  Name: Margaret Fischer [place] MRN: [id number] Date of Birth: 1941-03-11  Date of referral:  09/13/15               Reason for consult:  Facility Placement                Permission sought to share information with:  Facility Sport and exercise psychologist, Family Supports Permission granted to share information::  No  Name::     Herbalist::  [facility]  Relationship::  Son  Sport and exercise psychologist Information:     Housing/Transportation Living arrangements for the past 2 months:  [place] of Information:  Adult Children Patient Interpreter Needed:  None Criminal Activity/Legal Involvement Pertinent to Current Situation/Hospitalization:  No - Comment as needed Significant Relationships:  Adult Children Lives with:  Self Do you feel safe going back to the place where you live?  No Need for family participation in patient care:  Yes (Comment)  Care giving concerns:  CSW consulted regarding discharge planning. CSW spoke to patient's son who is in acceptance with discharge plan back to [facility].   Social Worker assessment / plan:  Patient to discharge back to [facility].  Employment status:  Retired Forensic scientist:  Medicare PT Recommendations:  [place] / Referral to community resources:  [place]  Patient/Family's Response to care:  Patient's son understands plan to discharge back to [place].  Patient/Family's Understanding of and Emotional Response to Diagnosis, Current Treatment, and Prognosis:  No questions or concerns.  Emotional Assessment Appearance:  Appears older than stated age Attitude/Demeanor/Rapport:    Affect (typically observed):  Unable to Assess Orientation:  Oriented to Self, Oriented to Place Alcohol / Substance use:  Not Applicable Psych involvement (Current and /or in the community):  No (Comment)  Discharge Needs  Concerns to be  addressed:  No discharge needs identified Readmission within the last 30 days:  Yes Current discharge risk:  None Barriers to Discharge:  No Barriers Identified   Benard Halsted, [place] 09/13/2015, 4:43 PM

## 2015-09-13 NOTE — Evaluation (Signed)
Physical Therapy Evaluation Patient Details Name: Margaret Fischer MRN: [id number] DOB: December 02, 1940 Today's Date: 09/13/2015   History of Present Illness  Patient is a 75 yo female admitted after an 11 day stay on 09/07/2015. During the hospital stay, the patient was treated for left-sided subdural hematoma resulting in a mechanical fall. In addition, the patient also had respiratory failure requiring intubation during that hospitalization. She has now been returned to the hospital due to concerns regarding encephalopathy as well as increasing dysphasia (CT of the brain revealed a stable left subdural hematoma) Patient with acute encephalopathy.  PMH:  CVA, anxiety, depression, HTN, bradycardia, DM, neuropathy, chest pain, chronic pain  Clinical Impression  Pt admitted with above diagnosis. Pt currently with functional limitations due to the deficits listed below (see PT Problem List).  Pt will benefit from skilled PT to increase their independence and safety with mobility to allow discharge to the venue listed below.  Based upon the patient's current mobility, recommending SNF upon D/C.      Follow Up Recommendations SNF;Supervision/Assistance - 24 hour    Equipment Recommendations  None recommended by PT;Other (comment) (to address at next venue of care)    Recommendations for Other Services       Precautions / Restrictions Precautions Precautions: Fall Restrictions Weight Bearing Restrictions: No      Mobility  Bed Mobility Overal bed mobility: Needs Assistance Bed Mobility: Supine to Sit;Sit to Supine     Supine to sit: Supervision;HOB elevated (rail) Sit to supine: Supervision      Transfers Overall transfer level: Needs assistance Equipment used: Rolling walker (2 wheeled) Transfers: Sit to/from Stand Sit to Stand: Min assist         General transfer comment: Repeating X2 due to posterior loss of balance with initial standing.   Ambulation/Gait Ambulation/Gait  assistance: Min assist Ambulation Distance (Feet): 12 Feet Assistive device: Rolling walker (2 wheeled) Gait Pattern/deviations: Decreased step length - right;Decreased step length - left;Step-through pattern Gait velocity: decreased   General Gait Details: slow pattern but no loss of balance  Stairs            Wheelchair Mobility    Modified Rankin (Stroke Patients Only)       Balance Overall balance assessment: Needs assistance Sitting-balance support: No upper extremity supported Sitting balance-Leahy Scale: Fair     Standing balance support: Bilateral upper extremity supported Standing balance-Leahy Scale: Poor Standing balance comment: using rw                             Pertinent Vitals/Pain Pain Assessment: No/denies pain    Home Living Family/patient expects to be discharged to:: Skilled nursing facility                      Prior Function           Comments: Patient states she is not sure if she was using a device prior to admission     Hand Dominance        Extremity/Trunk Assessment   Upper Extremity Assessment: Generalized weakness           Lower Extremity Assessment: Generalized weakness         Communication   Communication: No difficulties;Other (comment) (soft voice, minimal conversation during session. )  Cognition Arousal/Alertness: Awake/alert Behavior During Therapy: Flat affect Overall Cognitive Status: No family/caregiver present to determine baseline cognitive functioning  General Comments: Patient reporting that she does not remember if she was using a rw prior to admission. Unable to recall why she is in the hospital or where she lives currently.     General Comments General comments (skin integrity, edema, etc.): IV noted to be leaking, nursing notified.     Exercises        Assessment/Plan    PT Assessment Patient needs continued PT services  PT Diagnosis  Difficulty walking;Generalized weakness   PT Problem List Decreased strength;Decreased activity tolerance;Decreased balance;Decreased mobility;Decreased knowledge of use of DME;Decreased safety awareness  PT Treatment Interventions DME instruction;Gait training;Functional mobility training;Therapeutic activities;Therapeutic exercise;Patient/family education   PT Goals (Current goals can be found in the Care Plan section) Acute Rehab PT Goals Patient Stated Goal: not expressed PT Goal Formulation: With patient Time For Goal Achievement: 09/27/15 Potential to Achieve Goals: Good    Frequency Min 2X/week   Barriers to discharge        Co-evaluation               End of Session Equipment Utilized During Treatment: Gait belt Activity Tolerance: Patient tolerated treatment well Patient left: in bed;with call bell/phone within reach;with bed alarm set Nurse Communication: Mobility status    Functional Assessment Tool Used: clinical judgment Functional Limitation: Mobility: Walking and moving around Mobility: Walking and Moving Around Current Status [id number]): At least 60 percent but less than 80 percent impaired, limited or restricted Mobility: Walking and Moving Around Goal Status [phone]): At least 40 percent but less than 60 percent impaired, limited or restricted    Time: [id number] PT Time Calculation (min) (ACUTE ONLY): 23 min   Charges:   PT Evaluation $PT Eval Moderate Complexity: 1 Procedure PT Treatments $Therapeutic Activity: 8-22 mins   PT G Codes:   PT G-Codes **NOT FOR INPATIENT CLASS** Functional Assessment Tool Used: clinical judgment Functional Limitation: Mobility: Walking and moving around Mobility: Walking and Moving Around Current Status [id number]): At least 60 percent but less than 80 percent impaired, limited or restricted Mobility: Walking and Moving Around Goal Status [phone]): At least 40 percent but less than 60 percent impaired, limited or restricted     Cassell Clement, PT, [place] Pager [phone] Office [phone]  09/13/2015, 1:02 PM

## 2015-09-13 NOTE — Progress Notes (Signed)
Speech Language Pathology Treatment: Dysphagia  Patient Details Name: Margaret Fischer MRN: [id number] DOB: Jul 17, 1941 Today's Date: 09/13/2015 Time: 5072-2575 SLP Time Calculation (min) (ACUTE ONLY): 25 min  Assessment / Plan / Recommendation Clinical Impression  Today pt with much improved communication, verbal expression today.  Pt able to self feed with adequate "mastication" of graham cracker without residuals. Pt states she does not wear dentures consistently with po intake.   Cough x2 noted during entire session - pt with h/o esophageal dysmotility and reflux per testing 2008.   Pt also able to self feed juice, water via straw and icecream.  Suspect swallow ability is at baseline.  Will advance diet to dys3/thin and sign off.  Educated pt to need to take small bites/sips and drink t/o meal due to h/o dysmotility and reflux.  Pt reported understanding to information reviewed however uncertain to ability to generalize.     HPI HPI: 75 yo female former smoker presented with altered mental status, dysarthria.  Pt with recent Left SDH, lactic acidosis, fever and hyperglycemia requiring intubation 1/8-1/11. Pt for swallow evaluation per orders.  She does have h/o esophageal dysmotility and reflux per 2008 esophagram.  EEG negative and today pt articulating much better than yesterda!      SLP Plan  All goals met     Recommendations  Diet recommendations: Dysphagia 3 (mechanical soft);Thin liquid Liquids provided via: Straw;Cup Medication Administration: Whole meds with puree (as tolerated) Supervision: Patient able to self feed Compensations: Small sips/bites;Slow rate (drink liquids t/o meal) Postural Changes and/or Swallow Maneuvers: Seated upright 90 degrees             Oral Care Recommendations: Oral care BID Follow up Recommendations: None Plan: All goals met     GO           Functional Assessment Tool Used: clinical judgement Functional Limitations: Swallowing Swallow Current  Status ([id number]): At least 1 percent but less than 20 percent impaired, limited or restricted Swallow Goal Status [phone]): At least 1 percent but less than 20 percent impaired, limited or restricted Swallow Discharge Status [phone]): At least 1 percent but less than 20 percent impaired, limited or restricted    Luanna Salk, [place] [facility] SLP [phone]

## 2015-09-13 NOTE — Discharge Summary (Signed)
Physician Discharge Summary  Margaret Fischer [id number] DOB: 03-Dec-1940 DOA: 09/11/2015  PCP: Delia Chimes, NP  Admit date: 09/11/2015 Discharge date: 09/13/2015  Recommendations for Outpatient Follow-up:  1. Pt will need to follow up with PCP in 2 weeks post discharge 2. Please obtain BMP on 09/19/15 3. Please continue to check CBGs q ac/hs and adjust levemir as necessary Discharge Diagnoses:  Acute encephalopathy -According to the patient's son, the patient is improved, but not yet at her baseline -Biggest concern remains the patient's dysphasia and dysarthria which the son states is worse than her last hospitalization -09/11/2015 MRI brain--negative for acute findings; stable midline shift compared to 09/02/2015 -09/12/2015 a.m.--patient was noted to be hypoglycemic with CBG 48--unclear if the patient has had transient hypoglycemia in the recent past -Decreased Levemir dosing -discharge with Levemir 20 units bid -Check for other reversible causes of encephalopathy-->B12, TSH, Ammonia--all unremarkable -patient's Keppra was partly contributing to the patient's encephalopathy--defer any changes to neurology -discussed Beer with neurology--d/c AEDs if EEG was neg -EEG--no epileptiform predisposition-->d/c Keppra -09/13/15--mental status at baseline per son at bedside -Urinalysis is negative for pyuria -CPK 22 -EKG sinus rhythm with nonspecific ST changes -patient's residual dysphasia and dysarthria is due to SDH Subdural hematoma -Stable per CT and MRI imaging as discussed above -Neurology has been consulted regarding the need for continued seizure prophylaxis -pt has outpt appt with Dr. Cyndy Freeze in 2-3 weeks Diabetes mellitus type 2, uncontrolled -09/04/2015 and hemoglobin A1c 11.1 -Adjust the patient's Levemir due to hypoglycemia -discharge with Levemir 20 units bid -NovoLog sliding scale Dysphagia -Speech therapy evaluation-->dysphagia 3 with thin liquids Hypertension -Continue  amlodipine, lisinopril, carvedilol Lactic acidosis  -The patient is afebrile and hemodynamically stable  -Likely due to volume depletion  -Improved with fluid resuscitation  -metformin may have contributed  Thrombocytosis  -Likely acute phase reactant  -Check iron studies--iron sat 35%, ferritin 249 anxiety/depression  -hold klonopin in setting of encephalopathy -continue fluoxetine Hypokalemia -repleted -check mag--1.6-->repleted Hx of THC use -counseled  Discharge Condition: stable  Disposition: SNF Follow-up Information    Follow up with [facility] .   Specialty:  [place] information:   [address] [place] [phone]    SNF  Diet:dysphagia 3 with thin liquids Wt Readings from Last 3 Encounters:  09/13/15 67.3 kg (148 lb 5.9 oz)  09/07/15 72.8 kg (160 lb 7.9 oz)  12/07/13 82.7 kg (182 lb 5.1 oz)    History of present illness:  75 year old female with a history of diabetes mellitus, hypertension, hyperlipidemia, chronic back pain who was recently discharged from the hospital after an 11 day stay on 09/07/2015. During the hospital stay, the patient was treated for left-sided subdural hematoma resulting in a mechanical fall. In addition, the patient also had respiratory failure requiring intubation during that hospitalization. She was evaluated by speech therapy prior to discharge and was placed on a regular diet. He was also noted that the patient was able to phonate although she had a very soft voice. The patient was discharged to a skilled nursing facility. Apparently there was concern for some encephalopathy as well as increasing dysphasia at the nursing facility. As a result, the patient was brought to the hospital for further evaluation. Evaluation in the emergency department including CT of the brain revealed a stable left subdural hematoma.  MR brain neg for acute stroke or other acute  findings.  Neurology was consulted and ordered EEG which was neg for seizure predisposition.  As a result, Keppra was discontinued.  The pt's mental status improved.    Consultants: neurology  Discharge Exam: Filed Vitals:   09/13/15 0525 09/13/15 0850  BP: 154/55 153/60  Pulse: 61   Temp: 98.1 F (36.7 C)   Resp: 16    Filed Vitals:   09/12/15 2223 09/13/15 0422 09/13/15 0525 09/13/15 0850  BP: 145/48  154/55 153/60  Pulse: 66  61   Temp: 98.6 F (37 C)  98.1 F (36.7 C)   TempSrc: Oral  Oral   Resp: 16  16   Weight:  67.3 kg (148 lb 5.9 oz)    SpO2: 95%  100%    General: A&O x 3, NAD, pleasant, cooperative Cardiovascular: RRR, no rub, no gallop, no S3 Respiratory: CTAB, no wheeze, no rhonchi Abdomen:soft, nontender, nondistended, positive bowel sounds Extremities: No edema, No lymphangitis, no petechiae  Discharge Instructions      Discharge Instructions    Diet - low sodium heart healthy    Complete by:  As directed      Increase activity slowly    Complete by:  As directed             Medication List    STOP taking these medications        fluconazole 100 MG tablet  Commonly known as:  DIFLUCAN     levETIRAcetam 500 MG tablet  Commonly known as:  KEPPRA      TAKE these medications        amLODipine 10 MG tablet  Commonly known as:  NORVASC  Take 1 tablet (10 mg total) by mouth daily.     benzonatate 100 MG capsule  Commonly known as:  TESSALON  Take 1 capsule (100 mg total) by mouth 3 (three) times daily as needed for cough.     carvedilol 6.25 MG tablet  Commonly known as:  COREG  Take 1 tablet (6.25 mg total) by mouth 2 (two) times daily with a meal.     cholecalciferol 1000 units tablet  Commonly known as:  VITAMIN D  Take 1,000 Units by mouth daily.     clonazePAM 0.5 MG tablet  Commonly known as:  KLONOPIN  Take 1 tablet (0.5 mg total) by mouth 2 (two) times daily.     cyanocobalamin 500 MCG tablet  Take 500 mcg by mouth daily.       diclofenac 75 MG EC tablet  Commonly known as:  VOLTAREN  Take 75 mg by mouth 2 (two) times daily.     FLUoxetine 40 MG capsule  Commonly known as:  PROZAC  Take 1 capsule (40 mg total) by mouth daily.     glucosamine-chondroitin 500-400 MG tablet  Take 1 tablet by mouth 2 (two) times daily.     insulin aspart 100 UNIT/ML injection  Commonly known as:  novoLOG  Sliding scale  CBG 70 - 120: 0 units: CBG 121 - 150: 2 units; CBG 151 - 200: 3 units; CBG 201 - 250: 5 units; CBG 251 - 300: 8 units;CBG 301 - 350: 11 units; CBG 351 - 400: 15 units; CBG > 400 : 15 units and notify MD     insulin detemir 100 UNIT/ML injection  Commonly known as:  LEVEMIR  Inject 0.2 mLs (20 Units total) into the skin 2 (two) times daily.     lisinopril 20 MG tablet  Commonly known as:  PRINIVIL,ZESTRIL  Take 1 tablet (20 mg total) by mouth daily.  metFORMIN 1000 MG tablet  Commonly known as:  GLUCOPHAGE  Take 1,000 mg by mouth 2 (two) times daily with a meal.     NITROSTAT 0.4 MG SL tablet  Generic drug:  nitroGLYCERIN  Place 0.4 mg under the tongue every 5 (five) minutes as needed for chest pain.     nystatin 100000 UNIT/ML suspension  Commonly known as:  MYCOSTATIN  Use as directed 5 mLs (500,000 Units total) in the mouth or throat 4 (four) times daily.     oxyCODONE-acetaminophen 5-325 MG tablet  Commonly known as:  PERCOCET/ROXICET  Take 1 tablet by mouth every 6 (six) hours as needed for moderate pain.     pantoprazole 40 MG tablet  Commonly known as:  PROTONIX  Take 1 tablet (40 mg total) by mouth daily.     pravastatin 40 MG tablet  Commonly known as:  PRAVACHOL  Take 1 tablet (40 mg total) by mouth daily.         The results of significant diagnostics from this hospitalization (including imaging, microbiology, ancillary and laboratory) are listed below for reference.    Significant Diagnostic Studies: Dg Chest 2 View  09/11/2015  CLINICAL DATA:  Altered mental status,  recent UTI EXAM: CHEST  2 VIEW COMPARISON:  08/31/2015 FINDINGS: Cardiomediastinal silhouette is stable. No acute infiltrate or pleural effusion. No pulmonary edema. Mild elevation of the right hemidiaphragm. Mild degenerative changes lower thoracic spine. IMPRESSION: No active cardiopulmonary disease. Electronically Signed   By: Lahoma Crocker M.D.   On: 09/11/2015 13:59   Dg Abd 1 View  08/30/2015  CLINICAL DATA:  Nausea, vomiting. EXAM: ABDOMEN - 1 VIEW COMPARISON:  08/28/2015 FINDINGS: The bowel gas pattern is normal. No radio-opaque calculi or other significant radiographic abnormality are seen. Enteric catheter overlies the expected location of the gastric body. No radiographic evidence of organomegaly.Cholecystectomy clips are noted. Rectal probe is in place. Postsurgical clips overlie the right groin. IMPRESSION: Nonobstructive bowel gas pattern. Enteric catheter with distal tip at the expected location of gastric body. Electronically Signed   By: Fidela Salisbury M.D.   On: 08/30/2015 16:29   Ct Head Wo Contrast  09/11/2015  CLINICAL DATA:  74 year old female with altered mental status (not otherwise specified at the time of this report). Recent left subdural hematoma. Initial encounter. EXAM: CT HEAD WITHOUT CONTRAST TECHNIQUE: Contiguous axial images were obtained from the base of the skull through the vertex without intravenous contrast. COMPARISON:  09/02/2015 and earlier FINDINGS: Interval improved sphenoid sinus aeration with resolved bubbly opacity. Otherwise stable and largely clear paranasal sinuses, chronic appearing left orbital floor fracture. No acute osseous abnormality identified. Stable orbit and scalp soft tissues. Mixed density left subdural hematoma, more isodense than on 09/02/2015. Hematoma thickness up to 9-10 mm today, stable. Rightward midline shift of 5 mm also is stable. Mild effacement of the left lateral ventricle is stable. No ventriculomegaly. Basilar cisterns remain  patent. No new intracranial hemorrhage identified. No cortically based acute infarct identified. Stable gray-white matter differentiation. IMPRESSION: 1. Stable size of the left subdural hematoma since 09/02/2015 with 5 mm of rightward midline shift. Blood products now are more isodense. 2. No new intracranial abnormality identified. Electronically Signed   By: Genevie Ann M.D.   On: 09/11/2015 12:42   Ct Head Wo Contrast  09/02/2015  CLINICAL DATA:  Followup subdural hematoma. EXAM: CT HEAD WITHOUT CONTRAST TECHNIQUE: Contiguous axial images were obtained from the base of the skull through the vertex without intravenous contrast. COMPARISON:  Prior study from 08/28/2015. FINDINGS: Left holo hemispheric subdural hematoma again seen. Hematoma measures up to 1 cm in maximal thickness, decreased from prior. Small amount of hemorrhage extends along the falx as well. 4-5 mm of left-to-right shift. Mild edema with sulcal effacement within the left cerebral hemisphere. No hydrocephalus or evidence of ventricular trapping. No intraventricular blood are definite subarachnoid hemorrhage. No acute large vessel territory infarct. Atrophy with chronic small vessel ischemic disease again noted. Vascular calcifications within the carotid siphons. Left parietal scalp contusion present. No acute abnormality about the orbits. Exophthalmos is stable. Left orbital floor fracture noted, better evaluated on prior maxillofacial CT. Increase mucosal thickening throughout the ethmoidal air cells and sphenoid sinuses as compared to previous. No mastoid effusion. Calvarium intact. IMPRESSION: 1. Slightly decreased size of left subdural hematoma, now measuring up to 1 cm in maximal thickness. 2. 4-5 mm of left-to-right shift. No hydrocephalus or evidence of ventricular trapping. 3. No other new intracranial process. Electronically Signed   By: Jeannine Boga M.D.   On: 09/02/2015 06:57   Ct Head Wo Contrast  08/29/2015  CLINICAL DATA:   Follow-up LEFT subdural hematoma, altered mental status. Struck head yesterday. EXAM: CT HEAD WITHOUT CONTRAST TECHNIQUE: Contiguous axial images were obtained from the base of the skull through the vertex without intravenous contrast. COMPARISON:  CT head August 28, 2015 FINDINGS: LEFT holo hemispheric dense acute subdural hematoma measuring up to 12 mm with resultant 2 mm LEFT-to-RIGHT midline shift. 2 mm LEFT parafalcine subdural hematoma with trace subarachnoid hemorrhage along the interhemispheric fissure, RIGHT frontal sulci. No ventricular entrapment or hydrocephalus. No intraparenchymal hemorrhage. No acute large vascular territory infarct. Basal cisterns are patent. Moderate calcific atherosclerosis of the carotid siphons. Small LEFT maxillary sinus air-fluid level. Suspected LEFT orbital floor fracture may be acute. Mastoid air cells are well aerated. No skull fracture. IMPRESSION: Similar appearance of acute 12 mm holo hemispheric subdural hematoma resulting in 2 mm LEFT-to-RIGHT midline shift. 2 mm parafalcine subdural hematoma with small amount of subarachnoid hemorrhage. Suspected LEFT orbital floor fracture may be acute. Recommend dedicated maxillofacial CT. Electronically Signed   By: Elon Alas M.D.   On: 08/29/2015 05:03   Ct Head Wo Contrast  08/28/2015  CLINICAL DATA:  Followup subdural hematoma. EXAM: CT HEAD WITHOUT CONTRAST TECHNIQUE: Contiguous axial images were obtained from the base of the skull through the vertex without intravenous contrast. COMPARISON:  03/11/2014 FINDINGS: There is a large left subdural hematoma measuring 13 mm in maximum thickness, image 17 of series 201. This extends anteriorly over the left frontal lobe with a parafalcine component measuring 3 mm in thickness. The ventricular volumes appear normal. No hydrocephalus or intracranial hemorrhage. There is mild left-to-right midline shift of 2 mm. Prominence of the sulci identified compatible with brain atrophy.  There is mild diffuse low attenuation within the subcortical and periventricular white matter compatible with chronic microvascular disease.Fluid level is noted within the left maxillary sinus. The mastoid air cells are clear. The calvarium is intact. IMPRESSION: 1. Acute left subdural hematoma measuring 13 mm and causing approximately 2 mm of left-to-right midline shift. 2. Small vessel ischemic change and brain atrophy. Electronically Signed   By: Kerby Moors M.D.   On: 08/28/2015 15:11   Ct Head Wo Contrast  08/28/2015  CLINICAL DATA:  75 year old female with altered mental status and agitated EXAM: CT HEAD WITHOUT CONTRAST TECHNIQUE: Contiguous axial images were obtained from the base of the skull through the vertex without intravenous contrast. COMPARISON:  Prior head CT 03/11/2014 FINDINGS: Acute crescentic extra-axial high attenuation fluid overlying the left cerebral convexity consistent with acute subdural hematoma. The maximal depth at the mid temporal lobe is approximately 13 mm. Additionally, there is likely some focal subdural blood overlying the right frontal lobe as well although this is in a region of combined streak and beam hardening artifact. No evidence of mass, mass effect, hydrocephalus or acute infarct. No intraventricular or subarachnoid hemorrhage visualized. Approximately 4 mm left-to-right midline shift. Motion artifact slightly limits evaluation for subtle calvarial fracture. No definite displaced calvarial fracture identified. Grossly normally aerated mastoid air cells and paranasal sinuses save for a small amount of hemo sinus layering in the left maxillary sinus. Incompletely imaged soft tissue contusion overlying the left maxillary antrum. There is slight irregularity of the inferior wall of the left orbit. Fracture is not excluded. IMPRESSION: 1. Acute subdural hematoma overlying the left cerebral convex [place] measuring up to 13 mm in width. There is associated local mass effect  with 4 mm left-to-right midline shift. 2. Additional small volume subdural hematoma versus artifact overlying the right frontal lobe. 3. Incompletely imaged soft tissue contusion overlying the left maxillary antrum with associated small volume left maxillary hemo sinus. 4. Question of possible left orbital floor fracture which is incompletely imaged. Recommend further evaluation with dedicated CT scan of the face when the patient is able to cooperate and there is less potential for severe motion artifact. Critical Value/emergent results were called by telephone at the time of interpretation on 08/28/2015 at 11:55 am to Dr. Pattricia Boss , who verbally acknowledged these results. Electronically Signed   By: Jacqulynn Cadet M.D.   On: 08/28/2015 11:56   Mr Brain Wo Contrast  09/11/2015  CLINICAL DATA:  75 year old hypertensive diabetic female with altered mental status. Recent left subdural hematoma. Subsequent encounter. EXAM: MRI HEAD WITHOUT CONTRAST TECHNIQUE: Multiplanar, multiecho pulse sequences of the brain and surrounding structures were obtained without intravenous contrast. COMPARISON:  09/11/2015 and 09/02/2015 head CT. 09/12/2011 brain MR. FINDINGS: Sequences are motion degraded No acute thrombotic infarct. Large broad-based left subdural hematoma with maximal thickness of 12 mm versus 09/01/2005 maximal thickness of 9.5 mm. Mass effect upon the left lateral ventricle with 4.4 mm midline shift to the right without significant change compared to 09/02/2015. Minimal small vessel disease type changes. Baseline atrophy. No intracranial mass lesion seen separate from above described findings. Major intracranial vascular structures are patent. Post left lens replacement. Exophthalmos. Symmetric normal extra-ocular muscles. Left orbital floor fracture. Transverse ligament hypertrophy with mild narrowing ventral thecal sac. Cervical medullary junction unremarkable. Minimal partial opacification mastoid air  cells. IMPRESSION: Sequences are motion degraded No acute thrombotic infarct. Large broad-based left subdural hematoma with maximal thickness of 12 mm versus 09/01/2005 maximal thickness of 9.5 mm. Mass effect upon the left lateral ventricle with 4.4 mm midline shift to the right without significant change compared to 09/02/2015. Minimal small vessel disease type changes. Baseline atrophy. No intracranial mass lesion seen separate from above described findings. Post left lens replacement. Exophthalmos. Symmetric normal extra-ocular muscles. Left orbital floor fracture (noted on the 08/29/2015 exam). Electronically Signed   By: Genia Del M.D.   On: 09/11/2015 17:43   Dg Chest Port 1 View  08/31/2015  CLINICAL DATA:  Respiratory failure EXAM: PORTABLE CHEST 1 VIEW COMPARISON:  08/30/2015 FINDINGS: Endotracheal tube tip 5.3 cm above the carina. Nasogastric tube enters the stomach. Borderline enlargement of the cardiopericardial silhouette with atherosclerotic calcification of the aortic arch. Blunted  left costophrenic angle. Improved aeration at the right lung base. IMPRESSION: 1. Blunting of the left costophrenic angle-left pleural effusion not excluded. 2. Improved aeration at the right lung base. 3. Otherwise stable. Electronically Signed   By: Van Clines M.D.   On: 08/31/2015 07:17   Dg Chest Port 1 View  08/30/2015  CLINICAL DATA:  75 year old female currently intubated EXAM: PORTABLE CHEST 1 VIEW COMPARISON:  Prior chest x-ray 08/29/2015 FINDINGS: The patient is intubated. The tip of the endotracheal tube is 4.8 cm above the carina. A nasogastric tube is present, the tip lies below the diaphragm off the field of view but likely within the stomach. Inspiratory volumes remain low. Improved aeration of the left lung base likely secondary to decreasing atelectasis. Persistent patchy airspace opacity in the right lung base. No large pleural effusion or pneumothorax. No suspicious nodule or mass. No  acute osseous abnormality. IMPRESSION: 1. Stable and satisfactory support apparatus. 2. Decreasing atelectasis in the left lung base. 3. Persistent patchy airspace opacity in the right lung base which may reflect atelectasis or infiltrate. Electronically Signed   By: Jacqulynn Cadet M.D.   On: 08/30/2015 07:46   Dg Chest Port 1 View  08/29/2015  CLINICAL DATA:  75 year old female with respiratory failure. Subsequent encounter. EXAM: PORTABLE CHEST 1 VIEW COMPARISON:  08/28/2015. FINDINGS: Endotracheal tube tip 4 cm above the carina. Nasogastric tube courses below the diaphragm. Tip is not included on the present exam. Cardiomegaly. Pulmonary vascular prominence most notable centrally. Basilar subsegmental atelectasis with poor inspiration. Basilar infiltrate secondary less likely consideration. No gross pneumothorax. Calcified aorta is slightly tortuous. IMPRESSION: Poor inspiratory portable exam with pulmonary vascular prominence most notable centrally. Basilar subsegmental atelectasis. Nasogastric tube courses below the diaphragm. Tip is not included on the present exam. Electronically Signed   By: Genia Del M.D.   On: 08/29/2015 08:05   Dg Chest Portable 1 View  08/28/2015  CLINICAL DATA:  Patient status post intubation. Loss of consciousness. EXAM: PORTABLE CHEST 1 VIEW COMPARISON:  Chest radiograph 01/27/2015. FINDINGS: ET tube terminates in the mid trachea. Stable cardiomegaly. Minimal heterogeneous opacities left lung base. No pleural effusion or pneumothorax. Old left lateral rib fracture. IMPRESSION: ET tube terminates in mid trachea. Probable left basilar atelectasis. Electronically Signed   By: Lovey Newcomer M.D.   On: 08/28/2015 10:39   Dg Abd Portable 1v  08/28/2015  CLINICAL DATA:  Encounter for orogastric (OG) tube placement EXAM: PORTABLE ABDOMEN - 1 VIEW COMPARISON:  None. FINDINGS: OG tube in the left upper quadrant. Its position suggests it lies along the greater curvature with tip in  the distal body. IMPRESSION: OG tube as described. Electronically Signed   By: Skipper Cliche M.D.   On: 08/28/2015 19:23   Dg Hip Unilat With Pelvis 2-3 Views Left  09/03/2015  CLINICAL DATA:  Pain following fall 1 week prior EXAM: DG HIP (WITH OR WITHOUT PELVIS) 2-3V LEFT COMPARISON:  None. FINDINGS: Frontal pelvis as well as frontal and lateral left hip images were obtained. Bones are diffusely osteoporotic. There is no demonstrable fracture or dislocation. There is moderate symmetric narrowing of both hip joints. No erosive change. There are surgical clips in the right inguinal region. IMPRESSION: Moderate symmetric narrowing of both hip joints. Diffuse osteoporosis. No acute fracture or dislocation. Electronically Signed   By: Lowella Grip III M.D.   On: 09/03/2015 19:12   Ct Maxillofacial Wo Cm  08/29/2015  CLINICAL DATA:  Patient came in yesterday with AMS, SDH  Scan showed possible left orbital fracture. Left eye is swollen and bruised EXAM: CT MAXILLOFACIAL WITHOUT CONTRAST TECHNIQUE: Multidetector CT imaging of the maxillofacial structures was performed. Multiplanar CT image reconstructions were also generated. A small metallic BB was placed on the right temple in order to reliably differentiate right from left. COMPARISON:  Head CT, 08/29/2015 at 4:45 a.m. FINDINGS: There is a depressed comminuted fracture of the left orbital floor. Fracture is depressed 9 mm. Oral fat extends through the defect. There is no entrapment of the inferior rectus muscle. There is mild left lateral periorbital soft tissue edema. Patient has had a previous left cataract surgery. Left globe otherwise unremarkable. No abnormality of the postseptal left orbit. There are no other fractures. Dependent fluid is seen in the left maxillary sinus. Sinuses otherwise clear. Clear mastoid air cells and middle ear cavities. There are indwelling nasogastric and endotracheal tubes. No neck masses or adenopathy. IMPRESSION: 1.  Depressed, comminuted left orbital floor fracture associated with dependent hemorrhage in the left maxillary sinus. No extraocular muscle entrapment. No other fractures. Electronically Signed   By: Lajean Manes M.D.   On: 08/29/2015 13:51     Microbiology: No results found for this or any previous visit (from the past 240 hour(s)).   Labs: Basic Metabolic Panel:  Recent Labs Lab 09/07/15 0420 09/11/15 1250 09/12/15 0645 09/12/15 1520 09/13/15 0550  NA 137 140 142  --  138  K 4.0 4.1 3.0*  --  4.0  CL 100* 103 108  --  102  CO2 28 25 23   --  25  GLUCOSE 220* 111* 81  --  295*  BUN 12 16 9   --  10  CREATININE 0.48 0.49 0.43*  --  0.47  CALCIUM 8.7* 9.5 9.0  --  8.6*  MG 1.9  --   --  1.7 1.6*   Liver Function Tests:  Recent Labs Lab 09/07/15 0420 09/11/15 1250  AST 21 21  ALT 11* 8*  ALKPHOS 93 96  BILITOT 1.2 0.7  PROT 5.6* 5.7*  ALBUMIN 3.0* 3.0*   No results for input(s): LIPASE, AMYLASE in the last 168 hours.  Recent Labs Lab 09/12/15 1520  AMMONIA 30   CBC:  Recent Labs Lab 09/07/15 0420 09/11/15 1250 09/12/15 0645 09/13/15 0550  WBC 9.8 10.7* 9.3 6.1  NEUTROABS 6.5 8.0*  --   --   HGB 13.4 14.2 14.7 13.7  HCT 40.3 42.1 42.9 40.6  MCV 98.3 97.5 96.0 96.2  PLT 426* 411* 450* 421*   Cardiac Enzymes:  Recent Labs Lab 09/11/15 1605  CKTOTAL 22*   BNP: Invalid input(s): POCBNP CBG:  Recent Labs Lab 09/12/15 0816 09/12/15 1635 09/12/15 2149 09/13/15 0813 09/13/15 1212  GLUCAP 65 270* 305* 286* 283*    Time coordinating discharge:  Greater than 30 minutes  Signed:  Michio Thier, DO [facility] Pager: [phone] 09/13/2015, 3:54 PM

## 2015-09-13 NOTE — Progress Notes (Signed)
Inpatient Diabetes Program Recommendations  AACE/ADA: New Consensus Statement on Inpatient Glycemic Control (2015)  Target Ranges:  Prepandial:   less than 140 mg/dL      Peak postprandial:   less than 180 mg/dL (1-2 hours)      Critically ill patients:  140 - 180 mg/dL   Results for Mondry, RADWA FELLINGER (MRN [id number]) as of 09/13/2015 09:37  Ref. Range 09/12/2015 16:35 09/12/2015 21:49 09/13/2015 08:13  Glucose-Capillary Latest Ref Range: 65-99 mg/dL 270 (H) 305 (H) 286 (H)   Review of Glycemic Control  Diabetes history: DM 2 Outpatient Diabetes medications: Levemir 30 BID, Novolog Moderate, Metformin 1,000 mg BID Current orders for Inpatient glycemic control: Levemir 5 units BID, Novolog Sensitive TID  Inpatient Diabetes Program Recommendations: Insulin - Basal: Fasting glucose 286 this am. Please consider increasing Levemir to 8 units BID.  Thanks,  Tama Headings RN, MSN, [facility] Inpatient Diabetes Coordinator Team Pager [phone] (8a-5p)

## 2015-09-14 NOTE — Progress Notes (Addendum)
[employer] [facility] and discussed with patients nurse --Norval Gable she be placed on Depakote 250 mg BID, Keppra be stopped and she should have a repeat EEG as out patient in 2-3 weeks.  This information was taken down and given to NP at facility. Also discussed with son who was accepting of information.   Etta Quill PA-C [facility] [phone]  09/14/2015, 10:34 AM

## 2015-10-18 ENCOUNTER — Other Ambulatory Visit: Payer: Self-pay | Admitting: Neurological Surgery

## 2015-10-18 DIAGNOSIS — S065XAA Traumatic subdural hemorrhage with loss of consciousness status unknown, initial encounter: Secondary | ICD-10-CM

## 2015-10-18 DIAGNOSIS — S065X9A Traumatic subdural hemorrhage with loss of consciousness of unspecified duration, initial encounter: Secondary | ICD-10-CM

## 2015-10-31 ENCOUNTER — Ambulatory Visit
Admission: RE | Admit: 2015-10-31 | Discharge: 2015-10-31 | Disposition: A | Discharge status: Home / Self Care | Payer: Medicare Other | Source: Ambulatory Visit | Attending: Neurological Surgery | Admitting: Neurological Surgery

## 2015-10-31 DIAGNOSIS — S065X9A Traumatic subdural hemorrhage with loss of consciousness of unspecified duration, initial encounter: Secondary | ICD-10-CM

## 2015-10-31 DIAGNOSIS — S065XAA Traumatic subdural hemorrhage with loss of consciousness status unknown, initial encounter: Secondary | ICD-10-CM

## 2016-04-23 ENCOUNTER — Emergency Department
Admission: EM | Admit: 2016-04-23 | Discharge: 2016-04-23 | Disposition: A | Discharge status: Home / Self Care | Payer: Medicare Other | Attending: Emergency Medicine | Admitting: Emergency Medicine

## 2016-04-23 DIAGNOSIS — Z794 Long term (current) use of insulin: Secondary | ICD-10-CM | POA: Diagnosis not present

## 2016-04-23 DIAGNOSIS — N390 Urinary tract infection, site not specified: Secondary | ICD-10-CM | POA: Insufficient documentation

## 2016-04-23 DIAGNOSIS — E119 Type 2 diabetes mellitus without complications: Secondary | ICD-10-CM | POA: Diagnosis not present

## 2016-04-23 DIAGNOSIS — Z79899 Other long term (current) drug therapy: Secondary | ICD-10-CM | POA: Diagnosis not present

## 2016-04-23 DIAGNOSIS — Z791 Long term (current) use of non-steroidal anti-inflammatories (NSAID): Secondary | ICD-10-CM | POA: Diagnosis not present

## 2016-04-23 DIAGNOSIS — Z7984 Long term (current) use of oral hypoglycemic drugs: Secondary | ICD-10-CM | POA: Insufficient documentation

## 2016-04-23 DIAGNOSIS — R300 Dysuria: Secondary | ICD-10-CM

## 2016-04-23 DIAGNOSIS — Z87891 Personal history of nicotine dependence: Secondary | ICD-10-CM | POA: Diagnosis not present

## 2016-04-23 DIAGNOSIS — I5032 Chronic diastolic (congestive) heart failure: Secondary | ICD-10-CM | POA: Diagnosis not present

## 2016-04-23 DIAGNOSIS — I110 Hypertensive heart disease with heart failure: Secondary | ICD-10-CM | POA: Diagnosis not present

## 2016-04-23 LAB — CBC
HEMATOCRIT: 45.8 % (ref 35.0–47.0)
Hemoglobin: 16 g/dL (ref 12.0–16.0)
MCH: 32.7 pg (ref 26.0–34.0)
MCHC: 34.9 g/dL (ref 32.0–36.0)
MCV: 93.7 fL (ref 80.0–100.0)
PLATELETS: 259 10*3/uL (ref 150–440)
RBC: 4.9 MIL/uL (ref 3.80–5.20)
RDW: 13.1 % (ref 11.5–14.5)
WBC: 9.2 10*3/uL (ref 3.6–11.0)

## 2016-04-23 LAB — URINALYSIS COMPLETE WITH MICROSCOPIC (ARMC ONLY)
BACTERIA UA: NONE SEEN
Specific Gravity, Urine: 1.025 (ref 1.005–1.030)

## 2016-04-23 LAB — COMPREHENSIVE METABOLIC PANEL
ALBUMIN: 4.2 g/dL (ref 3.5–5.0)
ALT: 16 U/L (ref 14–54)
AST: 30 U/L (ref 15–41)
Alkaline Phosphatase: 78 U/L (ref 38–126)
Anion gap: 7 (ref 5–15)
BUN: 21 mg/dL — AB (ref 6–20)
CHLORIDE: 103 mmol/L (ref 101–111)
CO2: 27 mmol/L (ref 22–32)
CREATININE: 0.47 mg/dL (ref 0.44–1.00)
Calcium: 9.4 mg/dL (ref 8.9–10.3)
GFR calc Af Amer: >60 mL/min (ref >60)
GFR calc non Af Amer: >60 mL/min (ref >60)
GLUCOSE: 111 mg/dL — AB (ref 65–99)
POTASSIUM: 4.4 mmol/L (ref 3.5–5.1)
Sodium: 137 mmol/L (ref 135–145)
Total Bilirubin: 0.7 mg/dL (ref 0.3–1.2)
Total Protein: 6.7 g/dL (ref 6.5–8.1)

## 2016-04-23 MED ORDER — FOSFOMYCIN TROMETHAMINE 3 G PO PACK
3.0000 g | PACK | ORAL | Status: AC
  Administered 2016-04-23: 3 g via ORAL
  Filled 2016-04-23: qty 3

## 2016-04-23 MED ORDER — FOSFOMYCIN TROMETHAMINE 3 G PO PACK: 3.0000 g | PACK | Freq: Once | ORAL | 0 refills | Status: AC

## 2016-04-23 NOTE — ED Triage Notes (Signed)
Pt brought into the ED via [facility] from [facility] with c/o urinary sx, states she has a UTI, states she became disoriented Friday and realized she was in the woods . Pt is a/ox3 in triage.. [facility] referred pt to the ED for IV treatment of the UTI.Marland Kitchen

## 2016-04-23 NOTE — Discharge Instructions (Signed)
You have been seen in the Emergency Department (ED) today for pain when urinating.  Symptoms today suggest that you have a urinary tract infection (UTI).  Call your regular doctor to schedule the next available appointment to follow up on today?s ED visit, or return immediately to the ED if your pain worsens, you have confusion again, you have decreased urine production, develop fever, persistent vomiting, or other symptoms that concern you.

## 2016-04-23 NOTE — ED Provider Notes (Signed)
[facility] Emergency Department Provider Note   ____________________________________________   First MD Initiated Contact with Patient 04/23/16 1619     (approximate)  I have reviewed the triage vital signs and the nursing notes.   HISTORY  Chief Complaint Altered Mental Status    HPI Margaret Fischer is a 75 y.o. female previous history of multiple UTIs, occasionally developing confusion with them to the patient and her son. Also previous traumatic head injury due to a fall, possibly thought secondary UTI per the family.  Insulin-dependent diabetic. 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  Both the patient and her son report that on Saturday she called her son to tell him that she thought she had a bad dream and had taken herself out of the house and was standing next to his boat in the yard. He came and evaluated her, and he reports that he has not noticed any confusion himself but the episode was concerning as she's had similar episodes where she will get confused when she's had infection from urinary tract problems. The patient herself reports that she's had a burning sensation and an abnormal urine odor for the last 1 week. Not on any antibiotics.  There went to urgent care who felt she had a urinary tract infection and thought she needed to be started on IV antibiotic. She denies being in any pain. No nausea or vomiting. She has a mild headache around the right side of the face which she reports is been ongoing January and unchanged.  Son reports that she has been well oriented today, no confusion or recurrence of any confusion. No fevers. No nausea or vomiting. No abdominal pain. No chest pain or shortness of breath   Past Medical History:  Diagnosis Date  . Anxiety   . Basal cell carcinoma of nose   . Bradycardia   . Chest pain, non-cardiac    History of 2 normal cardiac catheterizations  . Chronic back pain   . Chronic chest pain   . Chronic  neck pain   . Depression   . Depression   . Diabetes mellitus    Insulin dependent  . Dizziness    Chronic  . Fibromyalgia   . Goiter   . Hyperlipidemia   . Hypertension   . Migraine   . Neuropathy ([place])   . 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 Thyroid cyst     Patient Active Problem List   Diagnosis Date Noted  . Type 2 diabetes mellitus with hyperglycemia ([place]) 09/12/2015  . Dysphasia 09/12/2015  . Lactic acid acidosis   . Dysarthria 09/11/2015  . Elevated lactic acid level 09/11/2015  . Altered mental status 09/11/2015  . Thrombocytosis ([place])   . 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 Drug abuse, marijuana   . 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 Left hip pain   . Chronic diastolic CHF (congestive heart failure) ([place])   . Depression   . Anxiety state   . HLD (hyperlipidemia)   . Subdural hematoma ([place]) 08/28/2015  . Diabetes mellitus type 2, controlled ([place]) 08/31/2013  . Fibromyalgia 08/31/2013  . Acute encephalopathy 08/31/2013  . Chest pain 01/08/2011  . HTN (hypertension) 01/08/2011  . Depressed 01/08/2011    Past Surgical History:  Procedure Laterality Date  . ABDOMINAL HYSTERECTOMY    . APPENDECTOMY    . BICEPS TENDON REPAIR    . BREAST LUMPECTOMY    .  CARDIAC CATHETERIZATION     History of 2 caths, reportedly normal  . CHOLECYSTECTOMY    . CYSTECTOMY    . JOINT REPLACEMENT    . LASIK    . REFRACTIVE SURGERY    . right nares  09/2011   basal cell surgery  . ROTATOR CUFF REPAIR    . ROTATOR CUFF REPAIR    . TOTAL KNEE ARTHROPLASTY    . VESICOVAGINAL FISTULA CLOSURE W/ TAH      Prior to Admission medications   Medication Sig Start Date End Date Taking? Authorizing Provider  amLODipine (NORVASC) 10 MG tablet Take 1 tablet (10 mg total) by mouth daily. 12/08/13   Olam Idler, MD  benzonatate (TESSALON) 100 MG capsule Take 1 capsule (100 mg total) by mouth 3 (three) times daily as needed for cough.  09/07/15   Ripudeep Krystal Eaton, MD  carvedilol (COREG) 6.25 MG tablet Take 1 tablet (6.25 mg total) by mouth 2 (two) times daily with a meal. 09/07/15   Ripudeep K Rai, MD  cholecalciferol (VITAMIN D) 1000 units tablet Take 1,000 Units by mouth daily.    Historical Provider, MD  clonazePAM (KLONOPIN) 0.5 MG tablet Take 1 tablet (0.5 mg total) by mouth 2 (two) times daily. Patient taking differently: Take 0.5 mg by mouth at bedtime.  09/07/15   Ripudeep Krystal Eaton, MD  cyanocobalamin 500 MCG tablet Take 500 mcg by mouth daily.    Historical Provider, MD  diclofenac (VOLTAREN) 75 MG EC tablet Take 75 mg by mouth 2 (two) times daily.    Historical Provider, MD  FLUoxetine (PROZAC) 40 MG capsule Take 1 capsule (40 mg total) by mouth daily. 09/07/15   Ripudeep Krystal Eaton, MD  fosfomycin (MONUROL) 3 g PACK Take 3 g by mouth once. Please mix in 8 oz of water, take by mouth once on 04/26/2016 04/23/16 04/23/16  Delman Kitten, MD  glucosamine-chondroitin 500-400 MG tablet Take 1 tablet by mouth 2 (two) times daily.      Historical Provider, MD  insulin aspart (NOVOLOG) 100 UNIT/ML injection Sliding scale  CBG 70 - 120: 0 units: CBG 121 - 150: 2 units; CBG 151 - 200: 3 units; CBG 201 - 250: 5 units; CBG 251 - 300: 8 units;CBG 301 - 350: 11 units; CBG 351 - 400: 15 units; CBG > 400 : 15 units and notify MD 09/07/15   Ripudeep Krystal Eaton, MD  insulin detemir (LEVEMIR) 100 UNIT/ML injection Inject 0.2 mLs (20 Units total) into the skin 2 (two) times daily. 09/13/15   Orson Eva, MD  lisinopril (PRINIVIL,ZESTRIL) 20 MG tablet Take 1 tablet (20 mg total) by mouth daily. 12/08/13   Olam Idler, MD  metFORMIN (GLUCOPHAGE) 1000 MG tablet Take 1,000 mg by mouth 2 (two) times daily with a meal.    Historical Provider, MD  nitroGLYCERIN (NITROSTAT) 0.4 MG SL tablet Place 0.4 mg under the tongue every 5 (five) minutes as needed for chest pain.    Historical Provider, MD  nystatin (MYCOSTATIN) 100000 UNIT/ML suspension Use as directed 5 mLs (500,000 Units  total) in the mouth or throat 4 (four) times daily. 09/07/15   Ripudeep Krystal Eaton, MD  oxyCODONE-acetaminophen (PERCOCET/ROXICET) 5-325 MG tablet Take 1 tablet by mouth every 6 (six) hours as needed for moderate pain. 09/13/15   Orson Eva, MD  pantoprazole (PROTONIX) 40 MG tablet Take 1 tablet (40 mg total) by mouth daily. 09/07/15   Ripudeep Krystal Eaton, MD  pravastatin (PRAVACHOL) 40 MG tablet Take  1 tablet (40 mg total) by mouth daily. Patient taking differently: Take 40 mg by mouth every evening.  09/01/13   Bobby Rumpf [place], PA-C    Allergies Epinephrine; Hydromorphone; Latex; Morphine and related; Phenergan [promethazine hcl]; and Succinylcholine  Family History  Problem Relation Age of Onset  . Lymphoma Mother   . Diabetic kidney disease Mother   . Diabetic kidney disease Brother   . Heart disease Sister   . Cancer Sister   . Heart attack Brother   . Myasthenia gravis Brother     Social History Social History  Substance Use Topics  . Smoking status: Former Smoker    Quit date: 12/25/1983  . Smokeless tobacco: Never Used  . Alcohol use No    Review of Systems Constitutional: No fever/chills Eyes: No visual changes. ENT: No sore throat. Cardiovascular: Denies chest pain. Respiratory: Denies shortness of breath. Gastrointestinal: No abdominal pain.  No nausea, no vomiting.  No diarrhea.  No constipation. Genitourinary:See history of present illness Musculoskeletal: Negative for back pain. Skin: Negative for rash. Neurological: Negative for headaches, focal weakness or numbness.  10-point ROS otherwise negative.  Patient reports that on Saturday when she wandered into the yard she did check her blood sugar after she realized where she was no was 153.  ____________________________________________   PHYSICAL EXAM:  VITAL SIGNS: ED Triage Vitals  Enc Vitals Group     BP 04/23/16 1606 (!) 166/112     Pulse Rate 04/23/16 1606 (!) 53     Resp 04/23/16 1606 18     Temp 04/23/16  1606 98.3 F (36.8 C)     Temp Source 04/23/16 1606 Oral     SpO2 04/23/16 1606 97 %     Weight 04/23/16 1607 167 lb (75.8 kg)     Height 04/23/16 1607 5\' 8"  (1.727 m)     Head Circumference --      Peak Flow --      Pain Score 04/23/16 1607 3     Pain Loc --      Pain Edu? --      Excl. in [place]? --     Constitutional: Alert and oriented. Well appearing and in no acute distress.Tells me this 04/23/2016 Labor Day. No psychomotor agitation. Eyes: Conjunctivae are normal. PERRL. EOMI. Head: Atraumatic. Nose: No congestion/rhinnorhea. Mouth/Throat: Mucous membranes are moist.  Oropharynx non-erythematous. Neck: No stridor.   Cardiovascular: Normal rate, regular rhythm. Grossly normal heart sounds.  Good peripheral circulation. Respiratory: Normal respiratory effort.  No retractions. Lungs CTAB. Gastrointestinal: Soft and nontender. No distention.  Musculoskeletal: No lower extremity tenderness nor edema.  No joint effusions. Walks with a normal gait. Neurologic:  Normal speech and language. No gross focal neurologic deficits are appreciated. No gait instability. No cranial nerve deficits. No pronator drift. Skin:  Skin is warm, dry and intact. No rash noted. Psychiatric: Mood and affect are normal. Speech and behavior are normal.  ____________________________________________   LABS (all labs ordered are listed, but only abnormal results are displayed)  Labs Reviewed  COMPREHENSIVE METABOLIC PANEL - Abnormal; Notable for the following:       Result Value   Glucose, Bld 111 (*)    BUN 21 (*)    All other components within normal limits  URINALYSIS COMPLETEWITH MICROSCOPIC ([facility] ONLY) - Abnormal; Notable for the following:    Color, Urine RED (*)    APPearance CLEAR (*)    Glucose, UA   (*)    Value: TEST NOT REPORTED  DUE TO COLOR INTERFERENCE OF URINE PIGMENT   Bilirubin Urine   (*)    Value: TEST NOT REPORTED DUE TO COLOR INTERFERENCE OF URINE PIGMENT   Ketones, ur   (*)     Value: TEST NOT REPORTED DUE TO COLOR INTERFERENCE OF URINE PIGMENT   Hgb urine dipstick   (*)    Value: TEST NOT REPORTED DUE TO COLOR INTERFERENCE OF URINE PIGMENT   Protein, ur   (*)    Value: TEST NOT REPORTED DUE TO COLOR INTERFERENCE OF URINE PIGMENT   Nitrite   (*)    Value: TEST NOT REPORTED DUE TO COLOR INTERFERENCE OF URINE PIGMENT   Leukocytes, UA   (*)    Value: TEST NOT REPORTED DUE TO COLOR INTERFERENCE OF URINE PIGMENT   Squamous Epithelial / LPF 0-5 (*)    All other components within normal limits  URINE CULTURE  CBC  CBG MONITORING, ED   ____________________________________________  EKG   ____________________________________________  RADIOLOGY  Patient denies any new head injuries, she denies any new symptoms other than having chronic headache which is unchanged. No obvious evidence support need for immediate neuroimaging at this time. ____________________________________________   PROCEDURES  Procedure(s) performed: None  Procedures  Critical Care performed: No  ____________________________________________   INITIAL IMPRESSION / ASSESSMENT AND PLAN / ED COURSE  Pertinent labs & imaging results that were available during my care of the patient were reviewed by me and considered in my medical decision making (see chart for details).  No neurologic cardiac or pulmonary symptoms. Confusion evidently was brief and resolved Saturday, questionably sleepwalking-type episode. She does however report dysuria developing over the last week, and has had confusion from urinary tract infections in the past many times per her son. She is alert and fully oriented, in no distress with no significant complaint other dysuria and abnormal urine odor at this time. Felt to be very stable  Clinical Course     ____________________________________________   FINAL CLINICAL IMPRESSION(S) / ED DIAGNOSES  Final diagnoses:  Dysuria  UTI (lower urinary tract infection)    ----------------------------------------- 5:44 PM on 04/23/2016 -----------------------------------------  Significant color interference. Discussed with patient and she is currently taking an over-the-counter medicine for dysuria that cause her urine to be an orange red color. Though no bacteria are seen, she clearly reports dysuria symptoms and possibly some confusion on Saturday versus sleepwalking. Given her symptomatology and previous history of frequent UTIs, we will send a culture and treat with fosfomycin here and re-dosing on Thursday. Patient's family will monitor her for symptoms closely, she develops any new concerns like fever, confusion, weakness vomiting or other concerns and will bring her back to the emergency room.  Return precautions and treatment recommendations and follow-up discussed with the patient who is agreeable with the plan.    NEW MEDICATIONS STARTED DURING THIS VISIT:  New Prescriptions   FOSFOMYCIN (MONUROL) 3 G PACK    Take 3 g by mouth once. Please mix in 8 oz of water, take by mouth once on 04/26/2016     Note:  This document was prepared using Dragon voice recognition software and may include unintentional dictation errors.     Delman Kitten, MD 04/23/16 [phone]

## 2016-04-25 LAB — URINE CULTURE: Culture: NO GROWTH

## 2016-09-25 ENCOUNTER — Telehealth: Payer: Self-pay | Admitting: Nurse Practitioner

## 2016-09-25 NOTE — Telephone Encounter (Signed)
Pt sister called and wanted to know If you would take her on as a new pt   Margaret Fischer sister Best number [phone]

## 2016-11-27 NOTE — Telephone Encounter (Signed)
OK. Thx

## 2016-12-07 ENCOUNTER — Telehealth: Payer: Self-pay | Admitting: *Deleted

## 2016-12-07 NOTE — Telephone Encounter (Signed)
Pt states she has an appt on Monday and would like to know if she needed to take her polish off. I told her she did.

## 2016-12-10 ENCOUNTER — Ambulatory Visit (INDEPENDENT_AMBULATORY_CARE_PROVIDER_SITE_OTHER): Payer: Medicare Other | Admitting: Podiatry

## 2016-12-10 DIAGNOSIS — Q828 Other specified congenital malformations of skin: Secondary | ICD-10-CM | POA: Diagnosis not present

## 2016-12-10 NOTE — Progress Notes (Signed)
Subjective:     Patient ID: Margaret Fischer, female   DOB: August 14, 1941, 76 y.o.   MRN: [id number]  HPI this patient presents the office with chief complaint of 2 painful calluses on her left foot. She says the callus on her forefoot is painful walking and wearing her shoes. He says the callus on her left heel is painful walking, but she also feels a movable object in her left heel. She says this is occasionally painful when she walks. She presents the office today for an evaluation and treatment of both calluses. Patient is diabetic with neuropathy   Review of Systems     Objective:   Physical Exam GENERAL APPEARANCE: Alert, conversant. Appropriately groomed. No acute distress.  VASCULAR: Pedal pulses are  palpable at  [facility] and PT bilateral.  Capillary refill time is immediate to all digits,  Normal temperature gradient.  Digital hair growth is present bilateral  NEUROLOGIC: sensation is absent  to 5.07 monofilament at 5/5 sites bilateral.  Light touch is intact bilateral, Muscle strength normal.  MUSCULOSKELETAL: acceptable muscle strength, tone and stability bilateral.  Intrinsic muscluature intact bilateral.  Rectus appearance of foot and digits noted bilateral. DJD dorsal aspect midfoot  B/L.  Moveable mass left heel noted.  DERMATOLOGIC: skin color, texture, and turgor are within normal limits.  No preulcerative lesions or ulcers  are seen, no interdigital maceration noted.  No open lesions present.  Digital nails are asymptomatic. No drainage noted..  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    Plan:    IE  Debride callus.   discussed the calluses on her left foot with this patient. No evidence of any foreign body noted in the left heel. I believe this develops due to this fibroma that was noted in her left heel.  Porokeratosis is directly under the tibial sesamoid left foot.  Return to the clinic when necessary   Gardiner Barefoot DPM

## 2017-09-13 ENCOUNTER — Emergency Department
Admission: EM | Admit: 2017-09-13 | Discharge: 2017-09-13 | Disposition: A | Discharge status: Home / Self Care | Payer: Medicare Other | Attending: Emergency Medicine | Admitting: Emergency Medicine

## 2017-09-13 ENCOUNTER — Encounter: Payer: Self-pay | Admitting: Emergency Medicine

## 2017-09-13 ENCOUNTER — Other Ambulatory Visit: Payer: Self-pay

## 2017-09-13 ENCOUNTER — Emergency Department: Payer: Medicare Other

## 2017-09-13 DIAGNOSIS — I5032 Chronic diastolic (congestive) heart failure: Secondary | ICD-10-CM | POA: Insufficient documentation

## 2017-09-13 DIAGNOSIS — S22000A Wedge compression fracture of unspecified thoracic vertebra, initial encounter for closed fracture: Secondary | ICD-10-CM

## 2017-09-13 DIAGNOSIS — Z91040 Latex allergy status: Secondary | ICD-10-CM | POA: Insufficient documentation

## 2017-09-13 DIAGNOSIS — I110 Hypertensive heart disease with heart failure: Secondary | ICD-10-CM | POA: Insufficient documentation

## 2017-09-13 DIAGNOSIS — M4854XA Collapsed vertebra, not elsewhere classified, thoracic region, initial encounter for fracture: Secondary | ICD-10-CM | POA: Diagnosis not present

## 2017-09-13 DIAGNOSIS — Z7982 Long term (current) use of aspirin: Secondary | ICD-10-CM | POA: Insufficient documentation

## 2017-09-13 DIAGNOSIS — E119 Type 2 diabetes mellitus without complications: Secondary | ICD-10-CM | POA: Diagnosis not present

## 2017-09-13 DIAGNOSIS — Z79899 Other long term (current) drug therapy: Secondary | ICD-10-CM | POA: Insufficient documentation

## 2017-09-13 DIAGNOSIS — Z87891 Personal history of nicotine dependence: Secondary | ICD-10-CM | POA: Insufficient documentation

## 2017-09-13 DIAGNOSIS — Z794 Long term (current) use of insulin: Secondary | ICD-10-CM | POA: Diagnosis not present

## 2017-09-13 DIAGNOSIS — I10 Essential (primary) hypertension: Secondary | ICD-10-CM | POA: Insufficient documentation

## 2017-09-13 DIAGNOSIS — R1032 Left lower quadrant pain: Secondary | ICD-10-CM | POA: Diagnosis present

## 2017-09-13 HISTORY — DX: Unspecified injury of unspecified eye and orbit, initial encounter: S05.90XA

## 2017-09-13 LAB — COMPREHENSIVE METABOLIC PANEL
ALT: 13 U/L — ABNORMAL LOW (ref 14–54)
AST: 25 U/L (ref 15–41)
Albumin: 4.2 g/dL (ref 3.5–5.0)
Alkaline Phosphatase: 107 U/L (ref 38–126)
Anion gap: 13 (ref 5–15)
BILIRUBIN TOTAL: 1.7 mg/dL — AB (ref 0.3–1.2)
BUN: 18 mg/dL (ref 6–20)
CHLORIDE: 100 mmol/L — AB (ref 101–111)
CO2: 22 mmol/L (ref 22–32)
Calcium: 9.3 mg/dL (ref 8.9–10.3)
Creatinine, Ser: 0.52 mg/dL (ref 0.44–1.00)
Glucose, Bld: 317 mg/dL — ABNORMAL HIGH (ref 65–99)
POTASSIUM: 4.2 mmol/L (ref 3.5–5.1)
Sodium: 135 mmol/L (ref 135–145)
TOTAL PROTEIN: 7.4 g/dL (ref 6.5–8.1)

## 2017-09-13 LAB — CBC
HEMATOCRIT: 53.4 % — AB (ref 35.0–47.0)
Hemoglobin: 17.6 g/dL — ABNORMAL HIGH (ref 12.0–16.0)
MCH: 31 pg (ref 26.0–34.0)
MCHC: 33 g/dL (ref 32.0–36.0)
MCV: 94.1 fL (ref 80.0–100.0)
PLATELETS: 350 10*3/uL (ref 150–440)
RBC: 5.68 MIL/uL — AB (ref 3.80–5.20)
RDW: 13.3 % (ref 11.5–14.5)
WBC: 13.1 10*3/uL — AB (ref 3.6–11.0)

## 2017-09-13 LAB — URINALYSIS, COMPLETE (UACMP) WITH MICROSCOPIC
BACTERIA UA: NONE SEEN
BILIRUBIN URINE: NEGATIVE
Glucose, UA: >=500 mg/dL — AB
KETONES UR: 80 mg/dL — AB
Leukocytes, UA: NEGATIVE
NITRITE: NEGATIVE
PH: 5 (ref 5.0–8.0)
Protein, ur: 30 mg/dL — AB
SPECIFIC GRAVITY, URINE: 1.03 (ref 1.005–1.030)
Squamous Epithelial / LPF: NONE SEEN

## 2017-09-13 LAB — LIPASE, BLOOD: LIPASE: 20 U/L (ref 11–51)

## 2017-09-13 MED ORDER — IOPAMIDOL (ISOVUE-300) INJECTION 61%
100.0000 mL | Freq: Once | INTRAVENOUS | Status: AC | PRN
  Administered 2017-09-13: 100 mL via INTRAVENOUS

## 2017-09-13 MED ORDER — SODIUM CHLORIDE 0.9 % IV SOLN
1000.0000 mL | Freq: Once | INTRAVENOUS | Status: AC
  Administered 2017-09-13: 1000 mL via INTRAVENOUS

## 2017-09-13 MED ORDER — ONDANSETRON HCL 4 MG/2ML IJ SOLN
4.0000 mg | Freq: Once | INTRAMUSCULAR | Status: AC
  Administered 2017-09-13: 4 mg via INTRAVENOUS

## 2017-09-13 MED ORDER — TRAMADOL HCL 50 MG PO TABS: 50.0000 mg | ORAL_TABLET | Freq: Four times a day (QID) | ORAL | 0 refills | Status: AC | PRN

## 2017-09-13 MED ORDER — ONDANSETRON HCL 4 MG/2ML IJ SOLN
INTRAMUSCULAR | Status: AC
  Administered 2017-09-13: 4 mg via INTRAVENOUS
  Filled 2017-09-13: qty 2

## 2017-09-13 MED ORDER — IOPAMIDOL (ISOVUE-300) INJECTION 61%: 30.0000 mL | Freq: Once | INTRAVENOUS | Status: DC

## 2017-09-13 NOTE — ED Notes (Addendum)
Pt experiencing nausea and emesis with movement. Orders received. Awaiting CT scan. Family at bedside.

## 2017-09-13 NOTE — ED Notes (Signed)
Pt back from CT.  Urinated, back in bed at this time.

## 2017-09-13 NOTE — ED Notes (Signed)
Patient transported to CT 

## 2017-09-13 NOTE — ED Triage Notes (Signed)
Says pain in back and abd since last week.  Started on right side, but now it is worse on left flank area and left side abd.  She is also vomiting currently.

## 2017-09-13 NOTE — ED Notes (Signed)
Pt alert and oriented X4, active, cooperative, pt in NAD. RR even and unlabored, color WNL.  Pt informed to return if any life threatening symptoms occur.  Discharge and followup instructions reviewed.  

## 2017-09-13 NOTE — ED Provider Notes (Signed)
[facility] Emergency Department Provider Note   ____________________________________________    I have reviewed the triage vital signs and the nursing notes.   HISTORY  Chief Complaint Abdominal Pain and Emesis     HPI Margaret Fischer is a 77 y.o. female who presents with complaints of left lower quadrant abdominal pain and nausea and vomiting.  Apparently symptoms started nearly a week ago with pain primarily in her bilateral lower back over the last several days it is primarily become left lower back pain which has now traveled around to her left lower quadrant.  She is tender in this area.  She has not had a bowel movement in 2-3 days.  She developed nausea and vomiting today.  She has had a cholecystectomy, appendectomy, hysterectomy.  Denies fevers.  Has not taken anything for this.  Has never had this before   Past Medical History:  Diagnosis Date  . Anxiety   . Basal cell carcinoma of nose   . Bradycardia   . Chest pain, non-cardiac    History of 2 normal cardiac catheterizations  . Chronic back pain   . Chronic chest pain   . Chronic neck pain   . Depression   . Depression   . Diabetes mellitus    Insulin dependent  . Dizziness    Chronic  . Eye trauma   . Fibromyalgia   . Goiter   . Hyperlipidemia   . Hypertension   . Migraine   . Neuropathy   . 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 Thyroid cyst     Patient Active Problem List   Diagnosis Date Noted  . Type 2 diabetes mellitus with hyperglycemia ([place]) 09/12/2015  . Dysphasia 09/12/2015  . Lactic acid acidosis   . Dysarthria 09/11/2015  . Elevated lactic acid level 09/11/2015  . Altered mental status 09/11/2015  . Thrombocytosis ([place])   . 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 Drug abuse, marijuana   . 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 Left hip pain   . Chronic diastolic CHF (congestive heart failure) ([place])   . Depression     . Anxiety state   . HLD (hyperlipidemia)   . Subdural hematoma ([place]) 08/28/2015  . Diabetes mellitus type 2, controlled ([place]) 08/31/2013  . Fibromyalgia 08/31/2013  . Acute encephalopathy 08/31/2013  . Chest pain 01/08/2011  . HTN (hypertension) 01/08/2011  . Depressed 01/08/2011    Past Surgical History:  Procedure Laterality Date  . ABDOMINAL HYSTERECTOMY    . APPENDECTOMY    . BICEPS TENDON REPAIR    . BREAST LUMPECTOMY    . CARDIAC CATHETERIZATION     History of 2 caths, reportedly normal  . CHOLECYSTECTOMY    . CYSTECTOMY    . JOINT REPLACEMENT    . LASIK    . REFRACTIVE SURGERY    . right nares  09/2011   basal cell surgery  . ROTATOR CUFF REPAIR    . ROTATOR CUFF REPAIR    . TOTAL KNEE ARTHROPLASTY    . VESICOVAGINAL FISTULA CLOSURE W/ TAH      Prior to Admission medications   Medication Sig Start Date End Date Taking? Authorizing Provider  amLODipine (NORVASC) 10 MG tablet Take 1 tablet (10 mg total) by mouth daily. 12/08/13  Yes Olam Idler, MD  aspirin 325 MG tablet Take 325 mg by mouth daily.   Yes  [provider]  carvedilol (COREG) 6.25 MG tablet Take 1 tablet (6.25 mg total) by mouth 2 (two) times daily with a meal. 09/07/15  Yes Rai, Ripudeep K, MD  citalopram (CELEXA) 40 MG tablet Take 40 mg by mouth daily.   Yes [provider]  clonazePAM (KLONOPIN) 0.5 MG tablet Take 1 tablet (0.5 mg total) by mouth 2 (two) times daily. Patient taking differently: Take 1 mg by mouth 3 (three) times daily as needed.  09/07/15  Yes Rai, Ripudeep K, MD  insulin aspart (NOVOLOG) 100 UNIT/ML injection Sliding scale  CBG 70 - 120: 0 units: CBG 121 - 150: 2 units; CBG 151 - 200: 3 units; CBG 201 - 250: 5 units; CBG 251 - 300: 8 units;CBG 301 - 350: 11 units; CBG 351 - 400: 15 units; CBG > 400 : 15 units and notify MD 09/07/15  Yes Rai, Ripudeep K, MD  insulin glargine (LANTUS) 100 UNIT/ML injection Inject 40 Units into the skin at bedtime.   Yes [provider]  metFORMIN (GLUCOPHAGE) 1000 MG tablet Take 1,000 mg by mouth 2 (two) times daily with a meal.   Yes [provider]  rosuvastatin (CRESTOR) 20 MG tablet Take 20 mg by mouth daily.   Yes [provider]  benzonatate (TESSALON) 100 MG capsule Take 1 capsule (100 mg total) by mouth 3 (three) times daily as needed for cough. Patient not taking: Reported on 09/13/2017 09/07/15   Rai, Vernelle Emerald, MD  cholecalciferol (VITAMIN D) 1000 units tablet Take 1,000 Units by mouth daily.    [provider]  cyanocobalamin 500 MCG tablet Take 500 mcg by mouth daily.    [provider]  diclofenac (VOLTAREN) 75 MG EC tablet Take 75 mg by mouth 2 (two) times daily.    [provider]  FLUoxetine (PROZAC) 40 MG capsule Take 1 capsule (40 mg total) by mouth daily. Patient not taking: Reported on 09/13/2017 09/07/15   Rai, Vernelle Emerald, MD  glucosamine-chondroitin 500-400 MG tablet Take 1 tablet by mouth 2 (two) times daily.      [provider]  insulin detemir (LEVEMIR) 100 UNIT/ML injection Inject 0.2 mLs (20 Units total) into the skin 2 (two) times daily. Patient not taking: Reported on 09/13/2017 09/13/15   Orson Eva, MD  lisinopril (PRINIVIL,ZESTRIL) 20 MG tablet Take 1 tablet (20 mg total) by mouth daily. Patient not taking: Reported on 09/13/2017 12/08/13   Olam Idler, MD  nitroGLYCERIN (NITROSTAT) 0.4 MG SL tablet Place 0.4 mg under the tongue every 5 (five) minutes as needed for chest pain.    [provider]  nystatin (MYCOSTATIN) 100000 UNIT/ML suspension Use as directed 5 mLs (500,000 Units total) in the mouth or throat 4 (four) times daily. Patient not taking: Reported on 09/13/2017 09/07/15   Rai, Vernelle Emerald, MD  pantoprazole (PROTONIX) 40 MG tablet Take 1 tablet (40 mg total) by mouth daily. Patient not taking: Reported on 09/13/2017 09/07/15   Rai, Vernelle Emerald, MD  pravastatin (PRAVACHOL) 40 MG tablet Take 1 tablet (40 mg total) by  mouth daily. Patient not taking: Reported on 09/13/2017 09/01/13   Dellinger, Bobby Rumpf, PA-C  traMADol (ULTRAM) 50 MG tablet Take 1 tablet (50 mg total) by mouth every 6 (six) hours as needed. 09/13/17 09/13/18  Lavonia Drafts, MD     Allergies Epinephrine; Codeine; Dilaudid [hydromorphone hcl]; Haldol [haloperidol]; Oxycontin [oxycodone hcl]; Hydromorphone; Latex; Morphine and related; Phenergan [promethazine hcl]; and Succinylcholine  Family History  Problem  Relation Age of Onset  . Lymphoma Mother   . Diabetic kidney disease Mother   . Diabetic kidney disease Brother   . Heart disease Sister   . Cancer Sister   . Heart attack Brother   . Myasthenia gravis Brother     Social History Social History   Tobacco Use  . Smoking status: Former Smoker    Last attempt to quit: 12/25/1983    Years since quitting: 33.7  . Smokeless tobacco: Never Used  Substance Use Topics  . Alcohol use: No  . Drug use: No    Review of Systems  Constitutional: No fever/chills Eyes: No visual changes.  ENT: No sore throat. Cardiovascular: Denies chest pain. Respiratory: Denies shortness of breath. Gastrointestinal: Abdominal pain as above.   Genitourinary: Negative for dysuria.  Does have a history of frequent urinary tract infections Musculoskeletal: Negative for back pain. Skin: Negative for rash. Neurological: Negative for headaches   ____________________________________________   PHYSICAL EXAM:  VITAL SIGNS: ED Triage Vitals  Enc Vitals Group     BP 09/13/17 1109 (!) 168/69     Pulse Rate 09/13/17 1109 96     Resp 09/13/17 1109 20     Temp 09/13/17 1109 97.8 F (36.6 C)     Temp Source 09/13/17 1109 Oral     SpO2 09/13/17 1109 97 %     Weight 09/13/17 1112 88.9 kg (196 lb)     Height 09/13/17 1112 1.727 m (5\' 8" )     Head Circumference --      Peak Flow --      Pain Score 09/13/17 1112 9     Pain Loc --      Pain Edu? --      Excl. in [place]? --     Constitutional: Alert and  oriented. No acute distress. Pleasant and interactive Eyes: Conjunctivae are normal.   Nose: No congestion/rhinnorhea. Mouth/Throat: Mucous membranes are moist.    Cardiovascular: Normal rate, regular rhythm. Grossly normal heart sounds.  Good peripheral circulation. Respiratory: Normal respiratory effort.  No retractions. Gastrointestinal: Moderate tenderness palpation left lower quadrant. no distention.  Mild left CVA tenderness Genitourinary: deferred Musculoskeletal:   Warm and well perfused Neurologic:  Normal speech and language. No gross focal neurologic deficits are appreciated.  Skin:  Skin is warm, dry and intact. No rash noted. Psychiatric: Mood and affect are normal. Speech and behavior are normal.  ____________________________________________   LABS (all labs ordered are listed, but only abnormal results are displayed)  Labs Reviewed  COMPREHENSIVE METABOLIC PANEL - Abnormal; Notable for the following components:      Result Value   Chloride 100 (*)    Glucose, Bld 317 (*)    ALT 13 (*)    Total Bilirubin 1.7 (*)    All other components within normal limits  CBC - Abnormal; Notable for the following components:   WBC 13.1 (*)    RBC 5.68 (*)    Hemoglobin 17.6 (*)    HCT 53.4 (*)    All other components within normal limits  URINALYSIS, COMPLETE (UACMP) WITH MICROSCOPIC - Abnormal; Notable for the following components:   Color, Urine YELLOW (*)    APPearance CLEAR (*)    Glucose, UA >=500 (*)    Hgb urine dipstick SMALL (*)    Ketones, ur 80 (*)    Protein, ur 30 (*)    All other components within normal limits  LIPASE, BLOOD   ____________________________________________  EKG  None ____________________________________________  RADIOLOGY  CT abdomen pelvis ____________________________________________   PROCEDURES  Procedure(s) performed: No  Procedures   Critical Care performed: No ____________________________________________   INITIAL  IMPRESSION / ASSESSMENT AND PLAN / ED COURSE  Pertinent labs & imaging results that were available during my care of the patient were reviewed by me and considered in my medical decision making (see chart for details).  Patient presents with nausea vomiting primarily left lower quadrant abdominal pain with radiation to the left back as well.  Differential diagnosis includes diverticulitis, urinary tract infection, pyelonephritis, kidney stone.  Will give IV fluids, IV nausea medication, patient has refused IV medications because they make her loopy.  Obtain CT abdomen pelvis evaluate for diverticulitis.  ----------------------------------------- 3:08 PM on 09/13/2017 -----------------------------------------  CT abdomen and pelvis demonstrates T12 acute compression fracture, given the patient's description of initial back pain I suspect this is the cause of her pain.  No evidence of cord injury.  Neuro intact.  Will provide analgesics follow-up with Dr. Rudene Christians for evaluation of possible kyphoplasty.  Answered all questions for patient and family    ____________________________________________   FINAL CLINICAL IMPRESSION(S) / ED DIAGNOSES  Final diagnoses:  Compression fracture of body of thoracic vertebra [facility])        Note:  This document was prepared using Dragon voice recognition software and may include unintentional dictation errors.    Lavonia Drafts, MD 09/13/17 [phone]

## 2017-09-13 NOTE — ED Triage Notes (Signed)
First Nurse Note:  Arrives with C/O low back pain, N/V x 1 week.  Skin warm and dry.

## 2017-09-13 NOTE — ED Notes (Signed)
Pt up to urinate. 

## 2017-09-13 NOTE — ED Notes (Signed)
Pt up to bathroom.

## 2017-09-13 NOTE — ED Notes (Signed)
Pt c/o left flank pain x 3 days, left lower quadrant pain X 3 days. Last BM X 3 days ago. Nausea/vomiting when pain increases. Urine odor per patient. Pt alert and oriented X4, active, cooperative, pt in NAD. RR even and unlabored, color WNL.

## 2017-09-17 ENCOUNTER — Other Ambulatory Visit: Payer: Self-pay | Admitting: Orthopedic Surgery

## 2017-09-17 DIAGNOSIS — S22080A Wedge compression fracture of T11-T12 vertebra, initial encounter for closed fracture: Secondary | ICD-10-CM

## 2017-09-18 ENCOUNTER — Ambulatory Visit
Admission: RE | Admit: 2017-09-18 | Discharge: 2017-09-18 | Disposition: A | Discharge status: Home / Self Care | Payer: Medicare Other | Source: Ambulatory Visit | Attending: Orthopedic Surgery | Admitting: Orthopedic Surgery

## 2017-09-18 DIAGNOSIS — M47896 Other spondylosis, lumbar region: Secondary | ICD-10-CM | POA: Insufficient documentation

## 2017-09-18 DIAGNOSIS — M4686 Other specified inflammatory spondylopathies, lumbar region: Secondary | ICD-10-CM | POA: Diagnosis not present

## 2017-09-18 DIAGNOSIS — S22080A Wedge compression fracture of T11-T12 vertebra, initial encounter for closed fracture: Secondary | ICD-10-CM | POA: Insufficient documentation

## 2017-09-18 DIAGNOSIS — M4316 Spondylolisthesis, lumbar region: Secondary | ICD-10-CM | POA: Insufficient documentation

## 2017-09-18 DIAGNOSIS — X58XXXA Exposure to other specified factors, initial encounter: Secondary | ICD-10-CM | POA: Insufficient documentation

## 2017-09-19 ENCOUNTER — Ambulatory Visit: Payer: Medicare Other | Admitting: Anesthesiology

## 2017-09-19 ENCOUNTER — Encounter: Payer: Self-pay | Admitting: *Deleted

## 2017-09-19 ENCOUNTER — Ambulatory Visit: Payer: Medicare Other

## 2017-09-19 ENCOUNTER — Ambulatory Visit
Admission: RE | Admit: 2017-09-19 | Discharge: 2017-09-19 | Disposition: A | Discharge status: Home / Self Care | Payer: Medicare Other | Source: Ambulatory Visit | Attending: Orthopedic Surgery | Admitting: Orthopedic Surgery

## 2017-09-19 ENCOUNTER — Encounter
Admission: RE | Disposition: A | Discharge status: Home / Self Care | Payer: Self-pay | Source: Ambulatory Visit | Attending: Orthopedic Surgery

## 2017-09-19 DIAGNOSIS — Z7982 Long term (current) use of aspirin: Secondary | ICD-10-CM | POA: Insufficient documentation

## 2017-09-19 DIAGNOSIS — Z79899 Other long term (current) drug therapy: Secondary | ICD-10-CM | POA: Insufficient documentation

## 2017-09-19 DIAGNOSIS — Z8673 Personal history of transient ischemic attack (TIA), and cerebral infarction without residual deficits: Secondary | ICD-10-CM | POA: Diagnosis not present

## 2017-09-19 DIAGNOSIS — M4854XA Collapsed vertebra, not elsewhere classified, thoracic region, initial encounter for fracture: Secondary | ICD-10-CM | POA: Insufficient documentation

## 2017-09-19 DIAGNOSIS — E119 Type 2 diabetes mellitus without complications: Secondary | ICD-10-CM | POA: Diagnosis not present

## 2017-09-19 DIAGNOSIS — Z419 Encounter for procedure for purposes other than remedying health state, unspecified: Secondary | ICD-10-CM

## 2017-09-19 DIAGNOSIS — Z794 Long term (current) use of insulin: Secondary | ICD-10-CM | POA: Insufficient documentation

## 2017-09-19 HISTORY — PX: KYPHOPLASTY: SHX5884

## 2017-09-19 LAB — GLUCOSE, CAPILLARY
GLUCOSE-CAPILLARY: 202 mg/dL — AB (ref 65–99)
GLUCOSE-CAPILLARY: 225 mg/dL — AB (ref 65–99)
GLUCOSE-CAPILLARY: 194 mg/dL — AB (ref 65–99)
Glucose-Capillary: 220 mg/dL — ABNORMAL HIGH (ref 65–99)

## 2017-09-19 SURGERY — KYPHOPLASTY
Anesthesia: Monitor Anesthesia Care

## 2017-09-19 MED ORDER — FENTANYL CITRATE (PF) 100 MCG/2ML IJ SOLN
INTRAMUSCULAR | Status: AC
  Administered 2017-09-19: 25 ug via INTRAVENOUS
  Filled 2017-09-19: qty 2

## 2017-09-19 MED ORDER — MIDAZOLAM HCL 2 MG/2ML IJ SOLN
INTRAMUSCULAR | Status: AC
  Filled 2017-09-19: qty 2

## 2017-09-19 MED ORDER — MIDAZOLAM HCL 2 MG/2ML IJ SOLN
INTRAMUSCULAR | Status: DC | PRN
  Administered 2017-09-19: 1 mg via INTRAVENOUS

## 2017-09-19 MED ORDER — LIDOCAINE HCL (PF) 2 % IJ SOLN
INTRAMUSCULAR | Status: AC
  Filled 2017-09-19: qty 10

## 2017-09-19 MED ORDER — CARVEDILOL 12.5 MG PO TABS
ORAL_TABLET | ORAL | Status: AC
  Filled 2017-09-19: qty 1

## 2017-09-19 MED ORDER — IOPAMIDOL (ISOVUE-M 200) INJECTION 41%
INTRAMUSCULAR | Status: DC | PRN
  Administered 2017-09-19: 40 mL

## 2017-09-19 MED ORDER — CEFAZOLIN SODIUM-DEXTROSE 2-4 GM/100ML-% IV SOLN
2.0000 g | Freq: Once | INTRAVENOUS | Status: AC
  Administered 2017-09-19: 2 g via INTRAVENOUS

## 2017-09-19 MED ORDER — LIDOCAINE HCL (CARDIAC) 20 MG/ML IV SOLN
INTRAVENOUS | Status: DC | PRN
  Administered 2017-09-19: 60 mg via INTRAVENOUS

## 2017-09-19 MED ORDER — KETAMINE HCL 50 MG/ML IJ SOLN
INTRAMUSCULAR | Status: AC
  Filled 2017-09-19: qty 10

## 2017-09-19 MED ORDER — PROPOFOL 500 MG/50ML IV EMUL
INTRAVENOUS | Status: DC | PRN
  Administered 2017-09-19: 50 ug/kg/min via INTRAVENOUS

## 2017-09-19 MED ORDER — METOCLOPRAMIDE HCL 5 MG/ML IJ SOLN: 5.0000 mg | Freq: Three times a day (TID) | INTRAMUSCULAR | Status: DC | PRN

## 2017-09-19 MED ORDER — KETOROLAC TROMETHAMINE 30 MG/ML IJ SOLN
INTRAMUSCULAR | Status: AC
  Filled 2017-09-19: qty 1

## 2017-09-19 MED ORDER — FENTANYL CITRATE (PF) 100 MCG/2ML IJ SOLN
25.0000 ug | INTRAMUSCULAR | Status: DC | PRN
  Administered 2017-09-19 (×4): 25 ug via INTRAVENOUS

## 2017-09-19 MED ORDER — SODIUM CHLORIDE 0.9 % IV SOLN
INTRAVENOUS | Status: DC
  Administered 2017-09-19: 13:00:00 via INTRAVENOUS

## 2017-09-19 MED ORDER — KETAMINE HCL 50 MG/ML IJ SOLN
INTRAMUSCULAR | Status: DC | PRN
  Administered 2017-09-19 (×2): 10 mg via INTRAMUSCULAR
  Administered 2017-09-19: 5 mg via INTRAMUSCULAR

## 2017-09-19 MED ORDER — HYDROCODONE-ACETAMINOPHEN 5-325 MG PO TABS: 1.0000 | ORAL_TABLET | Freq: Four times a day (QID) | ORAL | 0 refills | Status: AC | PRN

## 2017-09-19 MED ORDER — ONDANSETRON HCL 4 MG/2ML IJ SOLN: 4.0000 mg | Freq: Once | INTRAMUSCULAR | Status: DC | PRN

## 2017-09-19 MED ORDER — ONDANSETRON HCL 4 MG/2ML IJ SOLN
INTRAMUSCULAR | Status: AC
  Filled 2017-09-19: qty 2

## 2017-09-19 MED ORDER — INSULIN ASPART 100 UNIT/ML ~~LOC~~ SOLN
SUBCUTANEOUS | Status: AC
  Administered 2017-09-19: 5 [IU] via SUBCUTANEOUS
  Filled 2017-09-19: qty 1

## 2017-09-19 MED ORDER — CEFAZOLIN SODIUM-DEXTROSE 2-4 GM/100ML-% IV SOLN
INTRAVENOUS | Status: AC
  Filled 2017-09-19: qty 100

## 2017-09-19 MED ORDER — SODIUM CHLORIDE 0.9 % IV SOLN: INTRAVENOUS | Status: DC

## 2017-09-19 MED ORDER — ONDANSETRON HCL 4 MG/2ML IJ SOLN: 4.0000 mg | Freq: Four times a day (QID) | INTRAMUSCULAR | Status: DC | PRN

## 2017-09-19 MED ORDER — CARVEDILOL 12.5 MG PO TABS
6.2500 mg | ORAL_TABLET | Freq: Two times a day (BID) | ORAL | Status: DC
  Administered 2017-09-19: 6.25 mg via ORAL

## 2017-09-19 MED ORDER — BUPIVACAINE-EPINEPHRINE (PF) 0.5% -1:200000 IJ SOLN
INTRAMUSCULAR | Status: DC | PRN
  Administered 2017-09-19: 20 mL via PERINEURAL

## 2017-09-19 MED ORDER — INSULIN ASPART 100 UNIT/ML ~~LOC~~ SOLN
5.0000 [IU] | Freq: Once | SUBCUTANEOUS | Status: AC
  Administered 2017-09-19: 5 [IU] via SUBCUTANEOUS

## 2017-09-19 MED ORDER — ONDANSETRON HCL 4 MG PO TABS: 4.0000 mg | ORAL_TABLET | Freq: Four times a day (QID) | ORAL | Status: DC | PRN

## 2017-09-19 MED ORDER — LIDOCAINE HCL 1 % IJ SOLN
INTRAMUSCULAR | Status: DC | PRN
  Administered 2017-09-19: 30 mL

## 2017-09-19 MED ORDER — METOCLOPRAMIDE HCL 10 MG PO TABS: 5.0000 mg | ORAL_TABLET | Freq: Three times a day (TID) | ORAL | Status: DC | PRN

## 2017-09-19 MED ORDER — HYDROCODONE-ACETAMINOPHEN 5-325 MG PO TABS: 1.0000 | ORAL_TABLET | ORAL | Status: DC | PRN

## 2017-09-19 MED ORDER — DEXTROSE 5 % IV SOLN: 2000.0000 mg | Freq: Once | INTRAVENOUS | Status: DC

## 2017-09-19 MED ORDER — ONDANSETRON HCL 4 MG/2ML IJ SOLN
INTRAMUSCULAR | Status: DC | PRN
  Administered 2017-09-19: 4 mg via INTRAVENOUS

## 2017-09-19 MED ORDER — PROPOFOL 500 MG/50ML IV EMUL
INTRAVENOUS | Status: AC
  Filled 2017-09-19: qty 50

## 2017-09-19 SURGICAL SUPPLY — 17 items
ADH SKN CLS APL DERMABOND .7 (GAUZE/BANDAGES/DRESSINGS) ×1
CEMENT KYPHON CX01A KIT/MIXER (Cement) ×3 IMPLANT
DERMABOND ADVANCED (GAUZE/BANDAGES/DRESSINGS) ×2
DERMABOND ADVANCED .7 DNX12 (GAUZE/BANDAGES/DRESSINGS) ×1 IMPLANT
DEVICE BIOPSY BONE KYPHX (INSTRUMENTS) ×3 IMPLANT
DRAPE C-ARM XRAY 36X54 (DRAPES) ×3 IMPLANT
DURAPREP 26ML APPLICATOR (WOUND CARE) ×3 IMPLANT
GLOVE SURG SYN 9.0  PF PI (GLOVE) ×2
GLOVE SURG SYN 9.0 PF PI (GLOVE) ×1 IMPLANT
GOWN SRG 2XL LVL 4 RGLN SLV (GOWNS) ×1 IMPLANT
GOWN STRL NON-REIN 2XL LVL4 (GOWNS) ×3
GOWN STRL REUS W/ TWL LRG LVL3 (GOWN DISPOSABLE) ×1 IMPLANT
GOWN STRL REUS W/TWL LRG LVL3 (GOWN DISPOSABLE) ×3
PACK KYPHOPLASTY (MISCELLANEOUS) ×3 IMPLANT
STRAP SAFETY BODY (MISCELLANEOUS) ×3 IMPLANT
TRAY KYPHOPAK 15/3 EXPRESS 1ST (MISCELLANEOUS) ×3 IMPLANT
TRAY KYPHOPAK 20/3 EXPRESS 1ST (MISCELLANEOUS) ×1 IMPLANT

## 2017-09-19 NOTE — Op Note (Signed)
09/19/2017  2:08 PM  PATIENT:  Margaret Fischer  77 y.o. female  PRE-OPERATIVE DIAGNOSIS:  t12 and t11 compression fracture  POST-OPERATIVE DIAGNOSIS:  t12 and t11 compression fracture  PROCEDURE:  Procedure(s): KYPHOPLASTY-T12 (N/A)  SURGEON: Laurene Footman, MD  ASSISTANTS: none  ANESTHESIA:   local and MAC  EBL:  Total I/O In: 700 [I.V.:700] Out: 1 [Blood:1]  BLOOD ADMINISTERED:none  DRAINS: none   LOCAL MEDICATIONS USED:  MARCAINE    and XYLOCAINE   SPECIMEN:  Source of Specimen:  T 11  DISPOSITION OF SPECIMEN:  PATHOLOGY  COUNTS:  YES  TOURNIQUET:  * No tourniquets in log *  IMPLANTS: Bone cement  DICTATION: .Dragon Dictation   patient was brought to the operating room and after adequate sedation was given the patient was placed prone. c-arm was brought in and very good visualization of the T11 and T 12 compression fractures were obtained in both AP and lateral projections. After appropriate patient identification and timeout procedures were completed, local anesthetic was infiltrated subcutaneously on right  side at T12 and on the left side at T11. The back was then prepped and draped in the usual sterile fashion and repeat timeout procedure carried out. A spinal needle was used to get 20 cc of local anesthetic down to the pedicle on right side with 10 cc 1% Xylocaine 10 cc half percent Sensorcaine with epinephrine and the same amount on the left side at T11 after allowing that to set up a small incision made on the right side the biopsy across the midline a perpendicular approach was utilized to getting into the vertebral body. Care was taken to stay lateral to the medial wall of the pedicle as the trocar was advanced on the right side. Biopsy was not obtained despite attempt.  Identical procedure carried out on the left at T11 with biopsy obtaineddilling was carried out followed by inflation of the balloon to 4 cc with partial correction of the compression deformity  at each level. When the cement was the appropriate consistency the balloon was let down and 4.5 cc of cement was placed getting good fill of the vertebral body left to right side superior to inferior with some extravasation into the veins at T12.  After the cement was set the trocar was removed and permanent C-arm views were obtained. The wounds were closed with Dermabond and covered with a Band-Aid   PLAN OF CARE: Discharge to home after PACU  PATIENT DISPOSITION:  PACU - hemodynamically stable.

## 2017-09-19 NOTE — Transfer of Care (Signed)
Immediate Anesthesia Transfer of Care Note  Patient: Margaret Fischer  Procedure(s) Performed: Coralyn Helling (N/A )  Patient Location: PACU  Anesthesia Type:MAC  Level of Consciousness: awake and sedated  Airway & Oxygen Therapy: Patient Spontanous Breathing and Patient connected to face mask oxygen  Post-op Assessment: Report given to RN and Post -op Vital signs reviewed and stable  Post vital signs: Reviewed and stable  Last Vitals:  Vitals:   09/19/17 1028  BP: (!) 155/67  Pulse: 74  Resp: 18  Temp: 36.5 C  SpO2: 95%    Last Pain:  Vitals:   09/19/17 1028  TempSrc: Oral  PainSc: 8          Complications: No apparent anesthesia complications

## 2017-09-19 NOTE — Discharge Instructions (Addendum)
Remove Band-Aids on Saturday.  Take it easy today and tomorrow and resume normal activities on Saturday.  pain medicine as directed    AMBULATORY SURGERY  DISCHARGE INSTRUCTIONS   1) The drugs that you were given will stay in your system until tomorrow so for the next 24 hours you should not:  A) Drive an automobile B) Make any legal decisions C) Drink any alcoholic beverage   2) You may resume regular meals tomorrow.  Today it is better to start with liquids and gradually work up to solid foods.  You may eat anything you prefer, but it is better to start with liquids, then soup and crackers, and gradually work up to solid foods.   3) Please notify your doctor immediately if you have any unusual bleeding, trouble breathing, redness and pain at the surgery site, drainage, fever, or pain not relieved by medication.    4) Additional Instructions:        Please contact your physician with any problems or Same Day Surgery at [phone], Monday through Friday 6 am to 4 pm, or [place] at [facility] number at [phone].

## 2017-09-19 NOTE — OR Nursing (Signed)
Discussed vs and pt advising no coreg x 2 days because she was out of the med.  Coreg 6.25mg  given preop as ordered.

## 2017-09-19 NOTE — H&P (Signed)
Reviewed paper H+P, will be scanned into chart. No changes noted.  

## 2017-09-19 NOTE — Anesthesia Postprocedure Evaluation (Signed)
Anesthesia Post Note  Patient: Margaret Fischer  Procedure(s) Performed: KYPHOPLASTY-T12 (N/A )  Patient location during evaluation: PACU Anesthesia Type: MAC Level of consciousness: awake and alert Pain management: pain level controlled Vital Signs Assessment: post-procedure vital signs reviewed and stable Respiratory status: spontaneous breathing, nonlabored ventilation, respiratory function stable and patient connected to nasal cannula oxygen Cardiovascular status: stable and blood pressure returned to baseline Postop Assessment: no apparent nausea or vomiting Anesthetic complications: no     Last Vitals:  Vitals:   09/19/17 1529 09/19/17 1542  BP: 137/71 (!) 165/54  Pulse: (!) 57 63  Resp: 10 16  Temp: 36.5 C 36.7 C  SpO2: 98% 98%    Last Pain:  Vitals:   09/19/17 1529  TempSrc:   PainSc: [name]

## 2017-09-19 NOTE — OR Nursing (Signed)
Discussed discharge instructions with pt and family. Both voice understanding. 

## 2017-09-19 NOTE — H&P (Signed)
Additional level at T 11 showed on MRI and added to procedure.

## 2017-09-19 NOTE — Anesthesia Preprocedure Evaluation (Signed)
Anesthesia Evaluation  Patient identified by MRN, date of birth, ID band Patient awake    Reviewed: Allergy & Precautions, NPO status , Patient's Chart, lab work & pertinent test results, reviewed documented beta blocker date and time   Airway Mallampati: III  TM Distance: >3 FB     Dental  (+) Chipped   Pulmonary former smoker,           Cardiovascular hypertension, Pt. on medications and Pt. on home beta blockers +CHF       Neuro/Psych  Headaches, PSYCHIATRIC DISORDERS Anxiety Depression  Neuromuscular disease CVA    GI/Hepatic   Endo/Other  diabetes, Type 2  Renal/GU      Musculoskeletal  (+) Fibromyalgia -  Abdominal   Peds  Hematology   Anesthesia Other Findings Runs a low Sat.  Reproductive/Obstetrics                             Anesthesia Physical Anesthesia Plan  ASA: III  Anesthesia Plan: MAC   Post-op Pain Management:    Induction:   PONV Risk Score and Plan:   Airway Management Planned:   Additional Equipment:   Intra-op Plan:   Post-operative Plan:   Informed Consent: I have reviewed the patients History and Physical, chart, labs and discussed the procedure including the risks, benefits and alternatives for the proposed anesthesia with the patient or authorized representative who has indicated his/her understanding and acceptance.     Plan Discussed with: CRNA  Anesthesia Plan Comments:         Anesthesia Quick Evaluation

## 2017-09-19 NOTE — Anesthesia Post-op Follow-up Note (Signed)
Anesthesia QCDR form completed.        

## 2017-09-24 LAB — SURGICAL PATHOLOGY

## 2017-10-04 ENCOUNTER — Other Ambulatory Visit (HOSPITAL_COMMUNITY): Payer: Self-pay | Admitting: Orthopedic Surgery

## 2017-10-04 DIAGNOSIS — Z9889 Other specified postprocedural states: Secondary | ICD-10-CM

## 2017-10-04 DIAGNOSIS — S32010A Wedge compression fracture of first lumbar vertebra, initial encounter for closed fracture: Secondary | ICD-10-CM

## 2017-10-15 ENCOUNTER — Ambulatory Visit
Admission: RE | Admit: 2017-10-15 | Discharge: 2017-10-15 | Disposition: A | Discharge status: Home / Self Care | Payer: Medicare Other | Source: Ambulatory Visit | Attending: Orthopedic Surgery | Admitting: Orthopedic Surgery

## 2017-10-15 ENCOUNTER — Other Ambulatory Visit (HOSPITAL_COMMUNITY): Payer: Medicare Other

## 2017-10-15 DIAGNOSIS — S32010A Wedge compression fracture of first lumbar vertebra, initial encounter for closed fracture: Secondary | ICD-10-CM | POA: Diagnosis not present

## 2017-10-15 DIAGNOSIS — Z9889 Other specified postprocedural states: Secondary | ICD-10-CM | POA: Diagnosis not present

## 2017-10-15 DIAGNOSIS — X58XXXA Exposure to other specified factors, initial encounter: Secondary | ICD-10-CM | POA: Insufficient documentation

## 2018-03-11 IMAGING — CT CT ABD-PELV W/ CM
2 of 5 series · 16 of 46 positions shown, 18 images · IV contrast (APPLIED)
Comparison: CT abdomen and pelvis 04/03/2011.

CLINICAL DATA: Left flank and left lower quadrant pain for 3 days.
The patient reports her last bowel movement was 3 days ago.

EXAM:
CT ABDOMEN AND PELVIS WITH CONTRAST
TECHNIQUE: Multidetector CT imaging of the abdomen and pelvis was performed
using the standard protocol following bolus administration of
intravenous contrast.
CONTRAST:  100 ml [id number] IOPAMIDOL ([id number]) INJECTION 61%

[Series 2: axial st · axial · 0.98mm/px · z∈[-1067,-647]mm · 13 of 94 slices shown, 15 images]
[im 5/94  soft-tissue]
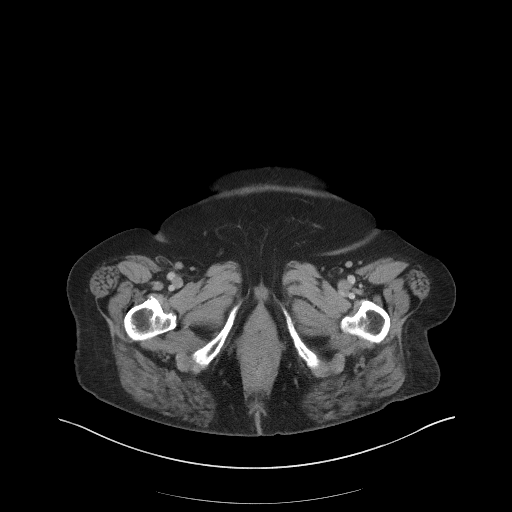
[im 5/94  bone]
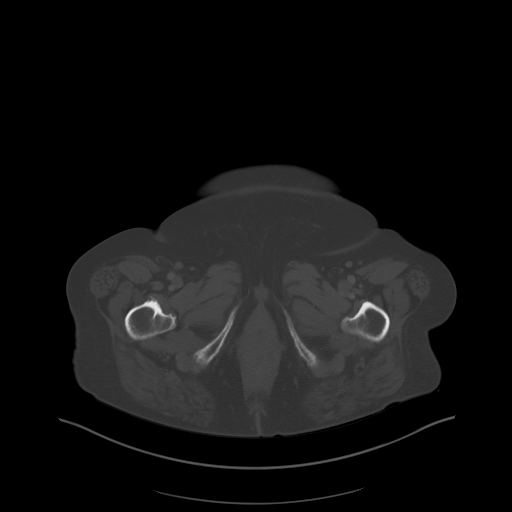
[im 14/94  soft-tissue]
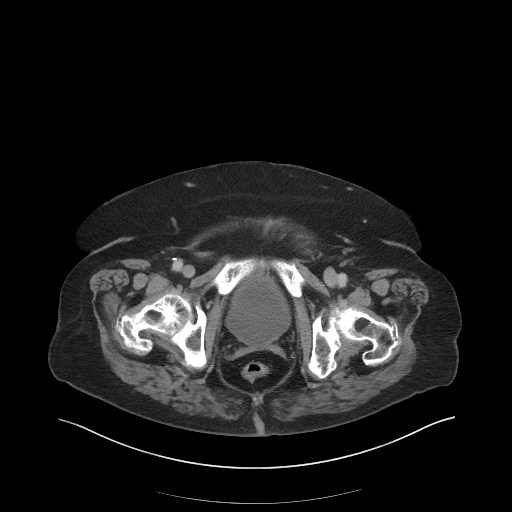
[im 19/94  soft-tissue]
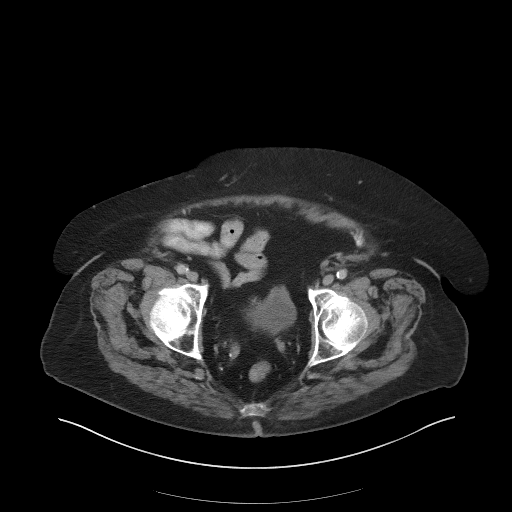
[im 28/94  soft-tissue]
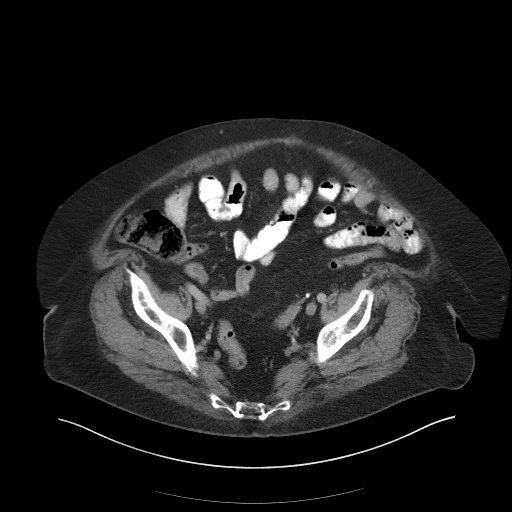
[im 33/94  soft-tissue]
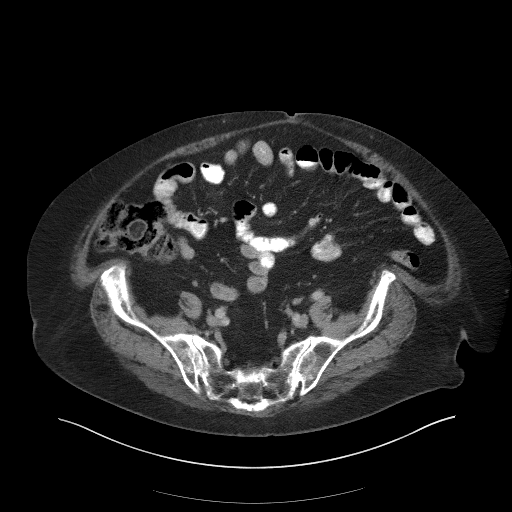
[im 42/94  soft-tissue]
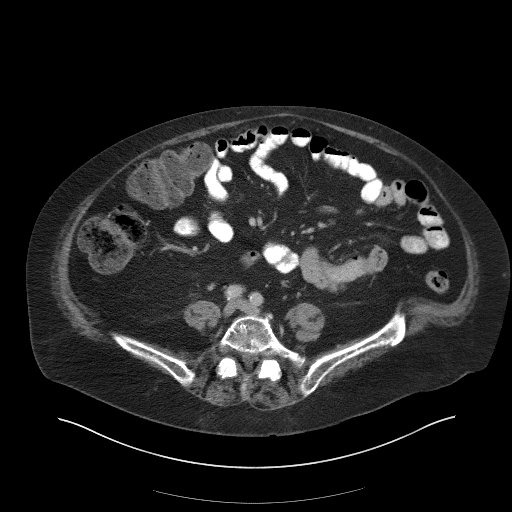
[im 47/94  soft-tissue]
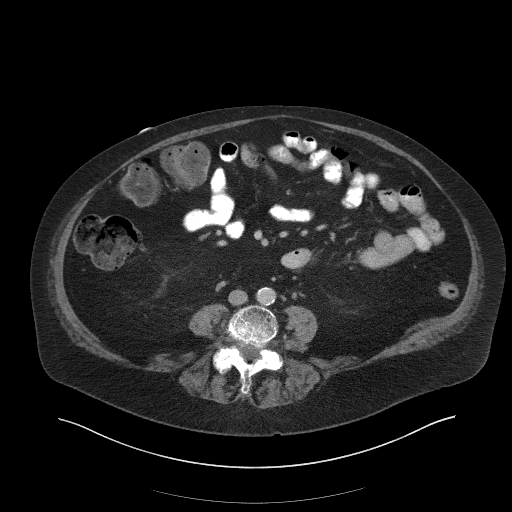
[im 52/94  soft-tissue]
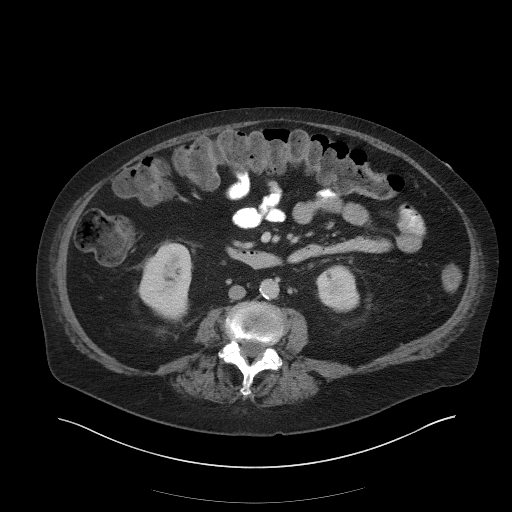
[im 61/94  soft-tissue]
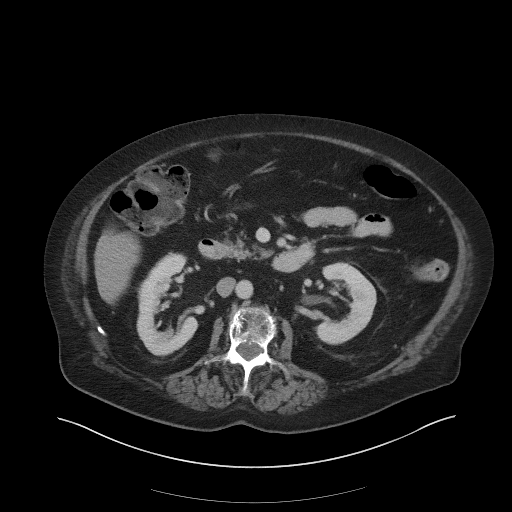
[im 61/94  bone]
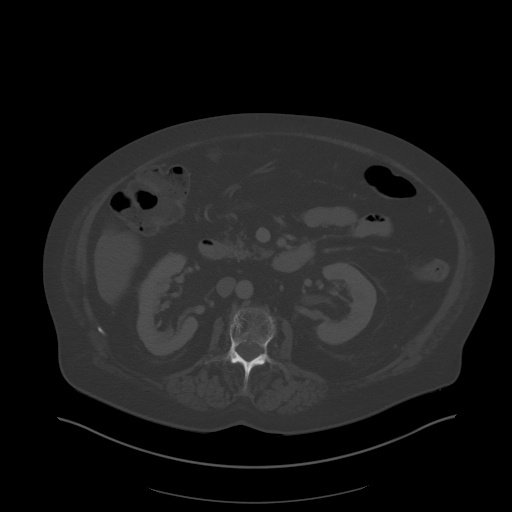
[im 66/94  soft-tissue]
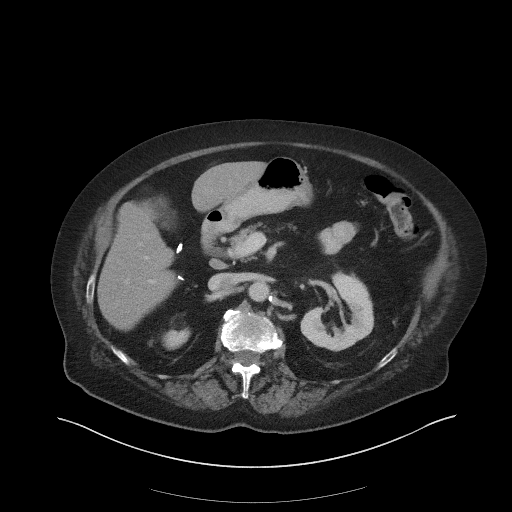
[im 75/94  soft-tissue]
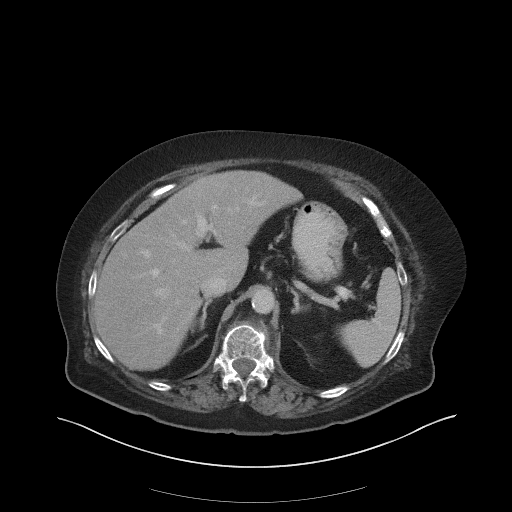
[im 80/94  soft-tissue]
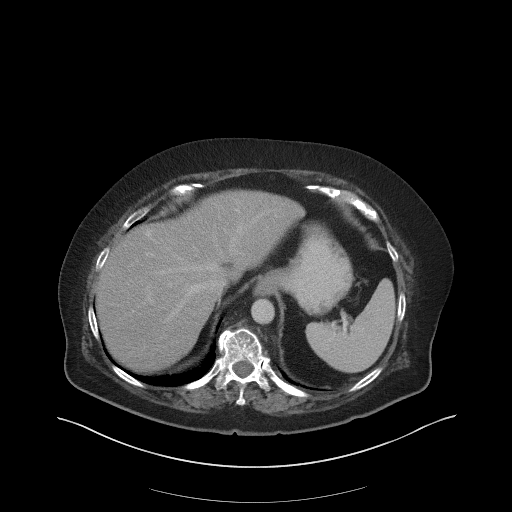
[im 89/94  soft-tissue]
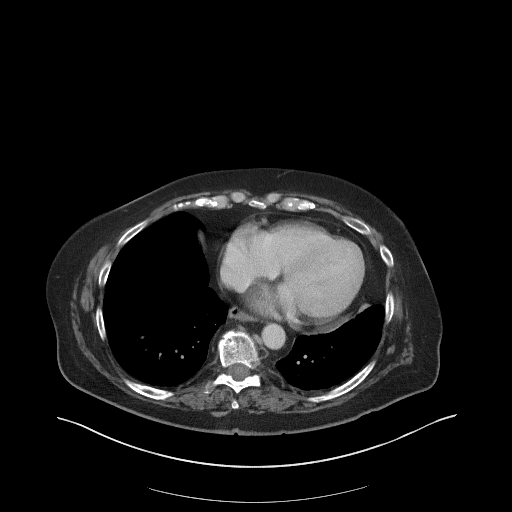

[Series 5: coronal st · coronal · 0.85mm/px · 3 of 105 slices shown]
[im 35/105  soft-tissue]
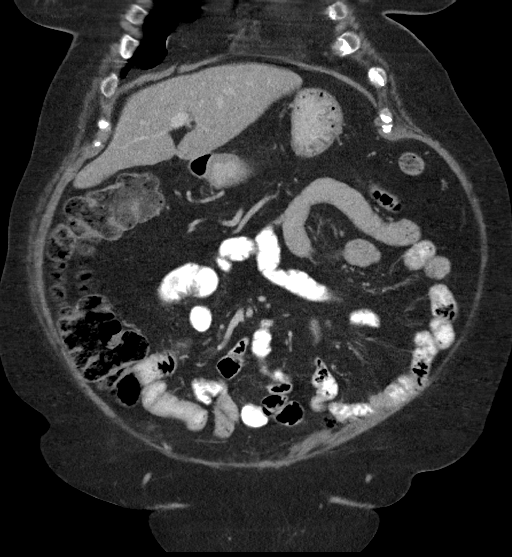
[im 47/105  soft-tissue]
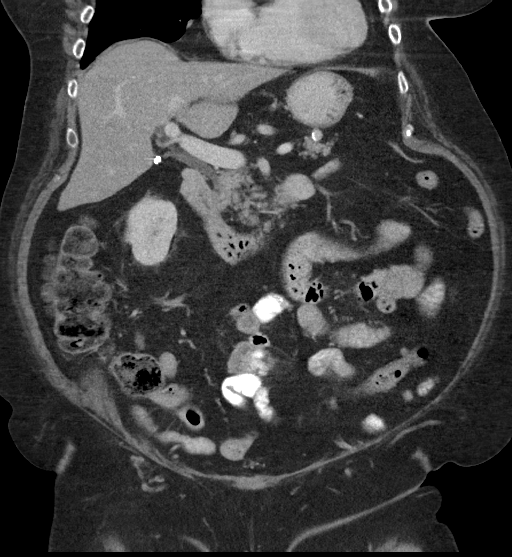
[im 58/105  soft-tissue]
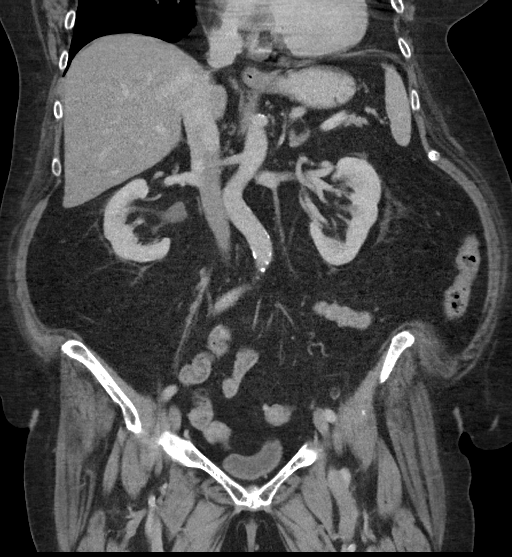

[16 of 46 positions shown; findings below may reference images not displayed]

FINDINGS: Lower chest: Heart size is mildly enlarged. No pleural or
pericardial effusion. Lung bases demonstrate some dependent
atelectasis.

Hepatobiliary: The gallbladder has been removed. The liver is
somewhat low attenuating compatible with fatty infiltration. No
focal liver lesion. Biliary tree is unremarkable.

Pancreas: Unremarkable. No pancreatic ductal dilatation or
surrounding inflammatory changes.

Spleen: Normal in size without focal abnormality.

Adrenals/Urinary Tract: 1.8 cm in diameter fatty lesion in the
superior pole of the right kidney is consistent with an
angiomyolipoma. The lesion is present on the prior CT where it
measured 2.4 cm in diameter. Small left renal cyst is seen. The
kidneys are otherwise unremarkable. Ureters and urinary bladder
appear normal.

Stomach/Bowel: The appendix has been removed. The stomach and small
and large bowel appear normal.

Vascular/Lymphatic: Aortic atherosclerosis. No enlarged abdominal or
pelvic lymph nodes.

Reproductive: Status post hysterectomy. No adnexal masses.

Other: No ascites.  No hernia.

Musculoskeletal: The patient has a mild inferior endplate
compression fracture of T12 with vertebral body height loss
centrally of 20-30%. Fracture lines are visible suggestive of acute
or subacute injury. Minimal bony retropulsion off the inferior
endplate of T12 is noted.
IMPRESSION: Inferior endplate compression fracture of T12 with vertebral body
height loss of 20-30% appears acute or subacute but cannot be
definitively characterized. Minimal bony retropulsion off the
inferior endplate is identified but no central canal or foraminal
narrowing is seen. No other acute abnormality is identified.

Mild fatty infiltration of the liver.

Benign angiomyolipoma superior pole right kidney.

Atherosclerosis.

## 2018-03-17 IMAGING — XA DG C-ARM 61-120 MIN
1 series · 1 of 1 positions shown · non-contrast
Comparison: None.

CLINICAL DATA: Kyphoplasty.

FLUOROSCOPY TIME:  2 minutes and 4 seconds.
Images: 2
EXAM:
DG C-ARM 61-120 MIN

[Series 1: ortho standard · 1 of 1 slices shown]
[im 1/1]
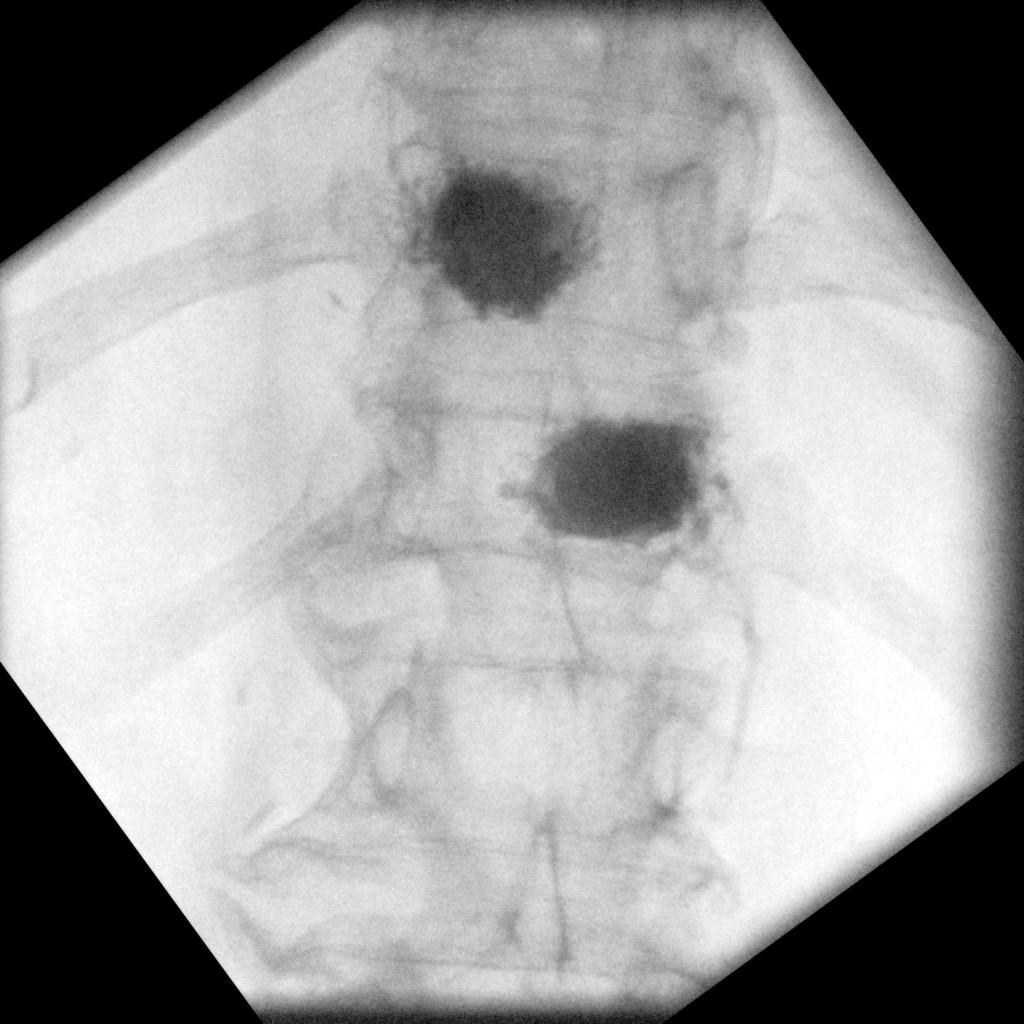

[1 of 1 positions shown; findings below may reference images not displayed]

FINDINGS: The patient is status post kyphoplasty at T11 and T12.
IMPRESSION: The patient is status post kyphoplasty at T11 and T12.
